# Patient Record
Sex: Female | Born: 1955 | Race: White | Hispanic: No | Marital: Married | Smoking: Former smoker
Health system: Southern US, Community
[De-identification: ages and names within clinical notes are randomized; demographics above are authoritative.]

## PROBLEM LIST (undated history)

## (undated) DIAGNOSIS — E039 Hypothyroidism, unspecified: Secondary | ICD-10-CM

## (undated) DIAGNOSIS — K219 Gastro-esophageal reflux disease without esophagitis: Secondary | ICD-10-CM

## (undated) DIAGNOSIS — E559 Vitamin D deficiency, unspecified: Secondary | ICD-10-CM

## (undated) DIAGNOSIS — I10 Essential (primary) hypertension: Secondary | ICD-10-CM

## (undated) HISTORY — DX: Essential (primary) hypertension: I10

## (undated) HISTORY — DX: Hypothyroidism, unspecified: E03.9

## (undated) HISTORY — DX: Gastro-esophageal reflux disease without esophagitis: K21.9

## (undated) HISTORY — PX: NO PAST SURGERIES: SHX2092

## (undated) HISTORY — DX: Vitamin D deficiency, unspecified: E55.9

---

## 1998-05-02 ENCOUNTER — Ambulatory Visit (HOSPITAL_COMMUNITY): Admission: RE | Admit: 1998-05-02 | Discharge: 1998-05-02 | Payer: Self-pay | Admitting: Otolaryngology

## 2005-05-14 ENCOUNTER — Ambulatory Visit: Payer: Self-pay | Admitting: Family Medicine

## 2005-06-23 ENCOUNTER — Other Ambulatory Visit: Admission: RE | Admit: 2005-06-23 | Discharge: 2005-06-23 | Payer: Self-pay | Admitting: Family Medicine

## 2005-06-23 ENCOUNTER — Ambulatory Visit: Payer: Self-pay | Admitting: Family Medicine

## 2006-07-21 ENCOUNTER — Ambulatory Visit: Payer: Self-pay | Admitting: Family Medicine

## 2006-07-21 ENCOUNTER — Encounter: Payer: Self-pay | Admitting: Family Medicine

## 2006-07-21 ENCOUNTER — Other Ambulatory Visit: Admission: RE | Admit: 2006-07-21 | Discharge: 2006-07-21 | Payer: Self-pay | Admitting: Family Medicine

## 2007-07-04 ENCOUNTER — Encounter: Payer: Self-pay | Admitting: Family Medicine

## 2007-07-04 DIAGNOSIS — J301 Allergic rhinitis due to pollen: Secondary | ICD-10-CM | POA: Insufficient documentation

## 2007-07-04 DIAGNOSIS — M797 Fibromyalgia: Secondary | ICD-10-CM

## 2007-07-04 DIAGNOSIS — M5137 Other intervertebral disc degeneration, lumbosacral region: Secondary | ICD-10-CM | POA: Insufficient documentation

## 2007-07-04 DIAGNOSIS — E039 Hypothyroidism, unspecified: Secondary | ICD-10-CM

## 2007-08-01 ENCOUNTER — Ambulatory Visit: Payer: Self-pay | Admitting: Family Medicine

## 2007-08-01 ENCOUNTER — Encounter: Payer: Self-pay | Admitting: Family Medicine

## 2007-08-01 ENCOUNTER — Other Ambulatory Visit: Admission: RE | Admit: 2007-08-01 | Discharge: 2007-08-01 | Payer: Self-pay | Admitting: Family Medicine

## 2007-08-02 LAB — CONVERTED CEMR LAB
ALT: 20 units/L (ref 0–35)
AST: 23 units/L (ref 0–37)
Albumin: 4.1 g/dL (ref 3.5–5.2)
Alkaline Phosphatase: 77 units/L (ref 39–117)
Basophils Relative: 0.6 % (ref 0.0–1.0)
Creatinine, Ser: 0.7 mg/dL (ref 0.4–1.2)
GFR calc non Af Amer: 93 mL/min
Glucose, Bld: 92 mg/dL (ref 70–99)
Hemoglobin: 14.4 g/dL (ref 12.0–15.0)
Lymphocytes Relative: 26.6 % (ref 12.0–46.0)
MCV: 90.6 fL (ref 78.0–100.0)
Monocytes Relative: 6.9 % (ref 3.0–11.0)
Potassium: 3.7 meq/L (ref 3.5–5.1)
RBC: 4.49 M/uL (ref 3.87–5.11)
RDW: 12.3 % (ref 11.5–14.6)
Sodium: 140 meq/L (ref 135–145)
Total Bilirubin: 0.8 mg/dL (ref 0.3–1.2)
Total CHOL/HDL Ratio: 2.7
VLDL: 23 mg/dL (ref 0–40)

## 2007-08-03 ENCOUNTER — Encounter (INDEPENDENT_AMBULATORY_CARE_PROVIDER_SITE_OTHER): Payer: Self-pay | Admitting: *Deleted

## 2007-08-08 ENCOUNTER — Telehealth: Payer: Self-pay | Admitting: Family Medicine

## 2007-08-17 ENCOUNTER — Telehealth: Payer: Self-pay | Admitting: Family Medicine

## 2008-02-16 ENCOUNTER — Telehealth: Payer: Self-pay | Admitting: Family Medicine

## 2008-07-25 ENCOUNTER — Ambulatory Visit: Payer: Self-pay | Admitting: Family Medicine

## 2008-07-30 LAB — CONVERTED CEMR LAB: T3 Uptake Ratio: 40.4 % — ABNORMAL HIGH (ref 22.5–37.0)

## 2009-07-24 ENCOUNTER — Ambulatory Visit: Payer: Self-pay | Admitting: Family Medicine

## 2009-07-26 ENCOUNTER — Encounter (INDEPENDENT_AMBULATORY_CARE_PROVIDER_SITE_OTHER): Payer: Self-pay | Admitting: *Deleted

## 2009-07-26 LAB — CONVERTED CEMR LAB
Direct LDL: 90.9 mg/dL
Free T4: 0.9 ng/dL (ref 0.6–1.6)
HDL: 57.3 mg/dL (ref >39.00)
TSH: 3.49 microintl units/mL (ref 0.35–5.50)

## 2009-10-30 ENCOUNTER — Ambulatory Visit: Payer: Self-pay | Admitting: Family Medicine

## 2009-10-30 LAB — CONVERTED CEMR LAB: Yeast, UA: 0

## 2009-11-22 ENCOUNTER — Ambulatory Visit: Payer: Self-pay | Admitting: Family Medicine

## 2010-07-23 ENCOUNTER — Ambulatory Visit: Payer: Self-pay | Admitting: Family Medicine

## 2010-07-23 LAB — CONVERTED CEMR LAB
ALT: 19 units/L (ref 0–35)
AST: 24 units/L (ref 0–37)
Basophils Absolute: 0.1 10*3/uL (ref 0.0–0.1)
Basophils Relative: 0.7 % (ref 0.0–3.0)
CO2: 31 meq/L (ref 19–32)
Calcium: 9.8 mg/dL (ref 8.4–10.5)
Creatinine, Ser: 0.9 mg/dL (ref 0.4–1.2)
Free T4: 0.75 ng/dL (ref 0.60–1.60)
GFR calc non Af Amer: 70.76 mL/min (ref >60)
Hemoglobin: 13.5 g/dL (ref 12.0–15.0)
MCHC: 34.3 g/dL (ref 30.0–36.0)
MCV: 91.3 fL (ref 78.0–100.0)
Monocytes Relative: 6.3 % (ref 3.0–12.0)
RBC: 4.33 M/uL (ref 3.87–5.11)

## 2010-09-11 ENCOUNTER — Ambulatory Visit: Payer: Self-pay | Admitting: Family Medicine

## 2010-09-11 ENCOUNTER — Encounter: Payer: Self-pay | Admitting: Family Medicine

## 2010-09-11 DIAGNOSIS — R002 Palpitations: Secondary | ICD-10-CM | POA: Insufficient documentation

## 2010-09-16 LAB — CONVERTED CEMR LAB
CO2: 29 meq/L (ref 19–32)
Cholesterol: 220 mg/dL — ABNORMAL HIGH (ref 0–200)
Creatinine, Ser: 0.8 mg/dL (ref 0.4–1.2)
Free T4: 0.98 ng/dL (ref 0.60–1.60)
Glucose, Bld: 90 mg/dL (ref 70–99)
HDL: 57.2 mg/dL (ref >39.00)
Lymphocytes Relative: 30.3 % (ref 12.0–46.0)
Lymphs Abs: 1.8 10*3/uL (ref 0.7–4.0)
MCHC: 34.8 g/dL (ref 30.0–36.0)
MCV: 91.4 fL (ref 78.0–100.0)
Monocytes Relative: 7.8 % (ref 3.0–12.0)
Platelets: 268 10*3/uL (ref 150.0–400.0)
RBC: 4.42 M/uL (ref 3.87–5.11)
RDW: 14.1 % (ref 11.5–14.6)
TSH: 4.7 microintl units/mL (ref 0.35–5.50)
Total CHOL/HDL Ratio: 4
Triglycerides: 218 mg/dL — ABNORMAL HIGH (ref 0.0–149.0)

## 2010-09-25 ENCOUNTER — Telehealth: Payer: Self-pay | Admitting: Family Medicine

## 2010-09-25 ENCOUNTER — Ambulatory Visit: Payer: Self-pay | Admitting: Family Medicine

## 2010-09-26 ENCOUNTER — Ambulatory Visit: Payer: Self-pay | Admitting: Family Medicine

## 2010-11-04 NOTE — Assessment & Plan Note (Signed)
Summary: ?UTI,SINUS INFECTION/CLE   Vital Signs:  Patient profile:   55 year old female Weight:      185 pounds Temp:     98.9 degrees F oral Pulse rate:   84 / minute Pulse rhythm:   regular BP sitting:   132 / 100  Vitals Entered By: Lowella Petties CMA (October 30, 2009 12:35 PM) CC: Burning with urination, lower abd pain.  Also has sinus, ear and chest pain.  Cough.   History of Present Illness: is having urinary and sinus symptoms   uti started sat am  burns at the end of urination- pelvic heaviness and pain  some frequency  used cystex -- did not help much  is drinking cranberry juice tried azo last night   uri symptoms  did tx sinusitis in oct -- and never got fully better  now soreness R side neck glands and R ear pain - on and off  then some "noise " in ear- and tinnitus got much worse  this am - some post nasal drip and painful cough  no fever- no chills or aches  is really tired  green and yellow and bloody nasal d/c -- ever since early dec -- got a little better and then worse cannot breathe out of her nose cough is dry/ no wheeze   walking 2 mi every day   Allergies: 1)  Codeine 2)  * Nasonex  Past History:  Past Medical History: Last updated: 07/25/2008 Hypothyroidism fibromyalgia  all rhinitis deg disk dz   Family History: Last updated: 18-Aug-2009 Father: deceased age 12- MI, ETOH Mother: HTN, RA B- thalessemia, glaucoma, depression, rare kidney disease sister - same rare kidney disease Siblings: sister with HTN, depression uncle RA aunts B ca aunt cerebral hemm sister breast cancer   Social History: Last updated: 08/01/2007 Marital Status: Married Children: 1 daughter Occupation:  Former Smoker  Risk Factors: Smoking Status: quit (08/01/2007)  Review of Systems General:  Complains of fatigue and malaise; denies chills and fever. Eyes:  Denies blurring and discharge. ENT:  Complains of earache, nasal congestion, postnasal  drainage, sinus pressure, and sore throat. CV:  Denies chest pain or discomfort, lightheadness, and palpitations. Resp:  Complains of cough and shortness of breath; denies wheezing. GI:  Denies abdominal pain, change in bowel habits, and nausea. GU:  Complains of dysuria and urinary frequency; denies hematuria. Derm:  Denies itching and rash.  Physical Exam  General:  overweight but generally well appearing  Head:  normocephalic, atraumatic, and no abnormalities observed.  bilat maxillary and ethmoid sinus tenderness worse on R Eyes:  vision grossly intact, pupils equal, pupils round, and pupils reactive to light.  mild conj injection Ears:  R ear normal and L ear normal.   Nose:  nares are congested and injected bilat  Mouth:  pharynx pink and moist, no erythema, and no exudates.   Neck:  No deformities, masses, or tenderness noted. Lungs:  Normal respiratory effort, chest expands symmetrically. Lungs are clear to auscultation, no crackles or wheezes. Heart:  Normal rate and regular rhythm. S1 and S2 normal without gallop, murmur, click, rub or other extra sounds. Abdomen:  mild suprapubic tenderness without rebound or gaurding Msk:  no CVA tenderness  Skin:  Intact without suspicious lesions or rashes Cervical Nodes:  No lymphadenopathy noted Inguinal Nodes:  No significant adenopathy Psych:  normal affect, talkative and pleasant    Impression & Recommendations:  Problem # 1:  CYSTITIS (ICD-595.9) Assessment New with voiding  symptoms  tx with levaquin and update  pt advised to update me if symptoms worsen or do not improve - esp back pain or fever  The following medications were removed from the medication list:    Augmentin 875-125 Mg Tabs (Amoxicillin-pot clavulanate) .Marland Kitchen... 1 by mouth two times a day for 10 days Her updated medication list for this problem includes:    Levaquin 500 Mg Tabs (Levofloxacin) .Marland Kitchen... 1 by mouth once daily for 10 days for uti and sinus  infection  Orders: Prescription Created Electronically [phone]) UA Dipstick W/ Micro (manual) (60454)  Problem # 2:  SINUSITIS - ACUTE-NOS (ICD-461.9) Assessment: New ? if ever resolved from fall with chronic congestion  will tx with levaquin  recommend sympt care- see pt instructions   if not imp in 10 d will plan on ent consult The following medications were removed from the medication list:    Augmentin 875-125 Mg Tabs (Amoxicillin-pot clavulanate) .Marland Kitchen... 1 by mouth two times a day for 10 days Her updated medication list for this problem includes:    Nasonex 50 Mcg/act Susp (Mometasone furoate) .Marland Kitchen... 2 sprays in each nostril once daily    Levaquin 500 Mg Tabs (Levofloxacin) .Marland Kitchen... 1 by mouth once daily for 10 days for uti and sinus infection  Complete Medication List: 1)  Synthroid 100 Mcg Tabs (Levothyroxine sodium) .... One by mouth once daily 2)  Multivitamins Tabs (Multiple vitamin) .... Daily 3)  Vitamin D 500mg   .... Two daily 4)  Nasonex 50 Mcg/act Susp (Mometasone furoate) .... 2 sprays in each nostril once daily 5)  Levaquin 500 Mg Tabs (Levofloxacin) .Marland Kitchen.. 1 by mouth once daily for 10 days for uti and sinus infection  Patient Instructions: 1)  continue drinking lots of water 2)  call or seek care is symptoms don't improve in 2-3 days or if you develop back pain, nausea, or vomiting  3)  take the levaquin as directed  4)  use nasal saline spray  5)  mucinex can help with congestion 6)  if sinus symptoms are not better in 10 days- call and I will refer you to ENT doctor  Prescriptions: LEVAQUIN 500 MG TABS (LEVOFLOXACIN) 1 by mouth once daily for 10 days for uti and sinus infection  #10 x 0   Entered and Authorized by:   Judith Part MD   Signed by:   Judith Part MD on 10/30/2009   Method used:   Electronically to        [facility]* (retail)       [address] [place], [place]  [address]        Ph: [phone] or [phone]       Fax: [phone]   RxID:   [id number]   Prior Medications (reviewed today): SYNTHROID 100 MCG  TABS (LEVOTHYROXINE SODIUM) one by mouth once daily MULTIVITAMINS  TABS (MULTIPLE VITAMIN) daily VITAMIN D 500MG  () Two daily NASONEX 50 MCG/ACT SUSP (MOMETASONE FUROATE) 2 sprays in each nostril once daily Current Allergies: CODEINE * NASONEX Laboratory Results   Urine Tests   Date/Time Reported: October 30, 2009 12:39 PM   Urine Microscopic WBC/HPF: many RBC/HPF: 1-2 Bacteria/HPF: many Mucous/HPF: few Epithelial/HPF: 1-3 Crystals/HPF: 0 Casts/LPF: 0 Yeast/HPF: 0 Other: 0    Comments: Pt is taking AZO, strip is too discolored to read.

## 2010-11-04 NOTE — Assessment & Plan Note (Signed)
Summary: CHECK THYROID/CLE   Vital Signs:  Patient profile:   55 year old female Height:      68 inches Weight:      189.75 pounds BMI:     28.96 Temp:     98.2 degrees F oral Pulse rate:   72 / minute Pulse rhythm:   regular BP sitting:   110 / 74  (left arm) Cuff size:   regular  Vitals Entered By: Lewanda Rife LPN (July 23, 2010 10:13 AM) CC: [facility] thyroid   History of Present Illness: here for visit f/u of hypothyroidism   wt is up 3 lb  bp good at 110/74  is working really hard at trying to walk and stay active   thinks her thyroid is lower  is loosing more hair  is extremely tired - for 2 months - wants to stay in bed (despite good motivation and mood) not depressed   fibro is a bit worse also   r elbow is a problem -- lateral elbow   r hand shakes over the past year  worse when she is anxious or tired  R arm has always been "weak"  is R handed   menopause in 2006 -- last menses then  takes estroven- that helps with the hot flashes      Allergies: 1)  Codeine 2)  * Nasonex  Past History:  Past Medical History: Last updated: 07/25/2008 Hypothyroidism fibromyalgia  all rhinitis deg disk dz   Family History: Last updated: 05-Aug-2009 Father: deceased age 21- MI, ETOH Mother: HTN, RA B- thalessemia, glaucoma, depression, rare kidney disease sister - same rare kidney disease Siblings: sister with HTN, depression uncle RA aunts B ca aunt cerebral hemm sister breast cancer   Social History: Last updated: 08/01/2007 Marital Status: Married Children: 1 daughter Occupation:  Former Smoker  Risk Factors: Smoking Status: quit (08/01/2007)  Review of Systems General:  Complains of fatigue; denies loss of appetite and malaise. Eyes:  Denies blurring and eye pain. CV:  Denies chest pain or discomfort, lightheadness, palpitations, shortness of breath with exertion, and swelling of feet. Resp:  Denies cough, shortness of breath, and  wheezing. GI:  Denies abdominal pain, change in bowel habits, indigestion, and nausea. GU:  Denies dysuria and urinary frequency. MS:  Complains of joint pain and joint swelling; denies joint redness, cramps, muscle weakness, and stiffness. Derm:  Denies itching, lesion(s), poor wound healing, and rash. Neuro:  Complains of tremors; denies numbness and tingling. Psych:  Denies anxiety and depression. Endo:  Denies cold intolerance, excessive thirst, excessive urination, and heat intolerance. Heme:  Denies abnormal bruising and bleeding.  Physical Exam  General:  overweight but generally well appearing  Head:  normocephalic, atraumatic, and no abnormalities observed.   Eyes:  vision grossly intact, pupils equal, pupils round, and pupils reactive to light.  no conjunctival pallor, injection or icterus  Ears:  R ear normal and L ear normal.   Nose:  no nasal discharge.   Mouth:  pharynx pink and moist.   Neck:  supple with full rom and no masses or thyromegally, no JVD or carotid bruit  Chest Wall:  No deformities, masses, or tenderness noted. Lungs:  Normal respiratory effort, chest expands symmetrically. Lungs are clear to auscultation, no crackles or wheezes. Heart:  Normal rate and regular rhythm. S1 and S2 normal without gallop, murmur, click, rub or other extra sounds. Abdomen:  Bowel sounds positive,abdomen soft and non-tender without masses, organomegaly or hernias noted. no  renal bruits  Msk:  R elbow- tender over lat epicondyle nl rom  no mass or swelling no crepitice or skin change   Pulses:  R and L carotid,radial,femoral,dorsalis pedis and posterior tibial pulses are full and equal bilaterally Extremities:  No clubbing, cyanosis, edema, or deformity noted with normal full range of motion of all joints.   Neurologic:  tremor in R hand when arm is extended grossly nl grip and strength in the arm Skin:  Intact without suspicious lesions or rashes no apparent patterned hair  loss/ allopecia seen Cervical Nodes:  No lymphadenopathy noted Inguinal Nodes:  No significant adenopathy Psych:  normal affect, talkative and pleasant    Impression & Recommendations:  Problem # 1:  FATIGUE (ICD-780.79) Assessment New with menopause playing role and poss hypothyroidism lab today enc to stay active and work on wt loss  Orders: Venipuncture (16109) TLB-TSH (Thyroid Stimulating Hormone) (84443-TSH) TLB-T4 (Thyrox), Free (60454-UJ8J) TLB-BMP (Basic Metabolic Panel-BMET) (80048-METABOL) TLB-CBC Platelet - w/Differential (85025-CBCD) TLB-Hepatic/Liver Function Pnl (80076-HEPATIC)  Problem # 2:  HYPOTHYROIDISM (ICD-244.9) Assessment: Deteriorated  withmore hair loss and fatigue lately check tsh and free t4 and update   Her updated medication list for this problem includes:    Synthroid 100 Mcg Tabs (Levothyroxine sodium) ..... One by mouth once daily  Orders: Venipuncture (19147) TLB-TSH (Thyroid Stimulating Hormone) (84443-TSH) TLB-T4 (Thyrox), Free [phone]) TLB-BMP (Basic Metabolic Panel-BMET) (80048-METABOL) TLB-CBC Platelet - w/Differential (85025-CBCD) TLB-Hepatic/Liver Function Pnl (80076-HEPATIC)  Labs Reviewed: TSH: 3.49 (07/24/2009)    Chol: 233 (07/24/2009)   HDL: 57.30 (07/24/2009)   LDL: 89 (08/01/2007)   TG: 197.0 (07/24/2009)  Problem # 3:  ELBOW PAIN, RIGHT (QMV-784.69) Assessment: New with subjective swelling and tenderness over lat epicondyle ? tendonitis/ epicondylitis also tremor in R hand - pt states is due to pain ref to sport med  Complete Medication List: 1)  Synthroid 100 Mcg Tabs (Levothyroxine sodium) .... One by mouth once daily 2)  Multivitamins Tabs (Multiple vitamin) .... Daily 3)  Vitamin D 500mg   .... Two daily 4)  Nasonex 50 Mcg/act Susp (Mometasone furoate) .... 2 sprays in each nostril once daily as needed 5)  Proair Hfa 108 (90 Base) Mcg/act Aers (Albuterol sulfate) .... 2 puffs up to every 4 hours as needed  wheezing or tight chest 6)  Estroven Tabs (Nutritional supplements) .... Two tablets by mouth daily  Patient Instructions: 1)  labs today for fatigue (including thyroid)  2)  keep working on exercise  3)  we will make appt with Dr Patsy Lager for elbow (at check out )  4)  will update you with results    Orders Added: 1)  Venipuncture [36415] 2)  TLB-TSH (Thyroid Stimulating Hormone) [84443-TSH] 3)  TLB-T4 (Thyrox), Free [62952-WU1L] 4)  TLB-BMP (Basic Metabolic Panel-BMET) [80048-METABOL] 5)  TLB-CBC Platelet - w/Differential [85025-CBCD] 6)  TLB-Hepatic/Liver Function Pnl [80076-HEPATIC] 7)  Est. Patient Level IV [24401]    Current Allergies (reviewed today): CODEINE * NASONEX

## 2010-11-04 NOTE — Assessment & Plan Note (Signed)
Summary: FOLLOW UP TO CK THYROID/RI   Vital Signs:  Patient profile:   55 year old female Height:      68 inches Weight:      188.75 pounds BMI:     28.80 Temp:     98 degrees F oral Pulse rate:   72 / minute Pulse rhythm:   regular BP sitting:   130 / 90  (left arm) Cuff size:   regular  Vitals Entered By: Lewanda Rife LPN (September 11, 2010 8:11 AM) CC: follow-up visit for thyroid   History of Present Illness: here for re check of thyroid   wt is down 1 lb  bp 130/90 on first check   tsh was elevated over 9 in oct dose was increased   thinks she feels a little better  not as tired as she did  hair is not coming out  skin cannot tell much diff -- some dryness in the winter   is having sinus problems  cough with clear nasal discharge -- coughed so bad that she is sore in her chest  also post nasal drip  it almost chokes her -- is very thick got over that -- and then notices at night her heart skips beats and runs fast  sometimes feels a little sob because she was scared  not stressed  during the day heart races some  no meds otc  does drink 1 cup of coffee every am    Allergies: 1)  Codeine 2)  * Nasonex  Past History:  Past Medical History: Last updated: 07/25/2008 Hypothyroidism fibromyalgia  all rhinitis deg disk dz   Family History: Last updated: 13-Aug-2009 Father: deceased age 34- MI, ETOH Mother: HTN, RA B- thalessemia, glaucoma, depression, rare kidney disease sister - same rare kidney disease Siblings: sister with HTN, depression uncle RA aunts B ca aunt cerebral hemm sister breast cancer   Social History: Last updated: 08/01/2007 Marital Status: Married Children: 1 daughter Occupation:  Former Smoker  Risk Factors: Smoking Status: quit (08/01/2007)  Review of Systems General:  Denies chills, fatigue, fever, loss of appetite, malaise, and weight loss. Eyes:  Denies blurring. ENT:  Complains of postnasal drainage; that is  improving. CV:  Complains of chest pain or discomfort and palpitations; denies shortness of breath with exertion, swelling of feet, and swelling of hands. Resp:  Denies cough, shortness of breath, and wheezing; cough is finally better. GI:  Denies abdominal pain, change in bowel habits, indigestion, nausea, and vomiting. GU:  Denies urinary frequency. MS:  Denies cramps and thoracic pain. Derm:  Complains of dryness; denies lesion(s), poor wound healing, and rash. Neuro:  Complains of tremors; denies headaches, numbness, and tingling; R hand has chronic unchanged tremor. Psych:  Denies anxiety and depression. Endo:  Denies cold intolerance, excessive thirst, excessive urination, and heat intolerance. Heme:  Denies abnormal bruising and bleeding.  Physical Exam  General:  overweight but generally well appearing  Head:  normocephalic, atraumatic, and no abnormalities observed.   Eyes:  vision grossly intact, pupils equal, pupils round, and pupils reactive to light.  no conjunctival pallor, injection or icterus  Mouth:  pharynx pink and moist.   Neck:  supple with full rom and no masses or thyromegally, no JVD or carotid bruit  Chest Wall:  tender to palp over entire ant cw no skin change or crepitice Lungs:  Normal respiratory effort, chest expands symmetrically. Lungs are clear to auscultation, no crackles or wheezes. Heart:  Normal rate and  regular rhythm. S1 and S2 normal without gallop, murmur, click, rub or other extra sounds. Abdomen:  Bowel sounds positive,abdomen soft and non-tender without masses, organomegaly or hernias noted.  no renal bruits  Msk:  No deformity or scoliosis noted of thoracic or lumbar spine.   Pulses:  R and L carotid,radial,femoral,dorsalis pedis and posterior tibial pulses are full and equal bilaterally Extremities:  no cce Neurologic:  tremor in R hand when arm is extended- this is baseline grossly nl grip and strength in the arm Skin:  Intact without  suspicious lesions or rashes Cervical Nodes:  No lymphadenopathy noted Psych:  pt seems mildly nervous today   Impression & Recommendations:  Problem # 1:  PALPITATIONS (ICD-785.1)  intermittent and worse at night in pt with 1 caff bev per day  also had thyroid dose inc recently lab today  ekg- no acute changes  Orders: Venipuncture (16109) TLB-Lipid Panel (80061-LIPID) TLB-BMP (Basic Metabolic Panel-BMET) (80048-METABOL) TLB-CBC Platelet - w/Differential (85025-CBCD) TLB-TSH (Thyroid Stimulating Hormone) (84443-TSH) TLB-T4 (Thyrox), Free (60454-UJ8J) EKG w/ Interpretation (93000)  Problem # 2:  HYPOTHYROIDISM (ICD-244.9) Assessment: Improved  overall clinical imp on new dose check tsh and free T4 today Her updated medication list for this problem includes:    Synthroid 125 Mcg Tabs (Levothyroxine sodium) .Marland Kitchen... 1 by mouth once daily  Labs Reviewed: TSH: 9.80 (07/23/2010)    Chol: 233 (07/24/2009)   HDL: 57.30 (07/24/2009)   LDL: 89 (08/01/2007)   TG: 197.0 (07/24/2009)  Orders: Venipuncture (19147) TLB-Lipid Panel (80061-LIPID) TLB-BMP (Basic Metabolic Panel-BMET) (80048-METABOL) TLB-CBC Platelet - w/Differential (85025-CBCD) TLB-TSH (Thyroid Stimulating Hormone) (84443-TSH) TLB-T4 (Thyrox), Free [phone])  Problem # 3:  CHEST WALL PAIN, ACUTE (QMV-784.69) Assessment: New following uri with severe cough that has since improved  some tenderness on exam rec warm compresses and watchful waiting/ analgesics prn - exp slow imp adv to update if worse or not imp in 1-2 wk  Problem # 4:  ELEVATED BLOOD PRESSURE WITHOUT DIAGNOSIS OF HYPERTENSION (ICD-796.2) Assessment: New  this is unusual for her and we need to watch it  pt does seem mildly nervous today will re check with nurse visit 2-4 wk disc diet and exercise   Orders: Venipuncture (62952) TLB-Lipid Panel (80061-LIPID) TLB-BMP (Basic Metabolic Panel-BMET) (80048-METABOL) TLB-CBC Platelet - w/Differential  (85025-CBCD) TLB-TSH (Thyroid Stimulating Hormone) (84443-TSH) TLB-T4 (Thyrox), Free [phone])  Complete Medication List: 1)  Synthroid 125 Mcg Tabs (Levothyroxine sodium) .Marland Kitchen.. 1 by mouth once daily 2)  Multivitamins Tabs (Multiple vitamin) .... Daily 3)  Vitamin D 500mg   .... Two daily 4)  Nasonex 50 Mcg/act Susp (Mometasone furoate) .... 2 sprays in each nostril once daily as needed 5)  Proair Hfa 108 (90 Base) Mcg/act Aers (Albuterol sulfate) .... 2 puffs up to every 4 hours as needed wheezing or tight chest 6)  Estroven Tabs (Nutritional supplements) .... Two tablets by mouth daily  Patient Instructions: 1)  thyroid labs today  2)  will advise when that returns  3)  no caffiene- change to decaf in ams  4)  if all nl and your symptoms persist -- we will refer to cardiology  5)  for chest soreness- warm compresses and tylenol as needed  6)  follow up for nurse visit in 2-4 weeks for a blood pressure check please   Orders Added: 1)  Venipuncture [36415] 2)  TLB-Lipid Panel [80061-LIPID] 3)  TLB-BMP (Basic Metabolic Panel-BMET) [80048-METABOL] 4)  TLB-CBC Platelet - w/Differential [85025-CBCD] 5)  TLB-TSH (Thyroid Stimulating Hormone) [84443-TSH] 6)  TLB-T4 (  Thyrox), Free [63016-WF0X] 7)  Est. Patient Level IV [32355] 8)  EKG w/ Interpretation [93000]    Current Allergies (reviewed today): CODEINE * NASONEX   EKG  Procedure date:  09/11/2010  Findings:      NSR with rate of 71 computer reads poor R wave progression -- is very mild from my obs

## 2010-11-04 NOTE — Assessment & Plan Note (Signed)
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 - stayed in bed 2 d with severe cough  productive cough - junky and hard to breathe  was on abx for sinusitis -- and this did really help quite a bit  now still having trouble getting breath  R ear is popping and cracking  had a mild sinus headache chest is generally sore   cough is overal much better   no hx of asthma or inhaler use   no fever   Allergies: 1)  Codeine 2)  * Nasonex  Past History:  Past Medical History: Last updated: 07/25/2008 Hypothyroidism fibromyalgia  all rhinitis deg disk dz   Family History: Last updated: 05-Aug-2009 Father: deceased age 76- MI, ETOH Mother: HTN, RA B- thalessemia, glaucoma, depression, rare kidney disease sister - same rare kidney disease Siblings: sister with HTN, depression uncle RA aunts B ca aunt cerebral hemm sister breast cancer   Social History: Last updated: 08/01/2007 Marital Status: Married Children: 1 daughter Occupation:  Former Smoker  Risk Factors: Smoking Status: quit (08/01/2007)  Review of Systems General:  Complains of fatigue; denies chills and fever. Eyes:  Denies blurring and discharge. ENT:  Complains of nasal congestion and postnasal drainage; denies sore throat. CV:  Denies chest pain or discomfort and palpitations. Resp:  Complains of cough, pleuritic, and sputum productive; denies wheezing. GI:  Denies abdominal pain and bloody stools. Derm:  Denies rash.  Physical Exam  General:  Well-developed,well-nourished,in no acute distress; alert,appropriate and cooperative  throughout examination Head:  normocephalic, atraumatic, and no abnormalities observed.  no sinus tenderness Eyes:  vision grossly intact, pupils equal, pupils round, pupils reactive to light, and no injection.   Ears:  R ear normal and L ear normal.   Nose:  nares are injected and congested bilat  Mouth:  pharynx pink and moist, no erythema, and no exudates.   Neck:  supple with full rom and no masses or thyromegally, no JVD or carotid bruit  Chest Wall:  No deformities, masses, or tenderness noted. Lungs:  CTA with harsh bs at bases - no rales or rhonchi scant wheeze on forced exp only  Heart:  Normal rate and regular rhythm. S1 and S2 normal without gallop, murmur, click, rub or other extra sounds. Skin:  Intact without suspicious lesions or rashes Cervical Nodes:  No lymphadenopathy noted Psych:  normal affect, talkative and pleasant    Impression & Recommendations:  Problem # 1:  BRONCHITIS- ACUTE (ICD-466.0) Assessment New s/p uri and sinusitis with residual mild reactive airways  will tx with proair and update pt declined prednisone - but warned if wheezing inc she may need it (voiced understanding) recommend sympt care- see pt instructions   pt advised to update me if symptoms worsen or do not improve - esp if prod cough or fever  The following medications were removed from the medication list:    Levaquin 500 Mg Tabs (Levofloxacin) .Marland Kitchen... 1 by mouth once daily for  10 days for uti and sinus infection Her updated medication list for this problem includes:    Proair Hfa 108 (90 Base) Mcg/act Aers (Albuterol sulfate) .Marland Kitchen... 2 puffs up to every 4 hours as needed wheezing or tight chest  Complete Medication List: 1)  Synthroid 100 Mcg Tabs (Levothyroxine sodium) .... One by mouth once daily 2)  Multivitamins Tabs (Multiple vitamin) .... Daily 3)  Vitamin D 500mg   .... Two daily 4)  Nasonex 50 Mcg/act Susp (Mometasone furoate) .... 2 sprays in each nostril once daily as needed 5)   Proair Hfa 108 (90 Base) Mcg/act Aers (Albuterol sulfate) .... 2 puffs up to every 4 hours as needed wheezing or tight chest  Patient Instructions: 1)  continue nasonex and nasal saline spray  2)  drink lots of water and get rest  3)  use proair inhaler up to every 4 hours as needed 4)  if not improved in 1-2 weeks let me know  5)  if worse or fever let me know  Prescriptions: PROAIR HFA 108 (90 BASE) MCG/ACT AERS (ALBUTEROL SULFATE) 2 puffs up to every 4 hours as needed wheezing or tight chest  #1 mdi x 1   Entered and Authorized by:   Judith Part MD   Signed by:   Judith Part MD on 11/22/2009   Method used:   Electronically to        [facility]* (retail)       [address] [place], [place]  [address]       Ph: [phone] or [phone]       Fax: [phone]   RxID:   [id number]   Current Allergies (reviewed today): CODEINE * NASONEX

## 2010-11-06 NOTE — Progress Notes (Signed)
Summary: want chest x-ray  Phone Note Call from Patient Call back at Home Phone [phone]   Caller: Patient Call For: Judith Part MD Summary of Call: pt came in today for BP check and was also complaining of chest pain and pressure on chest, pt would like to have a chest x-ray to ease her mind, pt states she thinks she coughed and maybe cracked a rib or pulled a muscle, pt has EKG which was normal, pain is worse on the right side , please advise if pt can have chest x-ray, pt would like this tomorrow if possible. Initial call taken by: Mervin Hack CMA Duncan Dull),  September 25, 2010 11:15 AM  Follow-up for Phone Call        I need to order both cxr and rib films for this -- will specify rib films on the R since that is where her pain is  schedule her for tomorrow for x ray please -- and when she gets here I will do the orders if worse cough/ fever or other symptoms please alert me  Follow-up by: Judith Part MD,  September 25, 2010 12:26 PM  Additional Follow-up for Phone Call Additional follow up Details #1::        Left message for patient to call back. Lewanda Rife LPN  September 25, 2010 12:54 PM  Scheduled pt for x-rays tomorrow at 10:00.         Lowella Petties CMA, AAMA  September 25, 2010 2:25 PM  Additional Follow-up by: Lowella Petties CMA, AAMA,  September 25, 2010 2:25 PM

## 2010-11-06 NOTE — Assessment & Plan Note (Signed)
Summary: TOWER 2-4 WEEK BLOOD PRESSURE CHECK/[facility]   Nurse Visit   Vital Signs:  Patient profile:   55 year old female O2 Sat:      98 % on Room air Pulse rate:   79 / minute Pulse rhythm:   regular BP sitting:   127 / 85  (left arm) Cuff size:   regular  Vitals Entered By: Mervin Hack CMA Duncan Dull) (September 25, 2010 11:08 AM)  O2 Flow:  Room air CC: patient present today for blood pressure check at the request of the physican   Allergies: 1)  Codeine 2)  * Nasonex  Orders Added: 1)  Est. Patient Level I [01027]  Appended Document: TOWER 2-4 WEEK BLOOD PRESSURE CHECK/[facility] blood pressure is better  x rays ordered as requested   Appended Document: TOWER 2-4 WEEK BLOOD PRESSURE CHECK/[facility] Patient notified.

## 2012-03-31 ENCOUNTER — Ambulatory Visit (HOSPITAL_COMMUNITY)
Admission: RE | Admit: 2012-03-31 | Discharge: 2012-03-31 | Disposition: A | Discharge status: Home / Self Care | Payer: BC Managed Care – PPO | Source: Ambulatory Visit | Attending: Internal Medicine | Admitting: Internal Medicine

## 2012-03-31 ENCOUNTER — Other Ambulatory Visit (HOSPITAL_COMMUNITY): Payer: Self-pay | Admitting: Internal Medicine

## 2012-03-31 DIAGNOSIS — M25579 Pain in unspecified ankle and joints of unspecified foot: Secondary | ICD-10-CM

## 2013-08-21 ENCOUNTER — Encounter: Payer: Self-pay | Admitting: Internal Medicine

## 2013-08-21 DIAGNOSIS — E559 Vitamin D deficiency, unspecified: Secondary | ICD-10-CM

## 2013-08-22 ENCOUNTER — Encounter: Payer: Self-pay | Admitting: Emergency Medicine

## 2013-08-22 ENCOUNTER — Ambulatory Visit: Payer: BC Managed Care – PPO | Admitting: Emergency Medicine

## 2013-08-22 VITALS — BP 134/82 | HR 86 | Temp 98.6°F | Resp 18 | Ht 68.75 in | Wt 190.0 lb

## 2013-08-22 DIAGNOSIS — E039 Hypothyroidism, unspecified: Secondary | ICD-10-CM

## 2013-08-22 DIAGNOSIS — E782 Mixed hyperlipidemia: Secondary | ICD-10-CM

## 2013-08-22 DIAGNOSIS — R030 Elevated blood-pressure reading, without diagnosis of hypertension: Secondary | ICD-10-CM

## 2013-08-22 DIAGNOSIS — K219 Gastro-esophageal reflux disease without esophagitis: Secondary | ICD-10-CM

## 2013-08-22 DIAGNOSIS — J329 Chronic sinusitis, unspecified: Secondary | ICD-10-CM

## 2013-08-22 DIAGNOSIS — E559 Vitamin D deficiency, unspecified: Secondary | ICD-10-CM

## 2013-08-22 LAB — CBC WITH DIFFERENTIAL/PLATELET
Eosinophils Absolute: 0.1 10*3/uL (ref 0.0–0.7)
Eosinophils Relative: 1 % (ref 0–5)
HCT: 39.9 % (ref 36.0–46.0)
Lymphocytes Relative: 30 % (ref 12–46)
Lymphs Abs: 2.3 10*3/uL (ref 0.7–4.0)
MCH: 31 pg (ref 26.0–34.0)
MCV: 88.5 fL (ref 78.0–100.0)
Monocytes Absolute: 0.6 10*3/uL (ref 0.1–1.0)
Monocytes Relative: 7 % (ref 3–12)
Platelets: 286 10*3/uL (ref 150–400)
RBC: 4.51 MIL/uL (ref 3.87–5.11)
WBC: 7.8 10*3/uL (ref 4.0–10.5)

## 2013-08-22 MED ORDER — OMEPRAZOLE 40 MG PO CPDR: 40.0000 mg | DELAYED_RELEASE_CAPSULE | Freq: Every day | ORAL | Status: DC

## 2013-08-22 MED ORDER — PREDNISONE 10 MG PO TABS: ORAL_TABLET | ORAL | Status: DC

## 2013-08-22 MED ORDER — AZITHROMYCIN 250 MG PO TABS: ORAL_TABLET | ORAL | Status: AC

## 2013-08-22 NOTE — Patient Instructions (Signed)
Cholesterol Cholesterol is a type of fat. Your body needs a small amount of cholesterol, but too much can cause health problems. Certain problems include heart attacks, strokes, and not enough blood flow to your heart, brain, kidneys, or feet. You get cholesterol in 2 ways:  Naturally.  By eating certain foods. HOME CARE  Eat a low-fat diet:  Eat less eggs, whole dairy products (whole milk, cheese, and butter), fatty meats, and fried foods.  Eat more fruits, vegetables, whole-wheat breads, lean chicken, and fish.  Follow your exercise program as told by your doctor.  Keep your weight at a healthy level. Talk to your doctor about what is right for you.  Only take medicine as told by your doctor.  Get your cholesterol checked once a year or as told by your doctor. MAKE SURE YOU:  Understand these instructions.  Will watch your condition.  Will get help right away if you are not doing well or get worse. Document Released: 12/18/2008 Document Revised: 12/14/2011 Document Reviewed: 12/18/2008 [facility] Patient Information 2014 [place], [place]. Allergic Rhinitis Allergic rhinitis is when the mucous membranes in the nose respond to allergens. Allergens are particles in the air that cause your body to have an allergic reaction. This causes you to release allergic antibodies. Through a chain of events, these eventually cause you to release histamine into the blood stream (hence the use of antihistamines). Although meant to be protective to the body, it is this release that causes your discomfort, such as frequent sneezing, congestion and an itchy runny nose.  CAUSES  The pollen allergens may come from grasses, trees, and weeds. This is seasonal allergic rhinitis, or "hay fever." Other allergens cause year-round allergic rhinitis (perennial allergic rhinitis) such as house dust mite allergen, pet dander and mold spores.  SYMPTOMS   Nasal stuffiness (congestion).  Runny, itchy nose with  sneezing and tearing of the eyes.  There is often an itching of the mouth, eyes and ears. It cannot be cured, but it can be controlled with medications. DIAGNOSIS  If you are unable to determine the offending allergen, skin or blood testing may find it. TREATMENT   Avoid the allergen.  Medications and allergy shots (immunotherapy) can help.  Hay fever may often be treated with antihistamines in pill or nasal spray forms. Antihistamines block the effects of histamine. There are over-the-counter medicines that may help with nasal congestion and swelling around the eyes. Check with your caregiver before taking or giving this medicine. If the treatment above does not work, there are many new medications your caregiver can prescribe. Stronger medications may be used if initial measures are ineffective. Desensitizing injections can be used if medications and avoidance fails. Desensitization is when a patient is given ongoing shots until the body becomes less sensitive to the allergen. Make sure you follow up with your caregiver if problems continue. SEEK MEDICAL CARE IF:   You develop fever (more than 100.5 F (38.1 C).  You develop a cough that does not stop easily (persistent).  You have shortness of breath.  You start wheezing.  Symptoms interfere with normal daily activities. Document Released: 06/16/2001 Document Revised: 12/14/2011 Document Reviewed: 12/26/2008 [facility] Patient Information 2014 [place], [place]. Sinusitis Sinusitis is redness, soreness, and puffiness (inflammation) of the air pockets in the bones of your face (sinuses). The redness, soreness, and puffiness can cause air and mucus to get trapped in your sinuses. This can allow germs to grow and cause an infection.  HOME CARE   Drink  enough fluids to keep your pee (urine) clear or pale yellow.  Use a humidifier in your home.  Run a hot shower to create steam in the bathroom. Sit in the bathroom with the door closed.  Breathe in the steam 3 4 times a day.  Put a warm, moist washcloth on your face 3 4 times a day, or as told by your doctor.  Use salt water sprays (saline sprays) to wet the thick fluid in your nose. This can help the sinuses drain.  Only take medicine as told by your doctor. GET HELP RIGHT AWAY IF:   Your pain gets worse.  You have very bad headaches.  You are sick to your stomach (nauseous).  You throw up (vomit).  You are very sleepy (drowsy) all the time.  Your face is puffy (swollen).  Your vision changes.  You have a stiff neck.  You have trouble breathing. MAKE SURE YOU:   Understand these instructions.  Will watch your condition.  Will get help right away if you are not doing well or get worse. Document Released: 03/09/2008 Document Revised: 06/15/2012 Document Reviewed: 04/26/2012 [facility] Patient Information 2014 [place], [place].

## 2013-08-22 NOTE — Progress Notes (Signed)
  Subjective:    Patient ID: Denise Jensen, female    DOB: 1956-10-03, 57 y.o.   MRN: [id number]  HPI Comments: 57 yo female noncompliant with cholesterol, Vit D f/u presents with increased sinus pressure over last several weeks on right side. She was treated for severe vertigo 08/03/13 which has improved. She has chronic ringing in ears but has increased over last several days. She has not been eating healthy or exercising due to multiple family stressors. She did not add any of the vitamins AD at last labs.   Hyperlipidemia  Headache  Associated symptoms include sinus pressure and tinnitus.  Sinusitis Associated symptoms include headaches and sinus pressure.  Sore Throat  Associated symptoms include headaches.    Current Outpatient Prescriptions on File Prior to Visit  Medication Sig Dispense Refill  . fish oil-omega-3 fatty acids 1000 MG capsule Take 2 g by mouth daily.      Marland Kitchen levothyroxine (SYNTHROID, LEVOTHROID) 75 MCG tablet Take 75 mcg by mouth daily before breakfast. 1 and 1/2 qd       No current facility-administered medications on file prior to visit.   ALLERGIES Codeine and Mometasone furoate   Past Medical History  Diagnosis Date  . Unspecified vitamin D deficiency   . GERD (gastroesophageal reflux disease)   . Unspecified hypothyroidism     Review of Systems  HENT: Positive for sinus pressure and tinnitus.   Neurological: Positive for headaches.  All other systems reviewed and are negative.    BP 134/82  Pulse 86  Temp(Src) 98.6 F (37 C) (Temporal)  Resp 18  Ht 5' 8.75" (1.746 m)  Wt 190 lb (86.183 kg)  BMI 28.27 kg/m2     Objective:   Physical Exam  Nursing note and vitals reviewed. Constitutional: She is oriented to person, place, and time. She appears well-developed and well-nourished.  HENT:  Head: Normocephalic and atraumatic.  Right Ear: External ear normal.  Left Ear: External ear normal.  Nose: Nose normal.  Mouth/Throat: Oropharynx  is clear and moist.  Right maxillary tenderness Bilateral ear canals dry cerumen obstructing TMs  Eyes: Pupils are equal, round, and reactive to light.  Neck: Normal range of motion. Neck supple. No thyromegaly present.  Cardiovascular: Normal rate, regular rhythm, normal heart sounds and intact distal pulses.   Pulmonary/Chest: Effort normal and breath sounds normal.  Abdominal: Soft. Bowel sounds are normal.  Musculoskeletal: Normal range of motion.  Lymphadenopathy:    She has no cervical adenopathy.  Neurological: She is alert and oriented to person, place, and time.  Skin: Skin is warm and dry.  Psychiatric: She has a normal mood and affect. Judgment normal.          Assessment & Plan:  1. Noncompliant cholesterol, TSH check labs 2. Elevated BP- check if >130/80 w/c, try to decrease stress with increased exercise and better diet 3. Sinusitis Zpak, Pred 10 mg DP, Allegra, Nasacort, Mucinex AD, increase H2o

## 2013-08-23 LAB — BASIC METABOLIC PANEL WITH GFR
BUN: 13 mg/dL (ref 6–23)
CO2: 28 mEq/L (ref 19–32)
Calcium: 9.2 mg/dL (ref 8.4–10.5)
Chloride: 102 mEq/L (ref 96–112)
Creat: 0.8 mg/dL (ref 0.50–1.10)
GFR, Est African American: >89 mL/min
GFR, Est Non African American: 82 mL/min
Glucose, Bld: 98 mg/dL (ref 70–99)

## 2013-08-23 LAB — LIPID PANEL
Cholesterol: 228 mg/dL — ABNORMAL HIGH (ref 0–200)
HDL: 49 mg/dL (ref >39)
Total CHOL/HDL Ratio: 4.7 Ratio
Triglycerides: 366 mg/dL — ABNORMAL HIGH (ref <150)
VLDL: 73 mg/dL — ABNORMAL HIGH (ref 0–40)

## 2013-08-23 LAB — HEPATIC FUNCTION PANEL
AST: 16 U/L (ref 0–37)
Albumin: 4.2 g/dL (ref 3.5–5.2)
Total Bilirubin: 0.3 mg/dL (ref 0.3–1.2)
Total Protein: 7.2 g/dL (ref 6.0–8.3)

## 2013-08-23 LAB — VITAMIN D 25 HYDROXY (VIT D DEFICIENCY, FRACTURES): Vit D, 25-Hydroxy: 37 ng/mL (ref 30–89)

## 2013-08-23 LAB — MAGNESIUM: Magnesium: 1.9 mg/dL (ref 1.5–2.5)

## 2013-10-31 ENCOUNTER — Other Ambulatory Visit: Payer: Self-pay | Admitting: Internal Medicine

## 2013-11-28 ENCOUNTER — Ambulatory Visit: Payer: Self-pay | Admitting: Emergency Medicine

## 2013-12-11 ENCOUNTER — Encounter: Payer: Self-pay | Admitting: Emergency Medicine

## 2013-12-11 ENCOUNTER — Ambulatory Visit (INDEPENDENT_AMBULATORY_CARE_PROVIDER_SITE_OTHER): Payer: BC Managed Care – PPO | Admitting: Emergency Medicine

## 2013-12-11 VITALS — BP 124/86 | HR 74 | Temp 97.8°F | Resp 18 | Ht 68.75 in | Wt 193.0 lb

## 2013-12-11 DIAGNOSIS — H6120 Impacted cerumen, unspecified ear: Secondary | ICD-10-CM

## 2013-12-11 DIAGNOSIS — E039 Hypothyroidism, unspecified: Secondary | ICD-10-CM

## 2013-12-11 DIAGNOSIS — R5383 Other fatigue: Secondary | ICD-10-CM

## 2013-12-11 DIAGNOSIS — J309 Allergic rhinitis, unspecified: Secondary | ICD-10-CM

## 2013-12-11 DIAGNOSIS — I10 Essential (primary) hypertension: Secondary | ICD-10-CM

## 2013-12-11 DIAGNOSIS — R5381 Other malaise: Secondary | ICD-10-CM

## 2013-12-11 DIAGNOSIS — E782 Mixed hyperlipidemia: Secondary | ICD-10-CM

## 2013-12-11 DIAGNOSIS — Z79899 Other long term (current) drug therapy: Secondary | ICD-10-CM

## 2013-12-11 DIAGNOSIS — E559 Vitamin D deficiency, unspecified: Secondary | ICD-10-CM

## 2013-12-11 LAB — CBC WITH DIFFERENTIAL/PLATELET
BASOS ABS: 0.1 10*3/uL (ref 0.0–0.1)
BASOS PCT: 1 % (ref 0–1)
EOS PCT: 1 % (ref 0–5)
Eosinophils Absolute: 0.1 10*3/uL (ref 0.0–0.7)
HCT: 40.2 % (ref 36.0–46.0)
Hemoglobin: 13.7 g/dL (ref 12.0–15.0)
LYMPHS PCT: 30 % (ref 12–46)
Lymphs Abs: 2.3 10*3/uL (ref 0.7–4.0)
MCH: 30.2 pg (ref 26.0–34.0)
MCHC: 34.1 g/dL (ref 30.0–36.0)
MCV: 88.5 fL (ref 78.0–100.0)
Monocytes Absolute: 0.4 10*3/uL (ref 0.1–1.0)
Monocytes Relative: 5 % (ref 3–12)
Neutro Abs: 4.9 10*3/uL (ref 1.7–7.7)
Neutrophils Relative %: 63 % (ref 43–77)
Platelets: 280 10*3/uL (ref 150–400)
RBC: 4.54 MIL/uL (ref 3.87–5.11)
RDW: 14.6 % (ref 11.5–15.5)
WBC: 7.8 10*3/uL (ref 4.0–10.5)

## 2013-12-11 LAB — IRON AND TIBC
%SAT: 24 % (ref 20–55)
Iron: 80 ug/dL (ref 42–145)
TIBC: 337 ug/dL (ref 250–470)
UIBC: 257 ug/dL (ref 125–400)

## 2013-12-11 LAB — BASIC METABOLIC PANEL WITH GFR
BUN: 12 mg/dL (ref 6–23)
CO2: 29 mEq/L (ref 19–32)
Calcium: 9.4 mg/dL (ref 8.4–10.5)
Chloride: 100 mEq/L (ref 96–112)
Creat: 0.93 mg/dL (ref 0.50–1.10)
GFR, Est African American: 78 mL/min
GFR, Est Non African American: 68 mL/min
GLUCOSE: 100 mg/dL — AB (ref 70–99)
Potassium: 4.1 mEq/L (ref 3.5–5.3)
Sodium: 136 mEq/L (ref 135–145)

## 2013-12-11 LAB — LIPID PANEL
CHOL/HDL RATIO: 4.4 ratio
Cholesterol: 250 mg/dL — ABNORMAL HIGH (ref 0–200)
HDL: 57 mg/dL (ref >39)
LDL Cholesterol: 145 mg/dL — ABNORMAL HIGH (ref 0–99)
Triglycerides: 242 mg/dL — ABNORMAL HIGH (ref <150)
VLDL: 48 mg/dL — ABNORMAL HIGH (ref 0–40)

## 2013-12-11 LAB — HEPATIC FUNCTION PANEL
ALT: 16 U/L (ref 0–35)
AST: 21 U/L (ref 0–37)
Albumin: 4.3 g/dL (ref 3.5–5.2)
Alkaline Phosphatase: 95 U/L (ref 39–117)
BILIRUBIN DIRECT: 0.1 mg/dL (ref 0.0–0.3)
BILIRUBIN INDIRECT: 0.3 mg/dL (ref 0.2–1.2)
BILIRUBIN TOTAL: 0.4 mg/dL (ref 0.2–1.2)
Total Protein: 7.4 g/dL (ref 6.0–8.3)

## 2013-12-11 LAB — MAGNESIUM: MAGNESIUM: 2 mg/dL (ref 1.5–2.5)

## 2013-12-11 NOTE — Progress Notes (Signed)
Subjective:    Patient ID: Denise Jensen, female    DOB: [birth year], 58 y.o.   MRN: [id number]  HPI Comments: 58 yo female for f/u cholesterol. She has increased fatigued with stress. She has not been exercising. She has not been eating healthy. She notes + snoring. She is trying to lose weight.  CHOL           228   08/22/2013 HDL             49   08/22/2013 LDLCALC        106   08/22/2013 LDLDIRECT     75.3   09/11/2010 TRIG           366   08/22/2013 CHOLHDL        4.7   08/22/2013 ALT           14   08/22/2013 AST           16   08/22/2013 ALKPHOS       99   08/22/2013 BILITOT      0.3   08/22/2013 CREATININE     0.80   08/22/2013 BUN              13   08/22/2013 NA              139   08/22/2013 K               4.0   08/22/2013 CL              102   08/22/2013 CO2              28   08/22/2013 TSH    0.865   08/22/2013 B12 315, VIT D 37  She notes congestion worse in left ear. She tried nose spray in past but doesn't like to use them. She has been using Allegra AD. She notes no relief with OTC debrox at home.   Current Outpatient Prescriptions on File Prior to Visit  Medication Sig Dispense Refill  . omeprazole (PRILOSEC) 40 MG capsule Take 1 capsule (40 mg total) by mouth daily.  90 capsule  1   No current facility-administered medications on file prior to visit.   Allergies  Allergen Reactions  . Codeine     REACTION: hallucinations  . Mometasone Furoate     REACTION: headache   Past Medical History  Diagnosis Date  . Unspecified vitamin D deficiency   . GERD (gastroesophageal reflux disease)   . Unspecified hypothyroidism       Review of Systems  Constitutional: Positive for fatigue.  HENT: Positive for congestion, ear pain and postnasal drip.   All other systems reviewed and are negative.   BP 124/86  Pulse 74  Temp(Src) 97.8 F (36.6 C) (Temporal)  Resp 18  Ht 5' 8.75" (1.746 m)  Wt 193 lb (87.544 kg)  BMI 28.72 kg/m2     Objective:   Physical Exam  Nursing note and vitals reviewed. Constitutional: She is oriented to person, place, and time. She appears well-developed and well-nourished. No distress.  HENT:  Head: Normocephalic and atraumatic.  Right Ear: External ear normal.  Left Ear: External ear normal.  Nose: Nose normal.  Mouth/Throat: Oropharynx is clear and moist. No oropharyngeal exudate.  Left ear obstructed soft wax, cleared with lavage. Cloudy TM's bilaterally Large tongue mildly obstructs airway  Eyes: Conjunctivae and EOM are normal.  Neck: Normal range of motion. Neck supple. No JVD  present. No thyromegaly present.  Cardiovascular: Normal rate, regular rhythm, normal heart sounds and intact distal pulses.   Pulmonary/Chest: Effort normal and breath sounds normal.  Abdominal: Soft. Bowel sounds are normal. She exhibits no distension and no mass. There is no tenderness. There is no rebound and no guarding.  Musculoskeletal: Normal range of motion. She exhibits no edema and no tenderness.  Lymphadenopathy:    She has no cervical adenopathy.  Neurological: She is alert and oriented to person, place, and time. No cranial nerve deficit.  Skin: Skin is warm and dry. No rash noted. No erythema. No pallor.  Psychiatric: She has a normal mood and affect. Her behavior is normal. Judgment and thought content normal.          Assessment & Plan:  1. Cholesterol- recheck labs, Need to eat healthier and exercise AD.  2. Hypothyroid vs Fatigue- check labs, increase activity and H2O vs sleep disturbance. SHe declines sleep study. Sleep hygiene explained. VS STRESS, advised counseling. w/c if SX increase or ER.  3. Allergic rhinitis- Allegra OTC, increase H2o, allergy hygiene explained. Restart nose spray AD if no relief refer ENT  4. Cerumen impaction- Ear lavage performed with good results.

## 2013-12-11 NOTE — Patient Instructions (Signed)
Cerumen Impaction A cerumen impaction is when the wax in your ear forms a plug. This plug usually causes reduced hearing. Sometimes it also causes an earache or dizziness. Removing a cerumen impaction can be difficult and painful. The wax sticks to the ear canal. The canal is sensitive and bleeds easily. If you try to remove a heavy wax buildup with a cotton tipped swab, you may push it in further. Irrigation with water, suction, and small ear curettes may be used to clear out the wax. If the impaction is fixed to the skin in the ear canal, ear drops may be needed for a few days to loosen the wax. People who build up a lot of wax frequently can use ear wax removal products available in your local drugstore. SEEK MEDICAL CARE IF:  You develop an earache, increased hearing loss, or marked dizziness. Document Released: 10/29/2004 Document Revised: 12/14/2011 Document Reviewed: 12/19/2009 [facility] Patient Information 2014 [place], [place]. Fat and Cholesterol Control Diet Fat and cholesterol levels in your blood and organs are influenced by your diet. High levels of fat and cholesterol may lead to diseases of the heart, small and large blood vessels, gallbladder, liver, and pancreas. CONTROLLING FAT AND CHOLESTEROL WITH DIET Although exercise and lifestyle factors are important, your diet is key. That is because certain foods are known to raise cholesterol and others to lower it. The goal is to balance foods for their effect on cholesterol and more importantly, to replace saturated and trans fat with other types of fat, such as monounsaturated fat, polyunsaturated fat, and omega-3 fatty acids. On average, a person should consume no more than 15 to 17 g of saturated fat daily. Saturated and trans fats are considered "bad" fats, and they will raise LDL cholesterol. Saturated fats are primarily found in animal products such as meats, butter, and cream. However, that does not mean you need to give up all your  favorite foods. Today, there are good tasting, low-fat, low-cholesterol substitutes for most of the things you like to eat. Choose low-fat or nonfat alternatives. Choose round or loin cuts of red meat. These types of cuts are lowest in fat and cholesterol. Chicken (without the skin), fish, veal, and ground [place] breast are great choices. Eliminate fatty meats, such as hot dogs and salami. Even shellfish have little or no saturated fat. Have a 3 oz (85 g) portion when you eat lean meat, poultry, or fish. Trans fats are also called "partially hydrogenated oils." They are oils that have been scientifically manipulated so that they are solid at room temperature resulting in a longer shelf life and improved taste and texture of foods in which they are added. Trans fats are found in stick margarine, some tub margarines, cookies, crackers, and baked goods.  When baking and cooking, oils are a great substitute for butter. The monounsaturated oils are especially beneficial since it is believed they lower LDL and raise HDL. The oils you should avoid entirely are saturated tropical oils, such as coconut and palm.  Remember to eat a lot from food groups that are naturally free of saturated and trans fat, including fish, fruit, vegetables, beans, grains (barley, rice, couscous, bulgur wheat), and pasta (without cream sauces).  IDENTIFYING FOODS THAT LOWER FAT AND CHOLESTEROL  Soluble fiber may lower your cholesterol. This type of fiber is found in fruits such as apples, vegetables such as broccoli, potatoes, and carrots, legumes such as beans, peas, and lentils, and grains such as barley. Foods fortified with plant sterols (  phytosterol) may also lower cholesterol. You should eat at least 2 g per day of these foods for a cholesterol lowering effect.  Read package labels to identify low-saturated fats, trans fat free, and low-fat foods at the supermarket. Select cheeses that have only 2 to 3 g saturated fat per ounce. Use a  heart-healthy tub margarine that is free of trans fats or partially hydrogenated oil. When buying baked goods (cookies, crackers), avoid partially hydrogenated oils. Breads and muffins should be made from whole grains (whole-wheat or whole oat flour, instead of "flour" or "enriched flour"). Buy non-creamy canned soups with reduced salt and no added fats.  FOOD PREPARATION TECHNIQUES  Never deep-fry. If you must fry, either stir-fry, which uses very little fat, or use non-stick cooking sprays. When possible, broil, bake, or roast meats, and steam vegetables. Instead of putting butter or margarine on vegetables, use lemon and herbs, applesauce, and cinnamon (for squash and sweet potatoes). Use nonfat yogurt, salsa, and low-fat dressings for salads.  LOW-SATURATED FAT / LOW-FAT FOOD SUBSTITUTES Meats / Saturated Fat (g)  Avoid: Steak, marbled (3 oz/85 g) / 11 g  Choose: Steak, lean (3 oz/85 g) / 4 g  Avoid: Hamburger (3 oz/85 g) / 7 g  Choose: Hamburger, lean (3 oz/85 g) / 5 g  Avoid: Ham (3 oz/85 g) / 6 g  Choose: Ham, lean cut (3 oz/85 g) / 2.4 g  Avoid: Chicken, with skin, dark meat (3 oz/85 g) / 4 g  Choose: Chicken, skin removed, dark meat (3 oz/85 g) / 2 g  Avoid: Chicken, with skin, light meat (3 oz/85 g) / 2.5 g  Choose: Chicken, skin removed, light meat (3 oz/85 g) / 1 g Dairy / Saturated Fat (g)  Avoid: Whole milk (1 cup) / 5 g  Choose: Low-fat milk, 2% (1 cup) / 3 g  Choose: Low-fat milk, 1% (1 cup) / 1.5 g  Choose: Skim milk (1 cup) / 0.3 g  Avoid: Hard cheese (1 oz/28 g) / 6 g  Choose: Skim milk cheese (1 oz/28 g) / 2 to 3 g  Avoid: Cottage cheese, 4% fat (1 cup) / 6.5 g  Choose: Low-fat cottage cheese, 1% fat (1 cup) / 1.5 g  Avoid: Ice cream (1 cup) / 9 g  Choose: Sherbet (1 cup) / 2.5 g  Choose: Nonfat frozen yogurt (1 cup) / 0.3 g  Choose: Frozen fruit bar / trace  Avoid: Whipped cream (1 tbs) / 3.5 g  Choose: Nondairy whipped topping (1 tbs) / 1  g Condiments / Saturated Fat (g)  Avoid: Mayonnaise (1 tbs) / 2 g  Choose: Low-fat mayonnaise (1 tbs) / 1 g  Avoid: Butter (1 tbs) / 7 g  Choose: Extra light margarine (1 tbs) / 1 g  Avoid: Coconut oil (1 tbs) / 11.8 g  Choose: Olive oil (1 tbs) / 1.8 g  Choose: Corn oil (1 tbs) / 1.7 g  Choose: Safflower oil (1 tbs) / 1.2 g  Choose: Sunflower oil (1 tbs) / 1.4 g  Choose: Soybean oil (1 tbs) / 2.4 g  Choose: Canola oil (1 tbs) / 1 g Document Released: 09/21/2005 Document Revised: 01/16/2013 Document Reviewed: 03/12/2011 [employer] Patient Information 2014 [place], [place].

## 2013-12-12 ENCOUNTER — Other Ambulatory Visit: Payer: Self-pay | Admitting: *Deleted

## 2013-12-12 DIAGNOSIS — R7989 Other specified abnormal findings of blood chemistry: Secondary | ICD-10-CM

## 2013-12-12 LAB — VITAMIN B12: Vitamin B-12: 394 pg/mL (ref 211–911)

## 2013-12-12 LAB — TSH: TSH: 49.716 u[IU]/mL — ABNORMAL HIGH (ref 0.350–4.500)

## 2013-12-12 LAB — VITAMIN D 25 HYDROXY (VIT D DEFICIENCY, FRACTURES): Vit D, 25-Hydroxy: 43 ng/mL (ref 30–89)

## 2013-12-12 MED ORDER — PRAVASTATIN SODIUM 40 MG PO TABS
40.0000 mg | ORAL_TABLET | Freq: Every evening | ORAL | Status: DC
Start: 2013-12-12 — End: 2014-12-25

## 2013-12-18 ENCOUNTER — Ambulatory Visit
Admission: RE | Admit: 2013-12-18 | Discharge: 2013-12-18 | Disposition: A | Discharge status: Home / Self Care | Payer: BC Managed Care – PPO | Source: Ambulatory Visit | Attending: Emergency Medicine | Admitting: Emergency Medicine

## 2013-12-18 DIAGNOSIS — R7989 Other specified abnormal findings of blood chemistry: Secondary | ICD-10-CM

## 2014-01-03 ENCOUNTER — Other Ambulatory Visit: Payer: Self-pay

## 2014-01-04 ENCOUNTER — Other Ambulatory Visit: Payer: Self-pay | Admitting: Emergency Medicine

## 2014-01-04 DIAGNOSIS — E782 Mixed hyperlipidemia: Secondary | ICD-10-CM

## 2014-01-04 DIAGNOSIS — E039 Hypothyroidism, unspecified: Secondary | ICD-10-CM

## 2014-01-05 ENCOUNTER — Other Ambulatory Visit: Payer: BC Managed Care – PPO

## 2014-01-05 DIAGNOSIS — E039 Hypothyroidism, unspecified: Secondary | ICD-10-CM

## 2014-01-05 DIAGNOSIS — E782 Mixed hyperlipidemia: Secondary | ICD-10-CM

## 2014-01-05 DIAGNOSIS — I10 Essential (primary) hypertension: Secondary | ICD-10-CM

## 2014-01-05 LAB — HEPATIC FUNCTION PANEL
ALT: 11 U/L (ref 0–35)
AST: 18 U/L (ref 0–37)
Albumin: 4.4 g/dL (ref 3.5–5.2)
Alkaline Phosphatase: 91 U/L (ref 39–117)
Bilirubin, Direct: 0.1 mg/dL (ref 0.0–0.3)
Indirect Bilirubin: 0.4 mg/dL (ref 0.2–1.2)
TOTAL PROTEIN: 7.6 g/dL (ref 6.0–8.3)
Total Bilirubin: 0.5 mg/dL (ref 0.2–1.2)

## 2014-01-05 LAB — LIPID PANEL
CHOLESTEROL: 242 mg/dL — AB (ref 0–200)
HDL: 55 mg/dL (ref >39)
LDL Cholesterol: 129 mg/dL — ABNORMAL HIGH (ref 0–99)
TRIGLYCERIDES: 289 mg/dL — AB (ref <150)
Total CHOL/HDL Ratio: 4.4 Ratio
VLDL: 58 mg/dL — ABNORMAL HIGH (ref 0–40)

## 2014-01-05 LAB — TSH: TSH: 44.959 u[IU]/mL — ABNORMAL HIGH (ref 0.350–4.500)

## 2014-06-12 ENCOUNTER — Other Ambulatory Visit: Payer: Self-pay | Admitting: Emergency Medicine

## 2014-06-12 ENCOUNTER — Other Ambulatory Visit: Payer: Self-pay | Admitting: *Deleted

## 2014-06-12 ENCOUNTER — Other Ambulatory Visit: Payer: Self-pay | Admitting: Internal Medicine

## 2014-06-12 ENCOUNTER — Telehealth: Payer: Self-pay | Admitting: Internal Medicine

## 2014-06-12 MED ORDER — OMEPRAZOLE 40 MG PO CPDR: DELAYED_RELEASE_CAPSULE | ORAL | Status: DC

## 2014-06-12 NOTE — Telephone Encounter (Signed)
OV REQUIRED FOR RX REFILL  Thank you, Katrina Webb [facility] [phone] Fax [phone]

## 2014-07-10 ENCOUNTER — Ambulatory Visit (INDEPENDENT_AMBULATORY_CARE_PROVIDER_SITE_OTHER): Payer: BC Managed Care – PPO | Admitting: Physician Assistant

## 2014-07-10 VITALS — BP 128/72 | HR 84 | Temp 98.1°F | Resp 16 | Wt 194.0 lb

## 2014-07-10 DIAGNOSIS — E559 Vitamin D deficiency, unspecified: Secondary | ICD-10-CM

## 2014-07-10 DIAGNOSIS — E039 Hypothyroidism, unspecified: Secondary | ICD-10-CM

## 2014-07-10 DIAGNOSIS — E785 Hyperlipidemia, unspecified: Secondary | ICD-10-CM

## 2014-07-10 DIAGNOSIS — Z5181 Encounter for therapeutic drug level monitoring: Secondary | ICD-10-CM

## 2014-07-10 DIAGNOSIS — Z23 Encounter for immunization: Secondary | ICD-10-CM

## 2014-07-10 DIAGNOSIS — R030 Elevated blood-pressure reading, without diagnosis of hypertension: Secondary | ICD-10-CM

## 2014-07-10 LAB — CBC WITH DIFFERENTIAL/PLATELET
BASOS ABS: 0.1 10*3/uL (ref 0.0–0.1)
Basophils Relative: 1 % (ref 0–1)
EOS ABS: 0.1 10*3/uL (ref 0.0–0.7)
Eosinophils Relative: 1 % (ref 0–5)
HEMATOCRIT: 38.4 % (ref 36.0–46.0)
Hemoglobin: 13.3 g/dL (ref 12.0–15.0)
Lymphocytes Relative: 27 % (ref 12–46)
Lymphs Abs: 2.1 10*3/uL (ref 0.7–4.0)
MCH: 29.5 pg (ref 26.0–34.0)
MCHC: 34.6 g/dL (ref 30.0–36.0)
MCV: 85.1 fL (ref 78.0–100.0)
Monocytes Absolute: 0.5 10*3/uL (ref 0.1–1.0)
Monocytes Relative: 7 % (ref 3–12)
Neutro Abs: 4.9 10*3/uL (ref 1.7–7.7)
Neutrophils Relative %: 64 % (ref 43–77)
PLATELETS: 307 10*3/uL (ref 150–400)
RBC: 4.51 MIL/uL (ref 3.87–5.11)
RDW: 14.6 % (ref 11.5–15.5)
WBC: 7.6 10*3/uL (ref 4.0–10.5)

## 2014-07-10 LAB — BASIC METABOLIC PANEL WITH GFR
BUN: 16 mg/dL (ref 6–23)
CALCIUM: 9.6 mg/dL (ref 8.4–10.5)
CO2: 29 mEq/L (ref 19–32)
CREATININE: 0.9 mg/dL (ref 0.50–1.10)
Chloride: 103 mEq/L (ref 96–112)
GFR, EST NON AFRICAN AMERICAN: 70 mL/min
GFR, Est African American: 81 mL/min
Glucose, Bld: 87 mg/dL (ref 70–99)
Potassium: 4 mEq/L (ref 3.5–5.3)
Sodium: 140 mEq/L (ref 135–145)

## 2014-07-10 LAB — HEPATIC FUNCTION PANEL
ALT: 15 U/L (ref 0–35)
AST: 20 U/L (ref 0–37)
Albumin: 4.5 g/dL (ref 3.5–5.2)
Alkaline Phosphatase: 106 U/L (ref 39–117)
BILIRUBIN TOTAL: 0.4 mg/dL (ref 0.2–1.2)
Bilirubin, Direct: 0.1 mg/dL (ref 0.0–0.3)
Indirect Bilirubin: 0.3 mg/dL (ref 0.2–1.2)
Total Protein: 7.8 g/dL (ref 6.0–8.3)

## 2014-07-10 LAB — LIPID PANEL
Cholesterol: 237 mg/dL — ABNORMAL HIGH (ref 0–200)
HDL: 53 mg/dL (ref >39)
LDL Cholesterol: 127 mg/dL — ABNORMAL HIGH (ref 0–99)
TRIGLYCERIDES: 284 mg/dL — AB (ref <150)
Total CHOL/HDL Ratio: 4.5 Ratio
VLDL: 57 mg/dL — ABNORMAL HIGH (ref 0–40)

## 2014-07-10 LAB — MAGNESIUM: MAGNESIUM: 2.1 mg/dL (ref 1.5–2.5)

## 2014-07-10 MED ORDER — BACLOFEN 10 MG PO TABS: ORAL_TABLET | ORAL | Status: AC

## 2014-07-10 NOTE — Patient Instructions (Signed)
Benefiber is good for constipation/diarrhea/irritable bowel syndrome, it helps with weight loss and can help lower your bad cholesterol. Please do 1-2 TBSP in the morning in water, coffee, or tea. It can take up to a month before you can see a difference with your bowel movements. It is cheapest from [employer], [employer], [employer].   What is the TMJ? The temporomandibular (tem-PUH-ro-man-DIB-yoo-ler) joint, or the TMJ, connects the upper and lower jawbones. This joint allows the jaw to open wide and move back and forth when you chew, talk, or yawn.There are also several muscles that help this joint move. There can be muscle tightness and pain in the muscle that can cause several symptoms.  What causes TMJ pain? There are many causes of TMJ pain. Repeated chewing (for example, chewing gum) and clenching your teeth can cause pain in the joint. Some TMJ pain has no obvious cause. What can I do to ease the pain? There are many things you can do to help your pain get better. When you have pain:  Eat soft foods and stay away from chewy foods (for example, taffy) Try to use both sides of your mouth to chew Don't chew gum Don't open your mouth wide (for example, during yawning or singing) Don't bite your cheeks or fingernails Lower your amount of stress and worry Applying a warm, damp washcloth to the joint may help. Over-the-counter pain medicines such as ibuprofen (one brand: Advil) or acetaminophen (one brand: Tylenol) might also help. Do not use these medicines if you are allergic to them or if your doctor told you not to use them. How can I stop the pain from coming back? When your pain is better, you can do these exercises to make your muscles stronger and to keep the pain from coming back:  Resisted mouth opening: Place your thumb or two fingers under your chin and open your mouth slowly, pushing up lightly on your chin with your thumb. Hold for three to six seconds. Close your mouth slowly. Resisted  mouth closing: Place your thumbs under your chin and your two index fingers on the ridge between your mouth and the bottom of your chin. Push down lightly on your chin as you close your mouth. Tongue up: Slowly open and close your mouth while keeping the tongue touching the roof of the mouth. Side-to-side jaw movement: Place an object about one fourth of an inch thick (for example, two tongue depressors) between your front teeth. Slowly move your jaw from side to side. Increase the thickness of the object as the exercise becomes easier Forward jaw movement: Place an object about one fourth of an inch thick between your front teeth and move the bottom jaw forward so that the bottom teeth are in front of the top teeth. Increase the thickness of the object as the exercise becomes easier. These exercises should not be painful. If it hurts to do these exercises, stop doing them and talk to your family doctor.   Sleep Apnea  Sleep apnea is a sleep disorder characterized by abnormal pauses in breathing while you sleep. When your breathing pauses, the level of oxygen in your blood decreases. This causes you to move out of deep sleep and into light sleep. As a result, your quality of sleep is poor, and the system that carries your blood throughout your body (cardiovascular system) experiences stress. If sleep apnea remains untreated, the following conditions can develop:  High blood pressure (hypertension).  Coronary artery disease.  Inability to achieve  or maintain an erection (impotence).  Impairment of your thought process (cognitive dysfunction). There are three types of sleep apnea: 1. Obstructive sleep apnea--Pauses in breathing during sleep because of a blocked airway. 2. Central sleep apnea--Pauses in breathing during sleep because the area of the brain that controls your breathing does not send the correct signals to the muscles that control breathing. 3. Mixed sleep apnea--A combination of both  obstructive and central sleep apnea. RISK FACTORS The following risk factors can increase your risk of developing sleep apnea:  Being overweight.  Smoking.  Having narrow passages in your nose and throat.  Being of older age.  Being female.  Alcohol use.  Sedative and tranquilizer use.  Ethnicity. Among individuals younger than 35 years, African Americans are at increased risk of sleep apnea. SYMPTOMS   Difficulty staying asleep.  Daytime sleepiness and fatigue.  Loss of energy.  Irritability.  Loud, heavy snoring.  Morning headaches.  Trouble concentrating.  Forgetfulness.  Decreased interest in sex. DIAGNOSIS  In order to diagnose sleep apnea, your caregiver will perform a physical examination. Your caregiver may suggest that you take a home sleep test. Your caregiver may also recommend that you spend the night in a sleep lab. In the sleep lab, several monitors record information about your heart, lungs, and brain while you sleep. Your leg and arm movements and blood oxygen level are also recorded. TREATMENT The following actions may help to resolve mild sleep apnea:  Sleeping on your side.   Using a decongestant if you have nasal congestion.   Avoiding the use of depressants, including alcohol, sedatives, and narcotics.   Losing weight and modifying your diet if you are overweight. There also are devices and treatments to help open your airway:  Oral appliances. These are custom-made mouthpieces that shift your lower jaw forward and slightly open your bite. This opens your airway.  Devices that create positive airway pressure. This positive pressure "splints" your airway open to help you breathe better during sleep. The following devices create positive airway pressure:  Continuous positive airway pressure (CPAP) device. The CPAP device creates a continuous level of air pressure with an air pump. The air is delivered to your airway through a mask while you  sleep. This continuous pressure keeps your airway open.  Nasal expiratory positive airway pressure (EPAP) device. The EPAP device creates positive air pressure as you exhale. The device consists of single-use valves, which are inserted into each nostril and held in place by adhesive. The valves create very little resistance when you inhale but create much more resistance when you exhale. That increased resistance creates the positive airway pressure. This positive pressure while you exhale keeps your airway open, making it easier to breath when you inhale again.  Bilevel positive airway pressure (BPAP) device. The BPAP device is used mainly in patients with central sleep apnea. This device is similar to the CPAP device because it also uses an air pump to deliver continuous air pressure through a mask. However, with the BPAP machine, the pressure is set at two different levels. The pressure when you exhale is lower than the pressure when you inhale.  Surgery. Typically, surgery is only done if you cannot comply with less invasive treatments or if the less invasive treatments do not improve your condition. Surgery involves removing excess tissue in your airway to create a wider passage way. Document Released: 09/11/2002 Document Revised: 01/16/2013 Document Reviewed: 01/28/2012 [facility] Patient Information 2015 [place], [place]. This information is not  intended to replace advice given to you by your health care provider. Make sure you discuss any questions you have with your health care provider.
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
## 2014-07-11 LAB — VITAMIN D 25 HYDROXY (VIT D DEFICIENCY, FRACTURES): Vit D, 25-Hydroxy: 32 ng/mL (ref 30–89)

## 2014-07-11 LAB — TSH: TSH: 16.437 u[IU]/mL — ABNORMAL HIGH (ref 0.350–4.500)

## 2014-08-14 ENCOUNTER — Other Ambulatory Visit: Payer: Self-pay

## 2014-08-14 ENCOUNTER — Other Ambulatory Visit: Payer: Self-pay | Admitting: Internal Medicine

## 2014-08-15 ENCOUNTER — Other Ambulatory Visit: Payer: BC Managed Care – PPO

## 2014-08-15 DIAGNOSIS — E069 Thyroiditis, unspecified: Secondary | ICD-10-CM

## 2014-08-15 DIAGNOSIS — E039 Hypothyroidism, unspecified: Secondary | ICD-10-CM

## 2014-08-15 LAB — TSH: TSH: 3.877 u[IU]/mL (ref 0.350–4.500)

## 2014-08-16 MED ORDER — LEVOTHYROXINE SODIUM 112 MCG PO TABS: ORAL_TABLET | ORAL | Status: DC

## 2014-10-18 ENCOUNTER — Ambulatory Visit: Payer: Self-pay | Admitting: Physician Assistant

## 2014-10-23 ENCOUNTER — Encounter: Payer: 59 | Admitting: Physician Assistant

## 2014-10-24 NOTE — Progress Notes (Signed)
This encounter was created in error - please disregard.

## 2014-12-17 ENCOUNTER — Other Ambulatory Visit: Payer: Self-pay | Admitting: Physician Assistant

## 2014-12-17 ENCOUNTER — Other Ambulatory Visit: Payer: Self-pay | Admitting: Internal Medicine

## 2014-12-18 ENCOUNTER — Other Ambulatory Visit: Payer: Self-pay

## 2014-12-18 MED ORDER — LEVOTHYROXINE SODIUM 112 MCG PO TABS: ORAL_TABLET | ORAL | Status: DC

## 2014-12-18 MED ORDER — OMEPRAZOLE 40 MG PO CPDR: 40.0000 mg | DELAYED_RELEASE_CAPSULE | Freq: Every day | ORAL | Status: DC

## 2014-12-19 ENCOUNTER — Other Ambulatory Visit: Payer: Self-pay | Admitting: *Deleted

## 2014-12-19 MED ORDER — OMEPRAZOLE 40 MG PO CPDR: 40.0000 mg | DELAYED_RELEASE_CAPSULE | Freq: Every day | ORAL | Status: DC

## 2014-12-19 MED ORDER — LEVOTHYROXINE SODIUM 112 MCG PO TABS: ORAL_TABLET | ORAL | Status: DC

## 2014-12-24 ENCOUNTER — Ambulatory Visit (INDEPENDENT_AMBULATORY_CARE_PROVIDER_SITE_OTHER): Payer: Managed Care, Other (non HMO) | Admitting: Internal Medicine

## 2014-12-24 ENCOUNTER — Encounter: Payer: Self-pay | Admitting: Internal Medicine

## 2014-12-24 VITALS — BP 144/92 | HR 76 | Temp 97.3°F | Resp 16 | Ht 68.75 in | Wt 194.0 lb

## 2014-12-24 DIAGNOSIS — E559 Vitamin D deficiency, unspecified: Secondary | ICD-10-CM

## 2014-12-24 DIAGNOSIS — E782 Mixed hyperlipidemia: Secondary | ICD-10-CM | POA: Insufficient documentation

## 2014-12-24 DIAGNOSIS — E039 Hypothyroidism, unspecified: Secondary | ICD-10-CM

## 2014-12-24 DIAGNOSIS — R030 Elevated blood-pressure reading, without diagnosis of hypertension: Secondary | ICD-10-CM

## 2014-12-24 DIAGNOSIS — F4321 Adjustment disorder with depressed mood: Secondary | ICD-10-CM

## 2014-12-24 DIAGNOSIS — Z79899 Other long term (current) drug therapy: Secondary | ICD-10-CM | POA: Insufficient documentation

## 2014-12-24 DIAGNOSIS — R7309 Other abnormal glucose: Secondary | ICD-10-CM | POA: Insufficient documentation

## 2014-12-24 DIAGNOSIS — F329 Major depressive disorder, single episode, unspecified: Secondary | ICD-10-CM

## 2014-12-24 MED ORDER — SERTRALINE HCL 50 MG PO TABS
ORAL_TABLET | ORAL | Status: DC
Start: 2014-12-24 — End: 2015-02-04

## 2014-12-24 NOTE — Progress Notes (Signed)
Patient ID: Denise Jensen, female   DOB: 10-Apr-1956, 59 y.o.   MRN: [id number]   This very nice 59 y.o. MWF presents for 3 month follow up with Hypertension, Hyperlipidemia, Pre-Diabetes and Vitamin D Deficiency.    Patient is treated for HTN & BP has been controlled at home. Today's BP is elevated at 144/92. Patient has had no complaints of any cardiac type chest pain, palpitations, dyspnea/orthopnea/PND, dizziness, claudication, or dependent edema. Patient is upset today relating the recent loss of her mother and having several run-ins with a bipolar sister who has hx/o psyche hospitalizations over the settlement of her mother's estate.   Hyperlipidemia is  not controlled with diet & meds. Patient denies myalgias or other med SE's. Last Lipids were not at goal - Total  Chol 237; HDL 53; LDL  127; and with elevated Trig 284 on 07/10/2014:   Also, the patient is overweight & has history of elevated glucose and is screened for glucose intolerance & prediabetes and has had no symptoms of reactive hypoglycemia, diabetic polys, paresthesias or visual blurring.  Last A1c was 5.5%.   Patient has been on thyroid replacement since 2000. Patient had normal thyroid U/S last year. Further, the patient also has history of Vitamin D Deficiency of 35 & 37 in 2013 and supplements vitamin D sporadically without any suspected side-effects. Last vitamin D was 32 on 07/10/2014.  Medication Sig  . VITAMIN D 5000 UNITS TABS Take 5,000 Units  daily.  . fexofenadine 180 MG tablet Take  daily.  Marland Kitchen levothyroxine  112 MCG tablet Once daily   . omeprazole  40 MG capsule Take 1 cap daily.  . pravastatin  40 MG tablet Take 1 tab every evening.   Allergies  Allergen Reactions  . Codeine     REACTION: hallucinations  . Mometasone Furoate     REACTION: headache   PMHx:   Past Medical History  Diagnosis Date  . Unspecified vitamin D deficiency   . GERD (gastroesophageal reflux disease)   . Unspecified hypothyroidism     Immunization History  Administered Date(s) Administered  . Influenza Split 07/10/2014  . Influenza Whole 07/06/2007  . Td 10/05/2001   FHx:    Reviewed / unchanged  SHx:    Reviewed / unchanged  Systems Review:  Constitutional: Denies fever, chills, wt changes, headaches, insomnia, fatigue, night sweats, change in appetite. Eyes: Denies redness, blurred vision, diplopia, discharge, itchy, watery eyes.  ENT: Denies discharge, congestion, post nasal drip, epistaxis, sore throat, earache, hearing loss, dental pain, tinnitus, vertigo, sinus pain, snoring.  CV: Denies chest pain, palpitations, irregular heartbeat, syncope, dyspnea, diaphoresis, orthopnea, PND, claudication or edema. Respiratory: denies cough, dyspnea, DOE, pleurisy, hoarseness, laryngitis, wheezing.  Gastrointestinal: Denies dysphagia, odynophagia, heartburn, reflux, water brash, abdominal pain or cramps, nausea, vomiting, bloating, diarrhea, constipation, hematemesis, melena, hematochezia  or hemorrhoids. Genitourinary: Denies dysuria, frequency, urgency, nocturia, hesitancy, discharge, hematuria or flank pain. Musculoskeletal: Denies arthralgias, myalgias, stiffness, jt. swelling, pain, limping or strain/sprain.  Skin: Denies pruritus, rash, hives, warts, acne, eczema or change in skin lesion(s). Neuro: No weakness, tremor, incoordination, spasms, paresthesia or pain. Psychiatric: Denies confusion, memory loss or sensory loss. Endo: Denies change in weight, skin or hair change.  Heme/Lymph: No excessive bleeding, bruising or enlarged lymph nodes.  Physical Exam  BP 144/92   Pulse 76  Temp97.3 F   Resp 16  Ht 5' 8.75"   Wt 194 lb   BMI 28.87   Appears well nourished and in no distress.  Eyes: PERRLA, EOMs, conjunctiva no swelling or erythema. Sinuses: No frontal/maxillary tenderness ENT/Mouth: EAC's clear, TM's nl w/o erythema, bulging. Nares clear w/o erythema, swelling, exudates. Oropharynx clear without  erythema or exudates. Oral hygiene is good. Tongue normal, non obstructing. Hearing intact.  Neck: Supple. Thyroid nl. Car 2+/2+ without bruits, nodes or JVD. Chest: Respirations nl with BS clear & equal w/o rales, rhonchi, wheezing or stridor.  Cor: Heart sounds normal w/ regular rate and rhythm without sig. murmurs, gallops, clicks, or rubs. Peripheral pulses normal and equal  without edema.  Abdomen: Soft & bowel sounds normal. Non-tender w/o guarding, rebound, hernias, masses, or organomegaly.  Lymphatics: Unremarkable.  Musculoskeletal: Full ROM all peripheral extremities, joint stability, 5/5 strength, and normal gait.  Skin: Warm, dry without exposed rashes, lesions or ecchymosis apparent.  Neuro: Cranial nerves intact, reflexes equal bilaterally. Sensory-motor testing grossly intact. Tendon reflexes grossly intact.  Pysch: Alert & oriented x 3.  Insight and judgement nl & appropriate. No ideations.  Assessment and Plan:  1. ELEVATED BP w/o Dx/o HTN   2. Mixed hyperlipidemia  - Lipid panel  3. Abnormal glucose  - Hemoglobin A1c - Insulin, random  4. Vitamin D deficiency  - Vit D  25 hydroxy (rtn osteoporosis monitoring)  5. Hypothyroidism, unspecified hypothyroidism type  - TSH  6. Reactive depression (situational)  - sertraline (ZOLOFT) 50 MG tablet; Take 1 tablet daily on a full stomach  Dispense: 30 tablet; Refill: 99  7. Medication management - CBC with Differential/Platelet - BASIC METABOLIC PANEL WITH GFR - Hepatic function panel - Magnesium   Recommended regular exercise, BP monitoring, weight control, and discussed med and SE's. Recommended labs to assess and monitor clinical status. Further disposition pending results of labs.

## 2014-12-24 NOTE — Patient Instructions (Signed)

## 2014-12-25 ENCOUNTER — Other Ambulatory Visit: Payer: Self-pay | Admitting: Internal Medicine

## 2014-12-25 DIAGNOSIS — E782 Mixed hyperlipidemia: Secondary | ICD-10-CM

## 2014-12-25 LAB — BASIC METABOLIC PANEL WITH GFR
BUN: 9 mg/dL (ref 6–23)
CALCIUM: 10.2 mg/dL (ref 8.4–10.5)
CO2: 28 mEq/L (ref 19–32)
Chloride: 102 mEq/L (ref 96–112)
Creat: 0.85 mg/dL (ref 0.50–1.10)
GFR, Est African American: 87 mL/min
GFR, Est Non African American: 75 mL/min
GLUCOSE: 83 mg/dL (ref 70–99)
Potassium: 4.3 mEq/L (ref 3.5–5.3)
SODIUM: 142 meq/L (ref 135–145)

## 2014-12-25 LAB — CBC WITH DIFFERENTIAL/PLATELET
Basophils Absolute: 0.1 10*3/uL (ref 0.0–0.1)
Basophils Relative: 1 % (ref 0–1)
EOS PCT: 1 % (ref 0–5)
Eosinophils Absolute: 0.1 10*3/uL (ref 0.0–0.7)
HCT: 44 % (ref 36.0–46.0)
HEMOGLOBIN: 14.8 g/dL (ref 12.0–15.0)
LYMPHS PCT: 25 % (ref 12–46)
Lymphs Abs: 2.1 10*3/uL (ref 0.7–4.0)
MCH: 29.6 pg (ref 26.0–34.0)
MCHC: 33.6 g/dL (ref 30.0–36.0)
MCV: 88 fL (ref 78.0–100.0)
MPV: 8.8 fL (ref 8.6–12.4)
Monocytes Absolute: 0.5 10*3/uL (ref 0.1–1.0)
Monocytes Relative: 6 % (ref 3–12)
NEUTROS PCT: 67 % (ref 43–77)
Neutro Abs: 5.6 10*3/uL (ref 1.7–7.7)
Platelets: 338 10*3/uL (ref 150–400)
RBC: 5 MIL/uL (ref 3.87–5.11)
RDW: 14.5 % (ref 11.5–15.5)
WBC: 8.3 10*3/uL (ref 4.0–10.5)

## 2014-12-25 LAB — LIPID PANEL
CHOL/HDL RATIO: 5 ratio
Cholesterol: 265 mg/dL — ABNORMAL HIGH (ref 0–200)
HDL: 53 mg/dL (ref >=46)
LDL Cholesterol: 146 mg/dL — ABNORMAL HIGH (ref 0–99)
Triglycerides: 328 mg/dL — ABNORMAL HIGH (ref <150)
VLDL: 66 mg/dL — AB (ref 0–40)

## 2014-12-25 LAB — HEPATIC FUNCTION PANEL
ALT: 18 U/L (ref 0–35)
AST: 20 U/L (ref 0–37)
Albumin: 4.5 g/dL (ref 3.5–5.2)
Alkaline Phosphatase: 115 U/L (ref 39–117)
BILIRUBIN DIRECT: 0.1 mg/dL (ref 0.0–0.3)
BILIRUBIN INDIRECT: 0.4 mg/dL (ref 0.2–1.2)
BILIRUBIN TOTAL: 0.5 mg/dL (ref 0.2–1.2)
Total Protein: 7.9 g/dL (ref 6.0–8.3)

## 2014-12-25 LAB — VITAMIN D 25 HYDROXY (VIT D DEFICIENCY, FRACTURES): VIT D 25 HYDROXY: 26 ng/mL — AB (ref 30–100)

## 2014-12-25 LAB — INSULIN, RANDOM: INSULIN: 8 u[IU]/mL (ref 2.0–19.6)

## 2014-12-25 LAB — MAGNESIUM: MAGNESIUM: 2.2 mg/dL (ref 1.5–2.5)

## 2014-12-25 LAB — TSH: TSH: 1.55 u[IU]/mL (ref 0.350–4.500)

## 2014-12-25 LAB — HEMOGLOBIN A1C
Hgb A1c MFr Bld: 5.7 % — ABNORMAL HIGH (ref <5.7)
MEAN PLASMA GLUCOSE: 117 mg/dL — AB (ref <117)

## 2014-12-25 MED ORDER — ATORVASTATIN CALCIUM 80 MG PO TABS: ORAL_TABLET | ORAL | Status: DC

## 2014-12-26 ENCOUNTER — Telehealth: Payer: Self-pay | Admitting: *Deleted

## 2014-12-26 NOTE — Telephone Encounter (Signed)
Pt aware of lab results 

## 2015-02-04 ENCOUNTER — Encounter: Payer: Self-pay | Admitting: Internal Medicine

## 2015-02-04 ENCOUNTER — Ambulatory Visit (INDEPENDENT_AMBULATORY_CARE_PROVIDER_SITE_OTHER): Payer: Managed Care, Other (non HMO) | Admitting: Internal Medicine

## 2015-02-04 VITALS — BP 136/98 | HR 72 | Temp 98.2°F | Resp 18 | Ht 68.75 in | Wt 193.0 lb

## 2015-02-04 DIAGNOSIS — E782 Mixed hyperlipidemia: Secondary | ICD-10-CM

## 2015-02-04 DIAGNOSIS — F32A Depression, unspecified: Secondary | ICD-10-CM

## 2015-02-04 DIAGNOSIS — F329 Major depressive disorder, single episode, unspecified: Secondary | ICD-10-CM

## 2015-02-04 MED ORDER — ATORVASTATIN CALCIUM 80 MG PO TABS: ORAL_TABLET | ORAL | Status: DC

## 2015-02-04 NOTE — Patient Instructions (Signed)

## 2015-02-04 NOTE — Progress Notes (Signed)
Patient ID: Denise Jensen, female   DOB: 06-11-1956, 59 y.o.   MRN: [id number]  Assessment and Plan:   1. Mixed hyperlipidemia -restart lipitor -side effects discussed - atorvastatin (LIPITOR) 80 MG tablet; Take 1/2 tablet T, Th, and Saturday. May take up to 1 tablet if necessary.  Dispense: 30 tablet; Refill: 99  2. Depression -stop zoloft -depression improved     HPI 59 y.o.female presents for 6 week follow up of cholesterol and also depression.  Patient was supposed to be started on lipitor and also zoloft.  Patient did not take either of these medications. She reports that she started to feel better so she did not start taking the zoloft.  She reports that she never started taking the lipitor because the pravastatin made her depressed and she was so fatigued she could not do anything.  Patient reports that they have been doing well.  female is not taking their medication.  They are having difficulty with their medications. She reports that she has had some improvement in her fatigue.  She denies fevers, chills, nausea, vomiting, depression, insomnia, and anxiety.      Past Medical History  Diagnosis Date  . Unspecified vitamin D deficiency   . GERD (gastroesophageal reflux disease)   . Unspecified hypothyroidism      Allergies  Allergen Reactions  . Codeine     REACTION: hallucinations  . Mometasone Furoate     REACTION: headache      Current Outpatient Prescriptions on File Prior to Visit  Medication Sig Dispense Refill  . Cholecalciferol (VITAMIN D-3) 5000 UNITS TABS Take 5,000 Units by mouth daily.    . fexofenadine (ALLEGRA) 180 MG tablet Take 180 mg by mouth daily.    Marland Kitchen. levothyroxine (SYNTHROID) 112 MCG tablet Once daily on an empty stomach 30-60 mins before food. 90 tablet 0  . omeprazole (PRILOSEC) 40 MG capsule Take 1 capsule (40 mg total) by mouth daily. 90 capsule 0   No current facility-administered medications on file prior to visit.    ROS: all negative  except above.   Physical Exam: Filed Weights   02/04/15 0843  Weight: 193 lb (87.544 kg)   BP 136/98 mmHg  Pulse 72  Temp(Src) 98.2 F (36.8 C) (Temporal)  Resp 18  Ht 5' 8.75" (1.746 m)  Wt 193 lb (87.544 kg)  BMI 28.72 kg/m2  LMP  (LMP Unknown) General Appearance: Well developed well nourished, non-toxic appearing in no apparent distress. Eyes: PERRLA, EOMs, conjunctiva w/ no swelling or erythema or discharge Sinuses: No Frontal/maxillary tenderness ENT/Mouth: Ear canals clear without swelling or erythema.  TM's normal bilaterally with no retractions, bulging, or loss of landmarks.   Neck: Supple, thyroid normal, no notable JVD  Respiratory: Respiratory effort normal, Clear breath sounds anteriorly and posteriorly bilaterally without rales, rhonchi, wheezing or stridor. No retractions or accessory muscle usage. Cardio: RRR with no MRGs.   Abdomen: Soft, + BS.  Non tender, no guarding, rebound, hernias, masses.  Musculoskeletal: Full ROM, 5/5 strength, normal gait.  Skin: Warm, dry without rashes  Neuro: Awake and oriented X 3, Cranial nerves intact. Normal muscle tone, no cerebellar symptoms. Sensation intact.  Psych: normal affect, Insight and Judgment appropriate.     FORCUCCI, Mikaya Bunner, PA-C 8:57 AM [facility]

## 2015-03-18 ENCOUNTER — Other Ambulatory Visit: Payer: Self-pay | Admitting: *Deleted

## 2015-03-18 ENCOUNTER — Other Ambulatory Visit: Payer: Self-pay | Admitting: Physician Assistant

## 2015-03-18 MED ORDER — LEVOTHYROXINE SODIUM 112 MCG PO TABS: ORAL_TABLET | ORAL | Status: DC

## 2015-03-22 ENCOUNTER — Other Ambulatory Visit: Payer: Self-pay | Admitting: Physician Assistant

## 2015-04-10 ENCOUNTER — Ambulatory Visit (INDEPENDENT_AMBULATORY_CARE_PROVIDER_SITE_OTHER): Payer: Managed Care, Other (non HMO) | Admitting: Internal Medicine

## 2015-04-10 ENCOUNTER — Encounter: Payer: Self-pay | Admitting: Internal Medicine

## 2015-04-10 VITALS — BP 124/82 | HR 72 | Temp 97.3°F | Resp 16 | Ht 68.75 in | Wt 189.6 lb

## 2015-04-10 DIAGNOSIS — Z6828 Body mass index (BMI) 28.0-28.9, adult: Secondary | ICD-10-CM

## 2015-04-10 DIAGNOSIS — R112 Nausea with vomiting, unspecified: Secondary | ICD-10-CM

## 2015-04-10 DIAGNOSIS — H8309 Labyrinthitis, unspecified ear: Secondary | ICD-10-CM

## 2015-04-10 MED ORDER — ONDANSETRON 8 MG PO TBDP: ORAL_TABLET | ORAL | Status: DC

## 2015-04-10 MED ORDER — PROCHLORPERAZINE MALEATE 5 MG PO TABS: ORAL_TABLET | ORAL | Status: DC

## 2015-04-10 MED ORDER — PREDNISONE 20 MG PO TABS: ORAL_TABLET | ORAL | Status: DC

## 2015-04-10 NOTE — Progress Notes (Signed)
   Subjective:    Patient ID: Denise Jensen, female    DOB: 1956-05-27, 59 y.o.   MRN: [id number]  HPI This very nice 59 yo MWF presents with c/o intermittent vertigo with episodes lasting several hours and assoc severe N/V & spinning -type vertigo lasting several hours and occuring every 3-4 days . No fevers, chills or rash. No HA, head congestion,neuro sx's or  tinnitus. No sx's neurologic or otherwise betw the episodes. Last episode was 2-3 days ago. Did have one similar episode in Oct 2014 and was treated with a Decadron inj followed by a prednisone taper and w/o recurrence.   Medication Sig  . Cholecalciferol (VITAMIN D-3) 5000 UNITS TABS Take 5,000 Units by mouth daily.  . fexofenadine (ALLEGRA) 180 MG tablet Take 180 mg by mouth daily.  Marland Kitchen. levothyroxine (SYNTHROID, LEVOTHROID) 112 MCG tablet TAKE 1 TABLET BY MOUTH ONCE DAILY ON AN EMPTY STOMACH 30-60 MINUTES BEFORE A MEAL.  Marland Kitchen. omeprazole (PRILOSEC) 40 MG capsule TAKE 1 CAPSULE BY MOUTH DAILY.  Marland Kitchen. OVER THE COUNTER MEDICATION 500 mg daily. Tumeric  . atorvastatin (LIPITOR) 80 MG tablet Take 1/2 tablet T, Th, and Saturday. May take up to 1 tablet if necessary. (Patient not taking: Reported on 04/10/2015)  . levothyroxine (SYNTHROID) 112 MCG tablet Once daily on an empty stomach 30-60 mins before food.   Allergies  Allergen Reactions  . Codeine     REACTION: hallucinations  . Mometasone Furoate     REACTION: headache   Past Medical History  Diagnosis Date  . Unspecified vitamin D deficiency   . GERD (gastroesophageal reflux disease)   . Unspecified hypothyroidism    Review of Systems 10 point systems review negative except as above.    Objective:   Physical Exam  BP 124/82 mmHg  Pulse 72  Temp(Src) 97.3 F (36.3 C)  Resp 16  Ht 5' 8.75" (1.746 m)  Wt 189 lb 9.6 oz (86.002 kg)  BMI 28.21 kg/m2  LMP  (LMP Unknown)  HEENT - Eac's patent. TM's Nl. EOM's full / No nystagmus. PERRLA. NasoOroPharynx clear. Neck - supple. Nl  Thyroid. Carotids 2+ & No bruits, nodes, JVD Chest - Clear equal BS w/o Rales, rhonchi, wheezes. Cor - Nl HS. RRR w/o sig MGR. PP 1(+). No edema. Abd - No palpable organomegaly, masses or tenderness. BS nl. MS- FROM w/o deformities. Muscle power, tone and bulk Nl. Gait Nl. Neuro - No obvious Cr N abnormalities. Sensory, motor and Cerebellar functions appear Nl w/o focal abnormalities. Psyche - Mental status normal & appropriate.  No delusions, ideations or obvious mood abnormalities. Skin - clear.     Assessment & Plan:   1. Labyrinthitis  - predniSONE (DELTASONE) 20 MG tablet; 1 tab 3 x day for 3 days, then 1 tab 2 x day for 3 days, then 1 tab 1 x day for 5 days  Dispense: 20 tablet; Refill: 0 - prochlorperazine (COMPAZINE) 5 MG tablet; Take 1 to 2 tablets 3 x daily for vertigo  Dispense: 50 tablet; Refill: 1  2. Non-intractable vomiting with nausea  - prochlorperazine (COMPAZINE) 5 MG tablet; Take 1 to 2 tablets 3 x daily for vertigo  Dispense: 50 tablet; Refill: 1 - ondansetron (ZOFRAN ODT) 8 MG disintegrating tablet; Dissolve 1 tablet under tongue as needed for severe nausea / vomitting  Dispense: 30 tablet; Refill: 1  Has f/u appt on July 22 w/ Courtney.

## 2015-04-10 NOTE — Patient Instructions (Addendum)
Benign Positional Vertigo Vertigo means you feel like you or your surroundings are moving when they are not. Benign positional vertigo is the most common form of vertigo. Benign means that the cause of your condition is not serious. Benign positional vertigo is more common in older adults. CAUSES  Benign positional vertigo is the result of an upset in the labyrinth system. This is an area in the middle ear that helps control your balance. This may be caused by a viral infection, head injury, or repetitive motion. However, often no specific cause is found. SYMPTOMS  Symptoms of benign positional vertigo occur when you move your head or eyes in different directions. Some of the symptoms may include:  Loss of balance and falls.  Vomiting.  Blurred vision.  Dizziness.  Nausea.  Involuntary eye movements (nystagmus). DIAGNOSIS  Benign positional vertigo is usually diagnosed by physical exam. If the specific cause of your benign positional vertigo is unknown, your caregiver may perform imaging tests, such as magnetic resonance imaging (MRI) or computed tomography (CT). TREATMENT  Your caregiver may recommend movements or procedures to correct the benign positional vertigo. Medicines such as meclizine, benzodiazepines, and medicines for nausea may be used to treat your symptoms. In rare cases, if your symptoms are caused by certain conditions that affect the inner ear, you may need surgery. HOME CARE INSTRUCTIONS   Follow your caregiver's instructions.  Move slowly. Do not make sudden body or head movements.  Avoid driving.  Avoid operating heavy machinery.  Avoid performing any tasks that would be dangerous to you or others during a vertigo episode.  Drink enough fluids to keep your urine clear or pale yellow. SEEK IMMEDIATE MEDICAL CARE IF:   You develop problems with walking, weakness, numbness, or using your arms, hands, or legs.  You have difficulty speaking.  You develop  severe headaches.  Your nausea or vomiting continues or gets worse.  You develop visual changes.  Your family or friends notice any behavioral changes.  Your condition gets worse.  You have a fever.  You develop a stiff neck or sensitivity to light. MAKE SURE YOU:   Understand these instructions.  Will watch your condition.  Will get help right away if you are not doing well or get worse. Document Released: 06/29/2006 Document Revised: 12/14/2011 Document Reviewed: 06/11/2011 Exit[employer] Patient Information 2015 [employer]. This information is not intended to replace advice given to you by your health care provider. Make sure you discuss any questions you have with your health care provider.    

## 2015-04-12 ENCOUNTER — Ambulatory Visit: Payer: Self-pay | Admitting: Internal Medicine

## 2015-04-26 ENCOUNTER — Ambulatory Visit (INDEPENDENT_AMBULATORY_CARE_PROVIDER_SITE_OTHER): Payer: Managed Care, Other (non HMO) | Admitting: Internal Medicine

## 2015-04-26 ENCOUNTER — Encounter: Payer: Self-pay | Admitting: Internal Medicine

## 2015-04-26 VITALS — BP 144/88 | HR 102 | Temp 98.2°F | Resp 18 | Ht 68.75 in | Wt 188.0 lb

## 2015-04-26 DIAGNOSIS — E782 Mixed hyperlipidemia: Secondary | ICD-10-CM

## 2015-04-26 DIAGNOSIS — E039 Hypothyroidism, unspecified: Secondary | ICD-10-CM

## 2015-04-26 DIAGNOSIS — R7309 Other abnormal glucose: Secondary | ICD-10-CM

## 2015-04-26 DIAGNOSIS — R030 Elevated blood-pressure reading, without diagnosis of hypertension: Secondary | ICD-10-CM

## 2015-04-26 DIAGNOSIS — E663 Overweight: Secondary | ICD-10-CM

## 2015-04-26 DIAGNOSIS — Z79899 Other long term (current) drug therapy: Secondary | ICD-10-CM

## 2015-04-26 DIAGNOSIS — E559 Vitamin D deficiency, unspecified: Secondary | ICD-10-CM

## 2015-04-26 LAB — CBC WITH DIFFERENTIAL/PLATELET
Basophils Absolute: 0.1 10*3/uL (ref 0.0–0.1)
Basophils Relative: 1 % (ref 0–1)
Eosinophils Absolute: 0.1 10*3/uL (ref 0.0–0.7)
Eosinophils Relative: 1 % (ref 0–5)
HCT: 43.1 % (ref 36.0–46.0)
Hemoglobin: 14.4 g/dL (ref 12.0–15.0)
Lymphocytes Relative: 22 % (ref 12–46)
Lymphs Abs: 1.9 10*3/uL (ref 0.7–4.0)
MCH: 29.3 pg (ref 26.0–34.0)
MCHC: 33.4 g/dL (ref 30.0–36.0)
MCV: 87.8 fL (ref 78.0–100.0)
MONO ABS: 0.6 10*3/uL (ref 0.1–1.0)
MPV: 8.2 fL — ABNORMAL LOW (ref 8.6–12.4)
Monocytes Relative: 7 % (ref 3–12)
Neutro Abs: 5.9 10*3/uL (ref 1.7–7.7)
Neutrophils Relative %: 69 % (ref 43–77)
PLATELETS: 286 10*3/uL (ref 150–400)
RBC: 4.91 MIL/uL (ref 3.87–5.11)
RDW: 14.7 % (ref 11.5–15.5)
WBC: 8.6 10*3/uL (ref 4.0–10.5)

## 2015-04-26 LAB — HEMOGLOBIN A1C
Hgb A1c MFr Bld: 5.8 % — ABNORMAL HIGH (ref <5.7)
MEAN PLASMA GLUCOSE: 120 mg/dL — AB (ref <117)

## 2015-04-26 LAB — BASIC METABOLIC PANEL WITH GFR
BUN: 10 mg/dL (ref 6–23)
CO2: 26 meq/L (ref 19–32)
CREATININE: 0.78 mg/dL (ref 0.50–1.10)
Calcium: 9.6 mg/dL (ref 8.4–10.5)
Chloride: 102 mEq/L (ref 96–112)
GFR, EST NON AFRICAN AMERICAN: 83 mL/min
Glucose, Bld: 94 mg/dL (ref 70–99)
POTASSIUM: 4.4 meq/L (ref 3.5–5.3)
Sodium: 141 mEq/L (ref 135–145)

## 2015-04-26 LAB — HEPATIC FUNCTION PANEL
ALK PHOS: 100 U/L (ref 39–117)
ALT: 12 U/L (ref 0–35)
AST: 14 U/L (ref 0–37)
Albumin: 4.1 g/dL (ref 3.5–5.2)
BILIRUBIN INDIRECT: 0.3 mg/dL (ref 0.2–1.2)
BILIRUBIN TOTAL: 0.4 mg/dL (ref 0.2–1.2)
Bilirubin, Direct: 0.1 mg/dL (ref 0.0–0.3)
TOTAL PROTEIN: 7.3 g/dL (ref 6.0–8.3)

## 2015-04-26 LAB — LIPID PANEL
Cholesterol: 259 mg/dL — ABNORMAL HIGH (ref 0–200)
HDL: 53 mg/dL (ref >=46)
LDL CALC: 127 mg/dL — AB (ref 0–99)
TRIGLYCERIDES: 396 mg/dL — AB (ref <150)
Total CHOL/HDL Ratio: 4.9 Ratio
VLDL: 79 mg/dL — ABNORMAL HIGH (ref 0–40)

## 2015-04-26 LAB — MAGNESIUM: MAGNESIUM: 2 mg/dL (ref 1.5–2.5)

## 2015-04-26 NOTE — Progress Notes (Signed)
Patient ID: Denise Jensen, female   DOB: 03-Apr-1956, 59 y.o.   MRN: [id number]  Assessment and Plan:  Hypertension:  -Continue medication,  -monitor blood pressure at home.  -Continue DASH diet.   -Reminder to go to the ER if any CP, SOB, nausea, dizziness, severe HA, changes vision/speech, left arm numbness and tingling, and jaw pain.  Cholesterol: -Continue diet and exercise.  -Check cholesterol.   Pre-diabetes: -Continue diet and exercise.  -Check A1C  Vitamin D Def: -check level -continue medications.   Continue diet and meds as discussed. Further disposition pending results of labs.  HPI 59 y.o. female  presents for 3 month follow up with hypertension, hyperlipidemia, prediabetes and vitamin D.   Her blood pressure has been controlled at home, today their BP is BP: (!) 144/88 mmHg.   She does workout. She denies chest pain, shortness of breath, dizziness.   She is not on cholesterol medication and denies myalgias. Her cholesterol is not at goal. The cholesterol last visit was:   Lab Results  Component Value Date   CHOL 265* 12/24/2014   HDL 53 12/24/2014   LDLCALC 146* 12/24/2014   LDLDIRECT 75.3 09/11/2010   TRIG 328* 12/24/2014   CHOLHDL 5.0 12/24/2014     She has been working on diet and exercise for prediabetes, and denies foot ulcerations, hyperglycemia, hypoglycemia , increased appetite, nausea, paresthesia of the feet, polydipsia, polyuria, visual disturbances, vomiting and weight loss. Last A1C in the office was:  Lab Results  Component Value Date   HGBA1C 5.7* 12/24/2014    Patient is on Vitamin D supplement.  Lab Results  Component Value Date   VD25OH 26* 12/24/2014     She reports that she has been having some issues with her back.  She is doing therapy which is helping.    She does report that she still has some sinus pressure and also some congestion. She would like to know if there is anything else she can take.  She has had a complete relief  of her vertigo for the time being.     Current Medications:  Current Outpatient Prescriptions on File Prior to Visit  Medication Sig Dispense Refill  . Cholecalciferol (VITAMIN D-3) 5000 UNITS TABS Take 5,000 Units by mouth daily.    . fexofenadine (ALLEGRA) 180 MG tablet Take 180 mg by mouth daily.    Marland Kitchen levothyroxine (SYNTHROID, LEVOTHROID) 112 MCG tablet TAKE 1 TABLET BY MOUTH ONCE DAILY ON AN EMPTY STOMACH 30-60 MINUTES BEFORE A MEAL. 90 tablet 3  . omeprazole (PRILOSEC) 40 MG capsule TAKE 1 CAPSULE BY MOUTH DAILY. 90 capsule 1  . OVER THE COUNTER MEDICATION 500 mg daily. Tumeric     No current facility-administered medications on file prior to visit.    Medical History:  Past Medical History  Diagnosis Date  . Unspecified vitamin D deficiency   . GERD (gastroesophageal reflux disease)   . Unspecified hypothyroidism     Allergies:  Allergies  Allergen Reactions  . Codeine     REACTION: hallucinations  . Mometasone Furoate     REACTION: headache     Review of Systems:  Review of Systems  Constitutional: Negative for fever, chills and malaise/fatigue.  HENT: Positive for congestion. Negative for ear discharge, ear pain and sore throat.   Eyes: Negative.   Respiratory: Negative for cough, shortness of breath and wheezing.   Cardiovascular: Negative for chest pain, palpitations and leg swelling.  Gastrointestinal: Negative for heartburn, diarrhea, constipation, blood in  stool and melena.  Genitourinary: Negative.   Skin: Negative.   Neurological: Negative for dizziness, sensory change, loss of consciousness and headaches.  Psychiatric/Behavioral: Negative for depression. The patient is not nervous/anxious and does not have insomnia.     Family history- Review and unchanged  Social history- Review and unchanged  Physical Exam: BP 144/88 mmHg  Pulse 102  Temp(Src) 98.2 F (36.8 C) (Temporal)  Resp 18  Ht 5' 8.75" (1.746 m)  Wt 188 lb (85.276 kg)  BMI 27.97 kg/m2   LMP  (LMP Unknown) Wt Readings from Last 3 Encounters:  04/26/15 188 lb (85.276 kg)  04/10/15 189 lb 9.6 oz (86.002 kg)  02/04/15 193 lb (87.544 kg)    General Appearance: Well nourished well developed, in no apparent distress. Eyes: PERRLA, EOMs, conjunctiva no swelling or erythema ENT/Mouth: Ear canals normal without obstruction, swelling, erythma, discharge.  TMs normal bilaterally.  Oropharynx moist, clear, without exudate, or postoropharyngeal swelling. Neck: Supple, thyroid normal,no cervical adenopathy  Respiratory: Respiratory effort normal, Breath sounds clear A&P without rhonchi, wheeze, or rale.  No retractions, no accessory usage. Cardio: RRR with no MRGs. Brisk peripheral pulses without edema.  Abdomen: Soft, + BS,  Non tender, no guarding, rebound, hernias, masses. Musculoskeletal: Full ROM, 5/5 strength, Normal gait Skin: Warm, dry without rashes, lesions, ecchymosis.  Neuro: Awake and oriented X 3, Cranial nerves intact. Normal muscle tone, no cerebellar symptoms. Psych: Normal affect, Insight and Judgment appropriate.    Terri Piedra, PA-C 9:51 AM [facility]

## 2015-04-27 LAB — VITAMIN D 25 HYDROXY (VIT D DEFICIENCY, FRACTURES): Vit D, 25-Hydroxy: 20 ng/mL — ABNORMAL LOW (ref 30–100)

## 2015-04-27 LAB — TSH: TSH: 2.579 u[IU]/mL (ref 0.350–4.500)

## 2015-04-29 LAB — INSULIN, RANDOM: Insulin: 34.8 u[IU]/mL — ABNORMAL HIGH (ref 2.0–19.6)

## 2015-05-06 ENCOUNTER — Other Ambulatory Visit (HOSPITAL_COMMUNITY): Payer: Self-pay | Admitting: Chiropractic Medicine

## 2015-05-06 ENCOUNTER — Ambulatory Visit (HOSPITAL_COMMUNITY)
Admission: RE | Admit: 2015-05-06 | Discharge: 2015-05-06 | Disposition: A | Discharge status: Home / Self Care | Payer: Managed Care, Other (non HMO) | Source: Ambulatory Visit | Attending: Chiropractic Medicine | Admitting: Chiropractic Medicine

## 2015-05-06 DIAGNOSIS — M62830 Muscle spasm of back: Secondary | ICD-10-CM | POA: Diagnosis present

## 2015-05-06 DIAGNOSIS — M47814 Spondylosis without myelopathy or radiculopathy, thoracic region: Secondary | ICD-10-CM | POA: Diagnosis not present

## 2015-05-06 DIAGNOSIS — M4184 Other forms of scoliosis, thoracic region: Secondary | ICD-10-CM | POA: Diagnosis not present

## 2015-05-15 ENCOUNTER — Encounter: Payer: Self-pay | Admitting: Internal Medicine

## 2015-05-15 ENCOUNTER — Ambulatory Visit (INDEPENDENT_AMBULATORY_CARE_PROVIDER_SITE_OTHER): Payer: Managed Care, Other (non HMO) | Admitting: Internal Medicine

## 2015-05-15 VITALS — BP 136/98 | HR 96 | Temp 98.7°F | Resp 16 | Ht 68.75 in | Wt 188.0 lb

## 2015-05-15 DIAGNOSIS — H8111 Benign paroxysmal vertigo, right ear: Secondary | ICD-10-CM | POA: Diagnosis not present

## 2015-05-15 MED ORDER — MONTELUKAST SODIUM 10 MG PO TABS: 10.0000 mg | ORAL_TABLET | Freq: Every day | ORAL | Status: DC

## 2015-05-15 MED ORDER — PREDNISONE 20 MG PO TABS: ORAL_TABLET | ORAL | Status: DC

## 2015-05-15 MED ORDER — MECLIZINE HCL 25 MG PO TABS: 25.0000 mg | ORAL_TABLET | Freq: Two times a day (BID) | ORAL | Status: DC

## 2015-05-15 NOTE — Progress Notes (Signed)
   Subjective:    Patient ID: Denise Jensen, female    DOB: 13-May-1956, 59 y.o.   MRN: [id number]  Dizziness Associated symptoms include congestion, nausea and vomiting. Pertinent negatives include no abdominal pain, chest pain, chills, fatigue, fever or sore throat.  Emesis  Associated symptoms include dizziness. Pertinent negatives include no abdominal pain, chest pain, chills, diarrhea or fever.   Patient reports that she has been having some increased sinus pain and also notes that she has been having some issues with some dizziness and vertigo with associated nausea and vomiting.  She reports that this is debilitating and has been really been an issue.  She would like to go to see a vestibular rehab person.  She has seen and ENT in the past  Who has been helpful but just told her she was prone to sinus infections.  She reports that the nasacort didn't seem to make a difference.  She has never been on singulair.  She reports that she has never had allergy shots in the past either.    Review of Systems  Constitutional: Negative for fever, chills and fatigue.  HENT: Positive for congestion, ear pain, postnasal drip and rhinorrhea. Negative for nosebleeds and sore throat.   Respiratory: Negative for chest tightness and shortness of breath.   Cardiovascular: Negative for chest pain and palpitations.  Gastrointestinal: Positive for nausea and vomiting. Negative for abdominal pain, diarrhea and constipation.  Neurological: Positive for dizziness.       Objective:   Physical Exam  Constitutional: She is oriented to person, place, and time. She appears well-developed and well-nourished. No distress.  HENT:  Head: Normocephalic.  Nose: Mucosal edema present.  Mouth/Throat: Uvula is midline and oropharynx is clear and moist. No trismus in the jaw. No oropharyngeal exudate, posterior oropharyngeal edema or posterior oropharyngeal erythema.  Halitosis, visible post nasal drip, bilateral middle  ear effusions  Eyes: Conjunctivae are normal. No scleral icterus.  Neck: Normal range of motion. Neck supple. No JVD present. No thyromegaly present.  Cardiovascular: Normal rate, regular rhythm, normal heart sounds and intact distal pulses.  Exam reveals no gallop and no friction rub.   No murmur heard. Pulmonary/Chest: Effort normal and breath sounds normal. No respiratory distress. She has no wheezes. She has no rales. She exhibits no tenderness.  Musculoskeletal: Normal range of motion.  Lymphadenopathy:    She has no cervical adenopathy.  Neurological: She is alert and oriented to person, place, and time.  Skin: Skin is warm and dry. She is not diaphoretic.  Psychiatric: She has a normal mood and affect. Her behavior is normal. Judgment and thought content normal.  Nursing note and vitals reviewed.   Filed Vitals:   05/15/15 1534  BP: 136/98  Pulse: 96  Temp: 98.7 F (37.1 C)  Resp: 16         Assessment & Plan:    1. Benign paroxysmal positional vertigo, right -meclizine -prednisone -flonase -singulair -if no relief than consider sending to allergist and ENT - PT vestibular rehab; Future

## 2015-07-31 ENCOUNTER — Encounter: Payer: Self-pay | Admitting: Internal Medicine

## 2015-08-06 ENCOUNTER — Ambulatory Visit (INDEPENDENT_AMBULATORY_CARE_PROVIDER_SITE_OTHER): Payer: Managed Care, Other (non HMO) | Admitting: Internal Medicine

## 2015-08-06 VITALS — BP 158/98 | HR 74 | Temp 97.8°F | Resp 16 | Ht 68.75 in | Wt 189.0 lb

## 2015-08-06 DIAGNOSIS — N6019 Diffuse cystic mastopathy of unspecified breast: Secondary | ICD-10-CM

## 2015-08-06 DIAGNOSIS — Z23 Encounter for immunization: Secondary | ICD-10-CM

## 2015-08-06 DIAGNOSIS — R7309 Other abnormal glucose: Secondary | ICD-10-CM

## 2015-08-06 DIAGNOSIS — E559 Vitamin D deficiency, unspecified: Secondary | ICD-10-CM | POA: Diagnosis not present

## 2015-08-06 DIAGNOSIS — Z79899 Other long term (current) drug therapy: Secondary | ICD-10-CM | POA: Diagnosis not present

## 2015-08-06 DIAGNOSIS — Z0000 Encounter for general adult medical examination without abnormal findings: Secondary | ICD-10-CM | POA: Diagnosis not present

## 2015-08-06 DIAGNOSIS — R030 Elevated blood-pressure reading, without diagnosis of hypertension: Secondary | ICD-10-CM

## 2015-08-06 DIAGNOSIS — E782 Mixed hyperlipidemia: Secondary | ICD-10-CM

## 2015-08-06 DIAGNOSIS — Z130 Encounter for screening for diseases of the blood and blood-forming organs and certain disorders involving the immune mechanism: Secondary | ICD-10-CM

## 2015-08-06 DIAGNOSIS — Z0001 Encounter for general adult medical examination with abnormal findings: Secondary | ICD-10-CM

## 2015-08-06 DIAGNOSIS — Z1212 Encounter for screening for malignant neoplasm of rectum: Secondary | ICD-10-CM

## 2015-08-06 DIAGNOSIS — E039 Hypothyroidism, unspecified: Secondary | ICD-10-CM

## 2015-08-06 NOTE — Progress Notes (Signed)
Complete Physical  Assessment and Plan:   1. Encounter for general adult medical examination with abnormal findings    2. Abnormal glucose  - Hemoglobin A1c  3. Mixed hyperlipidemia  - Lipid panel  4. Hypothyroidism, unspecified hypothyroidism type -TSH -cont levothyroxine  5. ELEVATED BLOOD PRESSURE WITHOUT DIAGNOSIS OF HYPERTENSION -very elevated today -she would like to watch for 2 weeks and will call if consistently elevated above 150/90 - EKG 12-Lead - Microalbumin / creatinine urine ratio - Urinalysis, Routine w reflex microscopic (not at [facility])  6. Vitamin D deficiency  - Vit D  25 hydroxy (rtn osteoporosis monitoring)  7. Medication management  - CBC with Differential/Platelet - BASIC METABOLIC PANEL WITH GFR - Hepatic function panel - Magnesium  8. Screening for rectal cancer  - POC Hemoccult Bld/Stl (3-Cd Home Screen); Future  9. Screening for deficiency anemia  - Vitamin B12 - Iron and TIBC  10. Fibrocystic breast, unspecified laterality  - MM DIGITAL SCREENING BILATERAL; Future    Discussed med's effects and SE's. Screening labs and tests as requested with regular follow-up as recommended.  HPI  59 y.o. female  presents for a complete physical.  Her blood pressure has not been controlled at home, today their BP is BP: (!) 158/98 mmHg.  She does not workout. She denies chest pain, shortness of breath, dizziness.   She is not on cholesterol medication and denies myalgias. Her cholesterol is not at goal. The cholesterol last visit was:  Lab Results  Component Value Date   CHOL 259* 04/26/2015   HDL 53 04/26/2015   LDLCALC 127* 04/26/2015   LDLDIRECT 75.3 09/11/2010   TRIG 396* 04/26/2015   CHOLHDL 4.9 04/26/2015  .  She has not been working on diet and exercise for prediabetes, she is not on bASA, she is not on ACE/ARB and denies foot ulcerations, hyperglycemia, hypoglycemia , increased appetite, nausea, paresthesia of the feet,  polydipsia, polyuria, visual disturbances, vomiting and weight loss. Last A1C in the office was:  Lab Results  Component Value Date   HGBA1C 5.8* 04/26/2015    Patient is on Vitamin D supplement.   Lab Results  Component Value Date   VD25OH 20* 04/26/2015     Patient reports that she has been going to physical therapy for her back.  She reports that it is helping her back tremendously.  She is very happy with her progress.      Current Medications:  Current Outpatient Prescriptions on File Prior to Visit  Medication Sig Dispense Refill  . Cholecalciferol (VITAMIN D-3) 5000 UNITS TABS Take 5,000 Units by mouth daily.    . fexofenadine (ALLEGRA) 180 MG tablet Take 180 mg by mouth daily.    Marland Kitchen levothyroxine (SYNTHROID, LEVOTHROID) 112 MCG tablet TAKE 1 TABLET BY MOUTH ONCE DAILY ON AN EMPTY STOMACH 30-60 MINUTES BEFORE A MEAL. 90 tablet 3  . montelukast (SINGULAIR) 10 MG tablet Take 1 tablet (10 mg total) by mouth daily. 30 tablet 2  . omeprazole (PRILOSEC) 40 MG capsule TAKE 1 CAPSULE BY MOUTH DAILY. 90 capsule 1  . OVER THE COUNTER MEDICATION 500 mg daily. Tumeric     No current facility-administered medications on file prior to visit.    Health Maintenance:   Immunization History  Administered Date(s) Administered  . Influenza Split 07/10/2014  . Influenza Whole 07/06/2007  . Td 10/05/2001    Tetanus: Due today Pneumovax: Not indicated yet Flu vaccine: Due today Zostavax: Postponed given flu vaccine and tdap Pap:  Will  do at next visit MGM:   Ordered Colonoscopy:  Declined Last Dental Exam:  Dr. Orson Apeoe [facility]  Patient Care Team: Lucky CowboyWilliam McKeown, MD as PCP - General (Internal Medicine)  Allergies:  Allergies  Allergen Reactions  . Codeine     REACTION: hallucinations  . Mometasone Furoate     REACTION: headache    Medical History:  Past Medical History  Diagnosis Date  . Unspecified vitamin D deficiency   . GERD  (gastroesophageal reflux disease)   . Unspecified hypothyroidism     Surgical History: No past surgical history on file.  Family History:  Family History  Problem Relation Age of Onset  . Depression Mother   . Arthritis Mother   . Hypertension Mother   . COPD Mother   . Kidney disease Mother   . Heart disease Father   . Heart disease Sister   . Depression Sister   . Cancer Sister     breast    Social History:  Social History  Substance Use Topics  . Smoking status: Former Smoker    Quit date: 10/05/1981  . Smokeless tobacco: Not on file  . Alcohol Use: No    Review of Systems: Review of Systems  Constitutional: Positive for malaise/fatigue and diaphoresis (Hot flashes). Negative for fever and chills.  HENT: Negative for congestion, ear pain and sore throat.   Respiratory: Negative for cough, shortness of breath and wheezing.   Cardiovascular: Negative for chest pain, palpitations and leg swelling.  Gastrointestinal: Positive for heartburn. Negative for diarrhea, constipation, blood in stool and melena.  Genitourinary: Negative.   Musculoskeletal: Positive for back pain.  Skin: Negative.   Neurological: Negative for dizziness, sensory change, loss of consciousness and headaches.  Psychiatric/Behavioral: Negative for depression. The patient is not nervous/anxious and does not have insomnia.     Physical Exam: Estimated body mass index is 28.12 kg/(m^2) as calculated from the following:   Height as of this encounter: 5' 8.75" (1.746 m).   Weight as of this encounter: 189 lb (85.73 kg). BP 158/98 mmHg  Pulse 74  Temp(Src) 97.8 F (36.6 C) (Temporal)  Resp 16  Ht 5' 8.75" (1.746 m)  Wt 189 lb (85.73 kg)  BMI 28.12 kg/m2  LMP  (LMP Unknown)  General Appearance: Well nourished well developed, in no apparent distress.  Eyes: PERRLA, EOMs, conjunctiva no swelling or erythema ENT/Mouth: Ear canals normal without obstruction, swelling, erythema, or discharge.  TMs  normal bilaterally with no erythema, bulging, retraction, or loss of landmark.  Oropharynx moist and clear with no exudate, erythema, or swelling.   Neck: Supple, thyroid normal. No bruits.  No cervical adenopathy Respiratory: Respiratory effort normal, Breath sounds clear A&P without wheeze, rhonchi, rales.   Cardio: RRR without murmurs, rubs or gallops. Brisk peripheral pulses without edema.  Chest: symmetric, with normal excursions Breasts: Symmetric, without lumps, nipple discharge, retractions.  Abdomen: Soft, nontender, no guarding, rebound, hernias, masses, or organomegaly.  Lymphatics: Non tender without lymphadenopathy.  Genitourinary:  Musculoskeletal: Full ROM all peripheral extremities,5/5 strength, and normal gait.  Skin: Warm, dry without rashes, lesions, ecchymosis. Neuro: Awake and oriented X 3, Cranial nerves intact, reflexes equal bilaterally. Normal muscle tone, no cerebellar symptoms. Sensation intact.  Psych:  normal affect, Insight and Judgment appropriate.   EKG: WNL no changes.   Over 40 minutes of exam, counseling, chart review and critical decision making was performed  Toni Amendourtney Forcucci 3:22 PM [facility]

## 2015-08-06 NOTE — Patient Instructions (Signed)

## 2015-08-07 ENCOUNTER — Telehealth: Payer: Self-pay

## 2015-08-07 ENCOUNTER — Other Ambulatory Visit: Payer: Self-pay | Admitting: Internal Medicine

## 2015-08-07 LAB — CBC WITH DIFFERENTIAL/PLATELET
Basophils Absolute: 0.1 10*3/uL (ref 0.0–0.1)
Basophils Relative: 1 % (ref 0–1)
EOS ABS: 0.1 10*3/uL (ref 0.0–0.7)
EOS PCT: 1 % (ref 0–5)
HCT: 42.3 % (ref 36.0–46.0)
Hemoglobin: 14.1 g/dL (ref 12.0–15.0)
Lymphocytes Relative: 31 % (ref 12–46)
Lymphs Abs: 2.4 10*3/uL (ref 0.7–4.0)
MCH: 29.5 pg (ref 26.0–34.0)
MCHC: 33.3 g/dL (ref 30.0–36.0)
MCV: 88.5 fL (ref 78.0–100.0)
MPV: 8.6 fL (ref 8.6–12.4)
Monocytes Absolute: 0.6 10*3/uL (ref 0.1–1.0)
Monocytes Relative: 8 % (ref 3–12)
Neutro Abs: 4.5 10*3/uL (ref 1.7–7.7)
Neutrophils Relative %: 59 % (ref 43–77)
Platelets: 294 10*3/uL (ref 150–400)
RBC: 4.78 MIL/uL (ref 3.87–5.11)
RDW: 14.5 % (ref 11.5–15.5)
WBC: 7.6 10*3/uL (ref 4.0–10.5)

## 2015-08-07 LAB — HEPATIC FUNCTION PANEL
ALBUMIN: 4.4 g/dL (ref 3.6–5.1)
ALK PHOS: 105 U/L (ref 33–130)
ALT: 17 U/L (ref 6–29)
AST: 20 U/L (ref 10–35)
BILIRUBIN DIRECT: 0.1 mg/dL (ref <=0.2)
BILIRUBIN TOTAL: 0.4 mg/dL (ref 0.2–1.2)
Indirect Bilirubin: 0.3 mg/dL (ref 0.2–1.2)
Total Protein: 7.5 g/dL (ref 6.1–8.1)

## 2015-08-07 LAB — BASIC METABOLIC PANEL WITH GFR
BUN: 12 mg/dL (ref 7–25)
CHLORIDE: 104 mmol/L (ref 98–110)
CO2: 25 mmol/L (ref 20–31)
Calcium: 9.4 mg/dL (ref 8.6–10.4)
Creat: 0.79 mg/dL (ref 0.50–0.99)
GFR, Est Non African American: 82 mL/min (ref >=60)
Glucose, Bld: 97 mg/dL (ref 65–99)
Potassium: 3.9 mmol/L (ref 3.5–5.3)
SODIUM: 141 mmol/L (ref 135–146)

## 2015-08-07 LAB — LIPID PANEL
CHOL/HDL RATIO: 4.3 ratio (ref <=5.0)
CHOLESTEROL: 260 mg/dL — AB (ref 125–200)
HDL: 60 mg/dL (ref >=46)
LDL Cholesterol: 136 mg/dL — ABNORMAL HIGH (ref <130)
TRIGLYCERIDES: 319 mg/dL — AB (ref <150)
VLDL: 64 mg/dL — ABNORMAL HIGH (ref <30)

## 2015-08-07 LAB — MICROALBUMIN / CREATININE URINE RATIO
Creatinine, Urine: 238 mg/dL (ref 20–320)
MICROALB UR: 1.6 mg/dL
MICROALB/CREAT RATIO: 7 ug/mg{creat} (ref <30)

## 2015-08-07 LAB — MAGNESIUM: Magnesium: 2.2 mg/dL (ref 1.5–2.5)

## 2015-08-07 LAB — IRON AND TIBC
%SAT: 18 % (ref 11–50)
IRON: 61 ug/dL (ref 45–160)
TIBC: 335 ug/dL (ref 250–450)
UIBC: 274 ug/dL (ref 125–400)

## 2015-08-07 LAB — URINALYSIS, ROUTINE W REFLEX MICROSCOPIC
Bilirubin Urine: NEGATIVE
Glucose, UA: NEGATIVE
HGB URINE DIPSTICK: NEGATIVE
LEUKOCYTES UA: NEGATIVE
NITRITE: NEGATIVE
PROTEIN: NEGATIVE
Specific Gravity, Urine: 1.02 (ref 1.001–1.035)
pH: 6 (ref 5.0–8.0)

## 2015-08-07 LAB — HEMOGLOBIN A1C
Hgb A1c MFr Bld: 5.4 % (ref <5.7)
Mean Plasma Glucose: 108 mg/dL (ref <117)

## 2015-08-07 LAB — TSH: TSH: 1.892 u[IU]/mL (ref 0.350–4.500)

## 2015-08-07 LAB — VITAMIN B12: Vitamin B-12: 355 pg/mL (ref 211–911)

## 2015-08-07 LAB — VITAMIN D 25 HYDROXY (VIT D DEFICIENCY, FRACTURES): VIT D 25 HYDROXY: 24 ng/mL — AB (ref 30–100)

## 2015-08-07 MED ORDER — DICLOFENAC SODIUM 1 % TD GEL: 2.0000 g | Freq: Four times a day (QID) | TRANSDERMAL | Status: DC

## 2015-08-07 NOTE — Telephone Encounter (Signed)
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 Requesting Voltaren topical gel. Please advise. [facility]

## 2015-08-15 ENCOUNTER — Other Ambulatory Visit: Payer: Self-pay | Admitting: Internal Medicine

## 2015-09-23 ENCOUNTER — Encounter: Payer: Self-pay | Admitting: Internal Medicine

## 2015-09-23 ENCOUNTER — Ambulatory Visit: Payer: Self-pay | Admitting: Physician Assistant

## 2015-09-23 ENCOUNTER — Ambulatory Visit (INDEPENDENT_AMBULATORY_CARE_PROVIDER_SITE_OTHER): Payer: Managed Care, Other (non HMO) | Admitting: Internal Medicine

## 2015-09-23 ENCOUNTER — Other Ambulatory Visit: Payer: Self-pay | Admitting: Internal Medicine

## 2015-09-23 VITALS — BP 150/98 | HR 92 | Temp 97.8°F | Resp 18 | Ht 68.75 in | Wt 193.0 lb

## 2015-09-23 DIAGNOSIS — J320 Chronic maxillary sinusitis: Secondary | ICD-10-CM

## 2015-09-23 MED ORDER — IPRATROPIUM BROMIDE 0.03 % NA SOLN: 2.0000 | Freq: Three times a day (TID) | NASAL | Status: DC

## 2015-09-23 MED ORDER — AMOXICILLIN-POT CLAVULANATE 875-125 MG PO TABS: 1.0000 | ORAL_TABLET | Freq: Two times a day (BID) | ORAL | Status: DC

## 2015-09-23 MED ORDER — PREDNISONE 20 MG PO TABS: ORAL_TABLET | ORAL | Status: DC

## 2015-09-23 NOTE — Patient Instructions (Signed)
Please start taking augmentin once in the morning with breakfast and once in the evening with dinner.  Please make sure you take with food.  If you get diarrhea with this medication call the office and let us know.  Please take prednisone in the morning with breakfast.  You can use the atrovent nasal spray up to 3 times per day to help with breathing.  Keep using saline.  Continue singulair and allegra.

## 2015-09-23 NOTE — Progress Notes (Signed)
Patient ID: Denise Jensen, female   DOB: [birth year], 59 y.o.   MRN: [id number]  HPI  Patient presents to the office for evaluation of nasal congestion and sinus pressure.  It has been going on for 1 months.  Patient reports minimal dry coughing.  They also endorse change in voice, postnasal drip and sinus congestion, sinus pressure, headache, and feeling like head was in a vice grip.  .  They have tried singulair, allegra, nasal saline.  They report that nothing has worked.  They denies other sick contacts.  Review of Systems  Constitutional: Negative for fever, chills and malaise/fatigue.  HENT: Positive for congestion, ear pain and nosebleeds. Negative for sore throat.   Eyes: Negative.   Respiratory: Negative for cough, shortness of breath and wheezing.   Cardiovascular: Negative for chest pain, palpitations and leg swelling.  Skin: Negative.   Neurological: Positive for headaches.    PE: Filed Vitals:   09/23/15 1125  BP: 150/98  Pulse: 92  Temp: 97.8 F (36.6 C)  Resp: 18   General:  Alert and non-toxic, WDWN, NAD HEENT: NCAT, PERLA, EOM normal, no occular discharge or erythema.  Nasal mucosal edema with sinus tenderness to palpation.  Oropharynx clear with minimal oropharyngeal edema and erythema.  Mucous membranes moist and pink. Possible erosion of right 1st molar around the existing filling.   Neck:  Cervical adenopathy Chest:  RRR no MRGs.  Lungs clear to auscultation A&P with no wheezes rhonchi or rales.   Abdomen: +BS x 4 quadrants, soft, non-tender, no guarding, rigidity, or rebound. Skin: warm and dry no rash Neuro: A&Ox4, CN II-XII grossly intact  Assessment and Plan:   1. Chronic maxillary sinusitis -augmentin -prednisone -atrovent -cont singulair -cont allegra -cont nasal saline -schedule visit with dentist Ambulatory referral to ENT

## 2015-10-28 ENCOUNTER — Encounter: Payer: Self-pay | Admitting: Physician Assistant

## 2015-10-28 ENCOUNTER — Ambulatory Visit (INDEPENDENT_AMBULATORY_CARE_PROVIDER_SITE_OTHER): Payer: Managed Care, Other (non HMO) | Admitting: Physician Assistant

## 2015-10-28 VITALS — BP 138/80 | HR 82 | Temp 97.2°F | Resp 16 | Ht 68.75 in | Wt 192.2 lb

## 2015-10-28 DIAGNOSIS — H8110 Benign paroxysmal vertigo, unspecified ear: Secondary | ICD-10-CM | POA: Diagnosis not present

## 2015-10-28 DIAGNOSIS — J320 Chronic maxillary sinusitis: Secondary | ICD-10-CM | POA: Diagnosis not present

## 2015-10-28 DIAGNOSIS — H9319 Tinnitus, unspecified ear: Secondary | ICD-10-CM

## 2015-10-28 DIAGNOSIS — H9193 Unspecified hearing loss, bilateral: Secondary | ICD-10-CM

## 2015-10-28 MED ORDER — LEVOFLOXACIN 500 MG PO TABS: 500.0000 mg | ORAL_TABLET | Freq: Every day | ORAL | Status: DC

## 2015-10-28 MED ORDER — PREDNISONE 20 MG PO TABS: ORAL_TABLET | ORAL | Status: DC

## 2015-10-28 NOTE — Patient Instructions (Signed)

## 2015-10-28 NOTE — Progress Notes (Signed)
Subjective:    Patient ID: Denise Jensen, female    DOB: October 06, 1955, 60 y.o.   MRN: [id number]  HPI 60 y.o. WF with history of chronic sinusitis since 2014 presents with continuing issues. She states she was last seen 12/19, had sinus issues, had vertigo, treated with prednisone, augmentin, singulair, allegra, she was referred to ENT but was unable to go due to back pain.   She continues to have sinus pressure, congestion, right sided pain, right ear pain, with vertigo on the right side, and she states she needs a root canal on the right side but does not want to do a root canal until after she can fix her sinuses. Prednisone does help. She states she does have constant tinnitus, vertigo, and some decreased hearing.   Blood pressure 138/80, pulse 82, temperature 97.2 F (36.2 C), temperature source Temporal, resp. rate 16, height 5' 8.75" (1.746 m), weight 192 lb 3.2 oz (87.181 kg), SpO2 97 %.  Past Medical History  Diagnosis Date  . Unspecified vitamin D deficiency   . GERD (gastroesophageal reflux disease)   . Unspecified hypothyroidism    Current Outpatient Prescriptions on File Prior to Visit  Medication Sig Dispense Refill  . Cholecalciferol (VITAMIN D-3) 5000 UNITS TABS Take 5,000 Units by mouth daily.    . diclofenac sodium (VOLTAREN) 1 % GEL Apply 2 g topically 4 (four) times daily. 100 g 2  . fexofenadine (ALLEGRA) 180 MG tablet Take 180 mg by mouth daily.    Marland Kitchen ipratropium (ATROVENT) 0.03 % nasal spray Place 2 sprays into the nose 3 (three) times daily. 30 mL 2  . levothyroxine (SYNTHROID, LEVOTHROID) 112 MCG tablet TAKE 1 TABLET BY MOUTH ONCE DAILY ON AN EMPTY STOMACH 30-60 MINUTES BEFORE A MEAL. 90 tablet 3  . montelukast (SINGULAIR) 10 MG tablet TAKE 1 TABLET BY MOUTH DAILY. 30 tablet 2  . omeprazole (PRILOSEC) 40 MG capsule TAKE 1 CAPSULE BY MOUTH DAILY. 90 capsule 1  . OVER THE COUNTER MEDICATION 500 mg daily. Tumeric     No current facility-administered medications on  file prior to visit.    Review of Systems  Constitutional: Negative for fever and chills.  HENT: Positive for congestion, ear pain, hearing loss, nosebleeds and tinnitus. Negative for sore throat.   Eyes: Negative.   Respiratory: Negative for cough, shortness of breath and wheezing.   Cardiovascular: Negative for chest pain, palpitations and leg swelling.  Skin: Negative.   Neurological: Positive for dizziness and headaches.       Objective:   Physical Exam  Constitutional: She is oriented to person, place, and time. She appears well-developed and well-nourished. No distress.  HENT:  Head: Normocephalic.  Nose: Mucosal edema present.  Mouth/Throat: Uvula is midline and oropharynx is clear and moist. No trismus in the jaw. No oropharyngeal exudate, posterior oropharyngeal edema or posterior oropharyngeal erythema.  Halitosis, visible post nasal drip, bilateral middle ear effusions  Eyes: Conjunctivae are normal. No scleral icterus.  Neck: Normal range of motion. Neck supple. No JVD present. No thyromegaly present.  Cardiovascular: Normal rate, regular rhythm, normal heart sounds and intact distal pulses.  Exam reveals no gallop and no friction rub.   No murmur heard. Pulmonary/Chest: Effort normal and breath sounds normal. No respiratory distress. She has no wheezes. She has no rales. She exhibits no tenderness.  Musculoskeletal: Normal range of motion.  Lymphadenopathy:    She has no cervical adenopathy.  Neurological: She is alert and oriented to person, place, and  time. She displays normal reflexes. No cranial nerve deficit. Coordination normal.  Skin: Skin is warm and dry. She is not diaphoretic.  Psychiatric: She has a normal mood and affect. Her behavior is normal. Judgment and thought content normal.  Nursing note and vitals reviewed.       Assessment & Plan:  Chronic sinusitis Prednisone, continue meds, levaquin, will refer to ENT  Vertigo Normal neuro, + tinnitus  right worse than left, vertigo, decreased hearing, declines MRI, will refer ENT for hearing eval to rule out acoustic neuroma.

## 2015-11-11 ENCOUNTER — Ambulatory Visit (INDEPENDENT_AMBULATORY_CARE_PROVIDER_SITE_OTHER): Payer: Managed Care, Other (non HMO) | Admitting: Internal Medicine

## 2015-11-11 ENCOUNTER — Encounter: Payer: Self-pay | Admitting: Internal Medicine

## 2015-11-11 VITALS — BP 136/92 | HR 84 | Temp 98.0°F | Resp 18 | Ht 68.75 in | Wt 195.0 lb

## 2015-11-11 DIAGNOSIS — E039 Hypothyroidism, unspecified: Secondary | ICD-10-CM

## 2015-11-11 DIAGNOSIS — R030 Elevated blood-pressure reading, without diagnosis of hypertension: Secondary | ICD-10-CM | POA: Diagnosis not present

## 2015-11-11 DIAGNOSIS — E559 Vitamin D deficiency, unspecified: Secondary | ICD-10-CM | POA: Diagnosis not present

## 2015-11-11 DIAGNOSIS — E782 Mixed hyperlipidemia: Secondary | ICD-10-CM

## 2015-11-11 DIAGNOSIS — Z79899 Other long term (current) drug therapy: Secondary | ICD-10-CM

## 2015-11-11 DIAGNOSIS — A09 Infectious gastroenteritis and colitis, unspecified: Secondary | ICD-10-CM | POA: Diagnosis not present

## 2015-11-11 DIAGNOSIS — R7309 Other abnormal glucose: Secondary | ICD-10-CM | POA: Diagnosis not present

## 2015-11-11 NOTE — Progress Notes (Signed)
Patient ID: Evans Lance, female   DOB: 1956-07-03, 60 y.o.   MRN: [id number]  Assessment and Plan:  Hypertension:  -Continue medication,  -monitor blood pressure at home.  -Continue DASH diet.   -Reminder to go to the ER if any CP, SOB, nausea, dizziness, severe HA, changes vision/speech, left arm numbness and tingling, and jaw pain.  Cholesterol: -Continue diet and exercise.  -Check cholesterol.   Pre-diabetes: -Continue diet and exercise.  -Check A1C  Vitamin D Def: -check level -continue medications.   Diarrhea -test stool for CDiff given recent abx usage.    Labs only visit next week secondary to time constraints today.  Continue diet and meds as discussed. Further disposition pending results of labs.  HPI 60 y.o. female  presents for 3 month follow up with hypertension, hyperlipidemia, prediabetes and vitamin D.   Her blood pressure has been controlled at home, today their BP is BP: (!) 136/92 mmHg.   She does not workout. She denies chest pain, shortness of breath, dizziness.   She is not on cholesterol medication and denies myalgias. Her cholesterol is not at goal. The cholesterol last visit was:   Lab Results  Component Value Date   CHOL 260* 08/06/2015   HDL 60 08/06/2015   LDLCALC 136* 08/06/2015   LDLDIRECT 75.3 09/11/2010   TRIG 319* 08/06/2015   CHOLHDL 4.3 08/06/2015     She has been working on diet and exercise for prediabetes, and denies foot ulcerations, hyperglycemia, hypoglycemia , increased appetite, nausea, paresthesia of the feet, polydipsia, polyuria, visual disturbances, vomiting and weight loss. Last A1C in the office was:  Lab Results  Component Value Date   HGBA1C 5.4 08/06/2015    Patient is on Vitamin D supplement.  Lab Results  Component Value Date   VD25OH 24* 08/06/2015      She reports that since the last time she was here her nasal congestion has improved, but she is having some diarrhea which has been happening since she  took the antibiotic.  It does seem to be improving.  She reports that she is still having 3 stools per day.  She reports that she is not interested to go see ENT.  She reports that she just doesn't think that she can tolerate it.    Current Medications:  Current Outpatient Prescriptions on File Prior to Visit  Medication Sig Dispense Refill  . Cholecalciferol (VITAMIN D-3) 5000 UNITS TABS Take 5,000 Units by mouth daily.    . diclofenac sodium (VOLTAREN) 1 % GEL Apply 2 g topically 4 (four) times daily. 100 g 2  . fexofenadine (ALLEGRA) 180 MG tablet Take 180 mg by mouth daily.    Marland Kitchen levothyroxine (SYNTHROID, LEVOTHROID) 112 MCG tablet TAKE 1 TABLET BY MOUTH ONCE DAILY ON AN EMPTY STOMACH 30-60 MINUTES BEFORE A MEAL. 90 tablet 3  . montelukast (SINGULAIR) 10 MG tablet TAKE 1 TABLET BY MOUTH DAILY. 30 tablet 2  . omeprazole (PRILOSEC) 40 MG capsule TAKE 1 CAPSULE BY MOUTH DAILY. 90 capsule 1  . OVER THE COUNTER MEDICATION 500 mg daily. Tumeric     No current facility-administered medications on file prior to visit.    Medical History:  Past Medical History  Diagnosis Date  . Unspecified vitamin D deficiency   . GERD (gastroesophageal reflux disease)   . Unspecified hypothyroidism     Allergies:  Allergies  Allergen Reactions  . Codeine     REACTION: hallucinations  . Mometasone Furoate     REACTION:  headache     Review of Systems:  Review of Systems  Constitutional: Negative for fever, chills and malaise/fatigue.  HENT: Positive for congestion. Negative for ear pain and sore throat.   Eyes: Negative.   Respiratory: Negative for cough, shortness of breath and wheezing.   Cardiovascular: Negative for chest pain, palpitations and leg swelling.  Gastrointestinal: Positive for diarrhea. Negative for heartburn, abdominal pain, constipation, blood in stool and melena.  Genitourinary: Negative.   Neurological: Positive for dizziness and headaches. Negative for sensory change and loss  of consciousness.  Psychiatric/Behavioral: Negative for depression. The patient is not nervous/anxious and does not have insomnia.     Family history- Review and unchanged  Social history- Review and unchanged  Physical Exam: BP 136/92 mmHg  Pulse 84  Temp(Src) 98 F (36.7 C) (Temporal)  Resp 18  Ht 5' 8.75" (1.746 m)  Wt 195 lb (88.451 kg)  BMI 29.01 kg/m2  LMP  (LMP Unknown) Wt Readings from Last 3 Encounters:  11/11/15 195 lb (88.451 kg)  10/28/15 192 lb 3.2 oz (87.181 kg)  09/23/15 193 lb (87.544 kg)   General Appearance: Well nourished well developed, in no apparent distress. Eyes: PERRLA, EOMs, conjunctiva no swelling or erythema ENT/Mouth: Ear canals normal without obstruction, swelling, erythma, discharge.  TMs normal bilaterally.  Oropharynx moist, clear, without exudate, or postoropharyngeal swelling. Neck: Supple, thyroid normal,no cervical adenopathy  Respiratory: Respiratory effort normal, Breath sounds clear A&P without rhonchi, wheeze, or rale.  No retractions, no accessory usage. Cardio: RRR with no MRGs. Brisk peripheral pulses without edema.  Abdomen: Soft, + BS,  Non tender, no guarding, rebound, hernias, masses. Musculoskeletal: Full ROM, 5/5 strength, Normal gait Skin: Warm, dry without rashes, lesions, ecchymosis.  Neuro: Awake and oriented X 3, Cranial nerves intact. Normal muscle tone, no cerebellar symptoms. Psych: Normal affect, Insight and Judgment appropriate.    Terri Piedra, PA-C 10:44 AM [facility]

## 2015-11-18 ENCOUNTER — Other Ambulatory Visit: Payer: Managed Care, Other (non HMO)

## 2015-11-18 ENCOUNTER — Other Ambulatory Visit: Payer: Self-pay | Admitting: Physician Assistant

## 2015-11-18 DIAGNOSIS — E039 Hypothyroidism, unspecified: Secondary | ICD-10-CM | POA: Diagnosis not present

## 2015-11-18 DIAGNOSIS — R7309 Other abnormal glucose: Secondary | ICD-10-CM | POA: Diagnosis not present

## 2015-11-18 DIAGNOSIS — R030 Elevated blood-pressure reading, without diagnosis of hypertension: Secondary | ICD-10-CM

## 2015-11-18 DIAGNOSIS — E782 Mixed hyperlipidemia: Secondary | ICD-10-CM

## 2015-11-18 DIAGNOSIS — Z79899 Other long term (current) drug therapy: Secondary | ICD-10-CM

## 2015-11-18 LAB — BASIC METABOLIC PANEL WITH GFR
BUN: 9 mg/dL (ref 7–25)
CHLORIDE: 102 mmol/L (ref 98–110)
CO2: 27 mmol/L (ref 20–31)
CREATININE: 0.81 mg/dL (ref 0.50–0.99)
Calcium: 9.6 mg/dL (ref 8.6–10.4)
GFR, Est African American: >89 mL/min (ref >=60)
GFR, Est Non African American: 79 mL/min (ref >=60)
GLUCOSE: 83 mg/dL (ref 65–99)
Potassium: 4 mmol/L (ref 3.5–5.3)
Sodium: 140 mmol/L (ref 135–146)

## 2015-11-18 LAB — HEMOGLOBIN A1C
HEMOGLOBIN A1C: 5.8 % — AB (ref <5.7)
Mean Plasma Glucose: 120 mg/dL — ABNORMAL HIGH (ref <117)

## 2015-11-18 LAB — CBC WITH DIFFERENTIAL/PLATELET
Basophils Absolute: 0 10*3/uL (ref 0.0–0.1)
Basophils Relative: 0 % (ref 0–1)
Eosinophils Absolute: 0.1 10*3/uL (ref 0.0–0.7)
Eosinophils Relative: 2 % (ref 0–5)
HCT: 42.4 % (ref 36.0–46.0)
HEMOGLOBIN: 14.2 g/dL (ref 12.0–15.0)
LYMPHS ABS: 2.2 10*3/uL (ref 0.7–4.0)
Lymphocytes Relative: 30 % (ref 12–46)
MCH: 29.5 pg (ref 26.0–34.0)
MCHC: 33.5 g/dL (ref 30.0–36.0)
MCV: 88.1 fL (ref 78.0–100.0)
MONO ABS: 0.6 10*3/uL (ref 0.1–1.0)
MONOS PCT: 9 % (ref 3–12)
MPV: 8.8 fL (ref 8.6–12.4)
NEUTROS ABS: 4.2 10*3/uL (ref 1.7–7.7)
NEUTROS PCT: 59 % (ref 43–77)
Platelets: 291 10*3/uL (ref 150–400)
RBC: 4.81 MIL/uL (ref 3.87–5.11)
RDW: 14.8 % (ref 11.5–15.5)
WBC: 7.2 10*3/uL (ref 4.0–10.5)

## 2015-11-18 LAB — HEPATIC FUNCTION PANEL
ALBUMIN: 4.1 g/dL (ref 3.6–5.1)
ALT: 17 U/L (ref 6–29)
AST: 19 U/L (ref 10–35)
Alkaline Phosphatase: 92 U/L (ref 33–130)
Bilirubin, Direct: 0.1 mg/dL (ref <=0.2)
Indirect Bilirubin: 0.4 mg/dL (ref 0.2–1.2)
TOTAL PROTEIN: 7.3 g/dL (ref 6.1–8.1)
Total Bilirubin: 0.5 mg/dL (ref 0.2–1.2)

## 2015-11-18 LAB — LIPID PANEL
Cholesterol: 300 mg/dL — ABNORMAL HIGH (ref 125–200)
HDL: 56 mg/dL (ref >=46)
LDL CALC: 187 mg/dL — AB (ref <130)
Total CHOL/HDL Ratio: 5.4 Ratio — ABNORMAL HIGH (ref <=5.0)
Triglycerides: 285 mg/dL — ABNORMAL HIGH (ref <150)
VLDL: 57 mg/dL — ABNORMAL HIGH (ref <30)

## 2015-11-18 LAB — TSH: TSH: 1.65 m[IU]/L

## 2015-12-10 ENCOUNTER — Other Ambulatory Visit: Payer: Self-pay | Admitting: *Deleted

## 2015-12-10 MED ORDER — PRAVASTATIN SODIUM 40 MG PO TABS: 40.0000 mg | ORAL_TABLET | Freq: Every evening | ORAL | Status: DC

## 2016-01-16 ENCOUNTER — Other Ambulatory Visit: Payer: Self-pay

## 2016-01-16 MED ORDER — PRAVASTATIN SODIUM 40 MG PO TABS: 40.0000 mg | ORAL_TABLET | Freq: Every evening | ORAL | Status: DC

## 2016-01-16 MED ORDER — OMEPRAZOLE 40 MG PO CPDR: 40.0000 mg | DELAYED_RELEASE_CAPSULE | Freq: Every day | ORAL | Status: DC

## 2016-01-16 MED ORDER — LEVOTHYROXINE SODIUM 112 MCG PO TABS: ORAL_TABLET | ORAL | Status: DC

## 2016-02-11 ENCOUNTER — Ambulatory Visit (INDEPENDENT_AMBULATORY_CARE_PROVIDER_SITE_OTHER): Payer: Managed Care, Other (non HMO) | Admitting: Internal Medicine

## 2016-02-11 ENCOUNTER — Encounter: Payer: Self-pay | Admitting: Internal Medicine

## 2016-02-11 VITALS — BP 138/90 | HR 88 | Temp 98.2°F | Resp 18 | Ht 68.75 in | Wt 192.0 lb

## 2016-02-11 DIAGNOSIS — E559 Vitamin D deficiency, unspecified: Secondary | ICD-10-CM

## 2016-02-11 DIAGNOSIS — R7309 Other abnormal glucose: Secondary | ICD-10-CM | POA: Diagnosis not present

## 2016-02-11 DIAGNOSIS — Z79899 Other long term (current) drug therapy: Secondary | ICD-10-CM | POA: Diagnosis not present

## 2016-02-11 DIAGNOSIS — E782 Mixed hyperlipidemia: Secondary | ICD-10-CM

## 2016-02-11 DIAGNOSIS — J301 Allergic rhinitis due to pollen: Secondary | ICD-10-CM

## 2016-02-11 DIAGNOSIS — E039 Hypothyroidism, unspecified: Secondary | ICD-10-CM | POA: Diagnosis not present

## 2016-02-11 LAB — CBC WITH DIFFERENTIAL/PLATELET
Basophils Absolute: 76 cells/uL (ref 0–200)
Basophils Relative: 1 %
Eosinophils Absolute: 76 cells/uL (ref 15–500)
Eosinophils Relative: 1 %
HCT: 39.8 % (ref 35.0–45.0)
Hemoglobin: 13.2 g/dL (ref 11.7–15.5)
LYMPHS ABS: 2052 {cells}/uL (ref 850–3900)
Lymphocytes Relative: 27 %
MCH: 29.2 pg (ref 27.0–33.0)
MCHC: 33.2 g/dL (ref 32.0–36.0)
MCV: 88.1 fL (ref 80.0–100.0)
MONO ABS: 456 {cells}/uL (ref 200–950)
MONOS PCT: 6 %
MPV: 8.6 fL (ref 7.5–12.5)
Neutro Abs: 4940 cells/uL (ref 1500–7800)
Neutrophils Relative %: 65 %
PLATELETS: 277 10*3/uL (ref 140–400)
RBC: 4.52 MIL/uL (ref 3.80–5.10)
RDW: 14.2 % (ref 11.0–15.0)
WBC: 7.6 10*3/uL (ref 3.8–10.8)

## 2016-02-11 LAB — HEMOGLOBIN A1C
Hgb A1c MFr Bld: 5.3 % (ref <5.7)
Mean Plasma Glucose: 105 mg/dL

## 2016-02-11 LAB — BASIC METABOLIC PANEL WITH GFR
BUN: 14 mg/dL (ref 7–25)
CHLORIDE: 105 mmol/L (ref 98–110)
CO2: 25 mmol/L (ref 20–31)
CREATININE: 1 mg/dL — AB (ref 0.50–0.99)
Calcium: 9.1 mg/dL (ref 8.6–10.4)
GFR, Est African American: 70 mL/min (ref >=60)
GFR, Est Non African American: 61 mL/min (ref >=60)
GLUCOSE: 89 mg/dL (ref 65–99)
Potassium: 4.2 mmol/L (ref 3.5–5.3)
Sodium: 140 mmol/L (ref 135–146)

## 2016-02-11 LAB — HEPATIC FUNCTION PANEL
ALBUMIN: 4 g/dL (ref 3.6–5.1)
ALT: 15 U/L (ref 6–29)
AST: 18 U/L (ref 10–35)
Alkaline Phosphatase: 94 U/L (ref 33–130)
BILIRUBIN DIRECT: 0.1 mg/dL (ref <=0.2)
BILIRUBIN TOTAL: 0.3 mg/dL (ref 0.2–1.2)
Indirect Bilirubin: 0.2 mg/dL (ref 0.2–1.2)
Total Protein: 7 g/dL (ref 6.1–8.1)

## 2016-02-11 LAB — LIPID PANEL
CHOL/HDL RATIO: 4.8 ratio (ref <=5.0)
CHOLESTEROL: 250 mg/dL — AB (ref 125–200)
HDL: 52 mg/dL (ref >=46)
LDL Cholesterol: 134 mg/dL — ABNORMAL HIGH (ref <130)
TRIGLYCERIDES: 319 mg/dL — AB (ref <150)
VLDL: 64 mg/dL — AB (ref <30)

## 2016-02-11 LAB — TSH: TSH: 1.17 mIU/L

## 2016-02-11 NOTE — Progress Notes (Signed)
Assessment and Plan:  Hypertension:  -Continue medication,  -monitor blood pressure at home.  -Continue DASH diet.   -Reminder to go to the ER if any CP, SOB, nausea, dizziness, severe HA, changes vision/speech, left arm numbness and tingling, and jaw pain.  Cholesterol: -Continue diet and exercise.  -Check cholesterol.   Pre-diabetes: -Continue diet and exercise.  -Check A1C  Vitamin D Def: -check level -continue medications.   Left Foot Mass -questionable ganglion cyst.   -recommended she see her podiatrist  Continue diet and meds as discussed. Further disposition pending results of labs.  HPI 60 y.o. female  presents for 3 month follow up with hypertension, hyperlipidemia, prediabetes and vitamin D.   Her blood pressure has been controlled at home, today their BP is BP: 138/90 mmHg.   She does workout. She denies chest pain, shortness of breath, dizziness.    She is on cholesterol medication and denies myalgias. Her cholesterol is not at goal. The cholesterol last visit was:   Lab Results  Component Value Date   CHOL 300* 11/18/2015   HDL 56 11/18/2015   LDLCALC 187* 11/18/2015   LDLDIRECT 75.3 09/11/2010   TRIG 285* 11/18/2015   CHOLHDL 5.4* 11/18/2015     She has been working on diet and exercise for prediabetes, and denies foot ulcerations, hyperglycemia, hypoglycemia , increased appetite, nausea, paresthesia of the feet, polydipsia, polyuria, visual disturbances, vomiting and weight loss. Last A1C in the office was:  Lab Results  Component Value Date   HGBA1C 5.8* 11/18/2015    Patient is on Vitamin D supplement.  Lab Results  Component Value Date   VD25OH 24* 08/06/2015      She reports that she did go to the eye doctor today and had a dilated eye appointment.    She reports that she is doing some more physical work.  She reports that she is slowly working back up to being physically active.    She notes that she does have a mass on her foot.  It does  change in size.  It is painless but does make her shoes feel like they are a lot tighter.    Current Medications:  Current Outpatient Prescriptions on File Prior to Visit  Medication Sig Dispense Refill  . Cholecalciferol (VITAMIN D-3) 5000 UNITS TABS Take 5,000 Units by mouth daily.    . diclofenac sodium (VOLTAREN) 1 % GEL Apply 2 g topically 4 (four) times daily. 100 g 2  . fexofenadine (ALLEGRA) 180 MG tablet Take 180 mg by mouth daily.    Marland Kitchen levothyroxine (SYNTHROID, LEVOTHROID) 112 MCG tablet TAKE 1 TABLET BY MOUTH ONCE DAILY ON AN EMPTY STOMACH 30-60 MINUTES BEFORE A MEAL. 90 tablet 3  . omeprazole (PRILOSEC) 40 MG capsule Take 1 capsule (40 mg total) by mouth daily. 90 capsule 1  . OVER THE COUNTER MEDICATION 500 mg daily. Tumeric    . pravastatin (PRAVACHOL) 40 MG tablet Take 1 tablet (40 mg total) by mouth every evening. 30 tablet 1   No current facility-administered medications on file prior to visit.    Medical History:  Past Medical History  Diagnosis Date  . Unspecified vitamin D deficiency   . GERD (gastroesophageal reflux disease)   . Unspecified hypothyroidism     Allergies:  Allergies  Allergen Reactions  . Codeine     REACTION: hallucinations  . Mometasone Furoate     REACTION: headache     Review of Systems:  Review of Systems  Constitutional: Negative  for fever, chills and malaise/fatigue.  HENT: Negative for congestion, ear pain and sore throat.   Respiratory: Negative for cough, shortness of breath and wheezing.   Cardiovascular: Negative for chest pain, palpitations and leg swelling.  Gastrointestinal: Negative for heartburn, abdominal pain, diarrhea, constipation, blood in stool and melena.  Genitourinary: Negative.   Skin: Negative.   Neurological: Negative for dizziness, sensory change, loss of consciousness and headaches.  Psychiatric/Behavioral: Negative for depression. The patient is not nervous/anxious and does not have insomnia.     Family  history- Review and unchanged  Social history- Review and unchanged  Physical Exam: BP 138/90 mmHg  Pulse 88  Temp(Src) 98.2 F (36.8 C) (Temporal)  Resp 18  Ht 5' 8.75" (1.746 m)  Wt 192 lb (87.091 kg)  BMI 28.57 kg/m2  LMP  (LMP Unknown) Wt Readings from Last 3 Encounters:  02/11/16 192 lb (87.091 kg)  11/11/15 195 lb (88.451 kg)  10/28/15 192 lb 3.2 oz (87.181 kg)    General Appearance: Well nourished well developed, in no apparent distress. Eyes: PERRLA, EOMs, conjunctiva no swelling or erythema ENT/Mouth: Ear canals normal without obstruction, swelling, erythma, discharge.  TMs normal bilaterally.  Oropharynx moist, clear, without exudate, or postoropharyngeal swelling. Neck: Supple, thyroid normal,no cervical adenopathy  Respiratory: Respiratory effort normal, Breath sounds clear A&P without rhonchi, wheeze, or rale.  No retractions, no accessory usage. Cardio: RRR with no MRGs. Brisk peripheral pulses without edema.  Abdomen: Soft, + BS,  Non tender, no guarding, rebound, hernias, masses. Musculoskeletal: Full ROM, 5/5 strength, Normal gait.  2 cm x 1 cm soft, freely mobile, mass to the left lateral foot.   Skin: Warm, dry without rashes, lesions, ecchymosis.  Neuro: Awake and oriented X 3, Cranial nerves intact. Normal muscle tone, no cerebellar symptoms. Psych: Normal affect, Insight and Judgment appropriate.    Terri Piedraourtney Forcucci, PA-C 2:41 PM [facility]

## 2016-05-13 ENCOUNTER — Encounter: Payer: Self-pay | Admitting: Internal Medicine

## 2016-05-13 ENCOUNTER — Ambulatory Visit (INDEPENDENT_AMBULATORY_CARE_PROVIDER_SITE_OTHER): Payer: Managed Care, Other (non HMO) | Admitting: Internal Medicine

## 2016-05-13 VITALS — BP 140/98 | HR 90 | Temp 98.2°F | Resp 18 | Ht 68.75 in | Wt 190.0 lb

## 2016-05-13 DIAGNOSIS — Z79899 Other long term (current) drug therapy: Secondary | ICD-10-CM | POA: Diagnosis not present

## 2016-05-13 DIAGNOSIS — R002 Palpitations: Secondary | ICD-10-CM

## 2016-05-13 DIAGNOSIS — R7309 Other abnormal glucose: Secondary | ICD-10-CM

## 2016-05-13 DIAGNOSIS — E782 Mixed hyperlipidemia: Secondary | ICD-10-CM | POA: Diagnosis not present

## 2016-05-13 DIAGNOSIS — H8110 Benign paroxysmal vertigo, unspecified ear: Secondary | ICD-10-CM

## 2016-05-13 DIAGNOSIS — E039 Hypothyroidism, unspecified: Secondary | ICD-10-CM | POA: Diagnosis not present

## 2016-05-13 DIAGNOSIS — R030 Elevated blood-pressure reading, without diagnosis of hypertension: Secondary | ICD-10-CM | POA: Diagnosis not present

## 2016-05-13 DIAGNOSIS — E559 Vitamin D deficiency, unspecified: Secondary | ICD-10-CM

## 2016-05-13 LAB — CBC WITH DIFFERENTIAL/PLATELET
Basophils Absolute: 82 cells/uL (ref 0–200)
Basophils Relative: 1 %
EOS ABS: 82 {cells}/uL (ref 15–500)
Eosinophils Relative: 1 %
HCT: 40.5 % (ref 35.0–45.0)
HEMOGLOBIN: 13.6 g/dL (ref 11.7–15.5)
Lymphocytes Relative: 28 %
Lymphs Abs: 2296 cells/uL (ref 850–3900)
MCH: 29.2 pg (ref 27.0–33.0)
MCHC: 33.6 g/dL (ref 32.0–36.0)
MCV: 87.1 fL (ref 80.0–100.0)
MPV: 8.4 fL (ref 7.5–12.5)
Monocytes Absolute: 656 cells/uL (ref 200–950)
Monocytes Relative: 8 %
NEUTROS ABS: 5084 {cells}/uL (ref 1500–7800)
NEUTROS PCT: 62 %
Platelets: 298 10*3/uL (ref 140–400)
RBC: 4.65 MIL/uL (ref 3.80–5.10)
RDW: 14.8 % (ref 11.0–15.0)
WBC: 8.2 10*3/uL (ref 3.8–10.8)

## 2016-05-13 LAB — TSH: TSH: 1.81 m[IU]/L

## 2016-05-13 MED ORDER — FEXOFENADINE HCL 180 MG PO TABS: 180.0000 mg | ORAL_TABLET | Freq: Every day | ORAL | 2 refills | Status: DC

## 2016-05-13 MED ORDER — CHLORPHEN-PE-ACETAMINOPHEN 4-10-325 MG PO TABS: 1.0000 | ORAL_TABLET | Freq: Three times a day (TID) | ORAL | 0 refills | Status: DC

## 2016-05-13 NOTE — Progress Notes (Signed)
Assessment and Plan:  Hypertension:  -Continue medication -monitor blood pressure at home. -Continue DASH diet -Reminder to go to the ER if any CP, SOB, nausea, dizziness, severe HA, changes vision/speech, left arm numbness and tingling and jaw pain.  Cholesterol - Continue diet and exercise -Check cholesterol.   PreDiabetes -Continue diet and exercise.  -Check A1C  Vitamin D Def -check level -continue medications.   Hypothyroidism -cont levothyroxine -tsh  BPPV -norel AD -cont allegra -recommended seeing ENT if still having vertigo  Continue diet and meds as discussed. Further disposition pending results of labs. Discussed med's effects and SE's.    HPI 60 y.o. female  presents for 3 month follow up with hypertension, hyperlipidemia, diabetes and vitamin D deficiency.   Her blood pressure has been controlled at home, today their BP is BP: (!) 140/98.She does workout. She denies chest pain, shortness of breath, dizziness.   She is on cholesterol medication and denies myalgias. Her cholesterol is at goal. The cholesterol was:  02/11/2016: Cholesterol 250; HDL 52; LDL Cholesterol 134; Triglycerides 319   She has been working on diet and exercise for prediabetes without complications, she is on bASA, she is on ACE/ARB, and denies  foot ulcerations, hyperglycemia, hypoglycemia , increased appetite, nausea, paresthesia of the feet, polydipsia, polyuria, visual disturbances and vomiting. Last A1C was: 02/11/2016: Hgb A1c MFr Bld 5.3   Patient is on Vitamin D supplement. 08/06/2015: Vit D, 25-Hydroxy 24  Patient reports that she has not yet gone to the ENT because she has been doing the salt water rinses in her nose.  She is doing Careers adviserallegra.  She is having ear pain.  She reports that she has not had vertigo recently.  She reports that she does do well with the netti pot.    She is taking her thyroid medication on an empty stomach daily and is taking her medication and   Current  Medications:  Current Outpatient Prescriptions on File Prior to Visit  Medication Sig Dispense Refill  . Cholecalciferol (VITAMIN D-3) 5000 UNITS TABS Take 5,000 Units by mouth daily.    . diclofenac sodium (VOLTAREN) 1 % GEL Apply 2 g topically 4 (four) times daily. 100 g 2  . levothyroxine (SYNTHROID, LEVOTHROID) 112 MCG tablet TAKE 1 TABLET BY MOUTH ONCE DAILY ON AN EMPTY STOMACH 30-60 MINUTES BEFORE A MEAL. 90 tablet 3  . omeprazole (PRILOSEC) 40 MG capsule Take 1 capsule (40 mg total) by mouth daily. 90 capsule 1  . OVER THE COUNTER MEDICATION 500 mg daily. Tumeric    . pravastatin (PRAVACHOL) 40 MG tablet Take 1 tablet (40 mg total) by mouth every evening. 30 tablet 1   No current facility-administered medications on file prior to visit.    Medical History:  Past Medical History:  Diagnosis Date  . GERD (gastroesophageal reflux disease)   . Unspecified hypothyroidism   . Unspecified vitamin D deficiency    Allergies:  Allergies  Allergen Reactions  . Codeine     REACTION: hallucinations  . Mometasone Furoate     REACTION: headache     Review of Systems:  Review of Systems  Constitutional: Negative for chills, fever and malaise/fatigue.  HENT: Negative for congestion, ear pain and sore throat.   Eyes: Negative.   Respiratory: Negative for cough, shortness of breath and wheezing.   Cardiovascular: Negative for chest pain, palpitations and leg swelling.  Gastrointestinal: Negative for abdominal pain, blood in stool, constipation, diarrhea, heartburn and melena.  Genitourinary: Negative.   Skin:  Negative.   Neurological: Negative for dizziness, sensory change, loss of consciousness and headaches.  Psychiatric/Behavioral: Negative for depression. The patient is not nervous/anxious and does not have insomnia.     Family history- Review and unchanged  Social history- Review and unchanged  Physical Exam: BP (!) 140/98   Pulse 90   Temp 98.2 F (36.8 C) (Temporal)    Resp 18   Ht 5' 8.75" (1.746 m)   Wt 190 lb (86.2 kg)   LMP  (LMP Unknown)   BMI 28.26 kg/m  Wt Readings from Last 3 Encounters:  05/13/16 190 lb (86.2 kg)  02/11/16 192 lb (87.1 kg)  11/11/15 195 lb (88.5 kg)   General Appearance: Well nourished well developed, non-toxic appearing, in no apparent distress. Eyes: PERRLA, EOMs, conjunctiva no swelling or erythema ENT/Mouth: Ear canals clear with no erythema, swelling, or discharge.  TMs normal bilaterally, oropharynx clear, moist, with no exudate.   Neck: Supple, thyroid normal, no JVD, no cervical adenopathy.  Respiratory: Respiratory effort normal, breath sounds clear A&P, no wheeze, rhonchi or rales noted.  No retractions, no accessory muscle usage Cardio: RRR with no MRGs. No noted edema.  Abdomen: Soft, + BS.  Non tender, no guarding, rebound, hernias, masses. Musculoskeletal: Full ROM, 5/5 strength, Normal gait Skin: Warm, dry without rashes, lesions, ecchymosis.  Neuro: Awake and oriented X 3, Cranial nerves intact. No cerebellar symptoms.  Psych: normal affect, Insight and Judgment appropriate.    Terri Piedra, PA-C 3:57 PM [facility]

## 2016-05-14 LAB — BASIC METABOLIC PANEL WITH GFR
BUN: 17 mg/dL (ref 7–25)
CHLORIDE: 104 mmol/L (ref 98–110)
CO2: 23 mmol/L (ref 20–31)
Calcium: 9.9 mg/dL (ref 8.6–10.4)
Creat: 0.88 mg/dL (ref 0.50–0.99)
GFR, EST NON AFRICAN AMERICAN: 71 mL/min (ref >=60)
GFR, Est African American: 82 mL/min (ref >=60)
Glucose, Bld: 85 mg/dL (ref 65–99)
Potassium: 4.7 mmol/L (ref 3.5–5.3)
SODIUM: 141 mmol/L (ref 135–146)

## 2016-05-14 LAB — LIPID PANEL
CHOL/HDL RATIO: 4.6 ratio (ref <=5.0)
Cholesterol: 262 mg/dL — ABNORMAL HIGH (ref 125–200)
HDL: 57 mg/dL (ref >=46)
LDL CALC: 144 mg/dL — AB (ref <130)
TRIGLYCERIDES: 305 mg/dL — AB (ref <150)
VLDL: 61 mg/dL — AB (ref <30)

## 2016-05-14 LAB — HEPATIC FUNCTION PANEL
ALT: 14 U/L (ref 6–29)
AST: 17 U/L (ref 10–35)
Albumin: 4.3 g/dL (ref 3.6–5.1)
Alkaline Phosphatase: 107 U/L (ref 33–130)
BILIRUBIN DIRECT: 0.1 mg/dL (ref <=0.2)
BILIRUBIN INDIRECT: 0.3 mg/dL (ref 0.2–1.2)
Total Bilirubin: 0.4 mg/dL (ref 0.2–1.2)
Total Protein: 7.3 g/dL (ref 6.1–8.1)

## 2016-05-14 LAB — HEMOGLOBIN A1C
Hgb A1c MFr Bld: 5.4 % (ref <5.7)
Mean Plasma Glucose: 108 mg/dL

## 2016-08-05 ENCOUNTER — Encounter: Payer: Self-pay | Admitting: Internal Medicine

## 2016-09-07 ENCOUNTER — Ambulatory Visit (INDEPENDENT_AMBULATORY_CARE_PROVIDER_SITE_OTHER): Payer: Managed Care, Other (non HMO) | Admitting: Internal Medicine

## 2016-09-07 ENCOUNTER — Encounter: Payer: Self-pay | Admitting: Internal Medicine

## 2016-09-07 VITALS — BP 140/96 | HR 66 | Temp 98.2°F | Resp 16 | Ht 68.0 in | Wt 192.0 lb

## 2016-09-07 DIAGNOSIS — Z1389 Encounter for screening for other disorder: Secondary | ICD-10-CM

## 2016-09-07 DIAGNOSIS — R002 Palpitations: Secondary | ICD-10-CM

## 2016-09-07 DIAGNOSIS — Z136 Encounter for screening for cardiovascular disorders: Secondary | ICD-10-CM | POA: Diagnosis not present

## 2016-09-07 DIAGNOSIS — Z23 Encounter for immunization: Secondary | ICD-10-CM | POA: Diagnosis not present

## 2016-09-07 DIAGNOSIS — M797 Fibromyalgia: Secondary | ICD-10-CM

## 2016-09-07 DIAGNOSIS — E559 Vitamin D deficiency, unspecified: Secondary | ICD-10-CM

## 2016-09-07 DIAGNOSIS — Z0000 Encounter for general adult medical examination without abnormal findings: Secondary | ICD-10-CM | POA: Diagnosis not present

## 2016-09-07 DIAGNOSIS — I10 Essential (primary) hypertension: Secondary | ICD-10-CM

## 2016-09-07 DIAGNOSIS — R7309 Other abnormal glucose: Secondary | ICD-10-CM

## 2016-09-07 DIAGNOSIS — E039 Hypothyroidism, unspecified: Secondary | ICD-10-CM

## 2016-09-07 DIAGNOSIS — Z130 Encounter for screening for diseases of the blood and blood-forming organs and certain disorders involving the immune mechanism: Secondary | ICD-10-CM

## 2016-09-07 DIAGNOSIS — N6320 Unspecified lump in the left breast, unspecified quadrant: Secondary | ICD-10-CM

## 2016-09-07 DIAGNOSIS — J0141 Acute recurrent pansinusitis: Secondary | ICD-10-CM

## 2016-09-07 DIAGNOSIS — E782 Mixed hyperlipidemia: Secondary | ICD-10-CM

## 2016-09-07 DIAGNOSIS — Z79899 Other long term (current) drug therapy: Secondary | ICD-10-CM

## 2016-09-07 DIAGNOSIS — Z0001 Encounter for general adult medical examination with abnormal findings: Secondary | ICD-10-CM

## 2016-09-07 LAB — CBC WITH DIFFERENTIAL/PLATELET
Basophils Absolute: 75 {cells}/uL (ref 0–200)
Basophils Relative: 1 %
Eosinophils Absolute: 150 {cells}/uL (ref 15–500)
Eosinophils Relative: 2 %
HCT: 43.1 % (ref 35.0–45.0)
Hemoglobin: 14.2 g/dL (ref 11.7–15.5)
Lymphocytes Relative: 25 %
Lymphs Abs: 1875 {cells}/uL (ref 850–3900)
MCH: 29.3 pg (ref 27.0–33.0)
MCHC: 32.9 g/dL (ref 32.0–36.0)
MCV: 89 fL (ref 80.0–100.0)
MPV: 8.7 fL (ref 7.5–12.5)
Monocytes Absolute: 450 {cells}/uL (ref 200–950)
Monocytes Relative: 6 %
Neutro Abs: 4950 {cells}/uL (ref 1500–7800)
Neutrophils Relative %: 66 %
Platelets: 302 K/uL (ref 140–400)
RBC: 4.84 MIL/uL (ref 3.80–5.10)
RDW: 14.5 % (ref 11.0–15.0)
WBC: 7.5 K/uL (ref 3.8–10.8)

## 2016-09-07 LAB — HEPATIC FUNCTION PANEL
ALBUMIN: 4.3 g/dL (ref 3.6–5.1)
ALK PHOS: 117 U/L (ref 33–130)
ALT: 15 U/L (ref 6–29)
AST: 18 U/L (ref 10–35)
BILIRUBIN DIRECT: 0.1 mg/dL (ref <=0.2)
BILIRUBIN TOTAL: 0.4 mg/dL (ref 0.2–1.2)
Indirect Bilirubin: 0.3 mg/dL (ref 0.2–1.2)
Total Protein: 7.5 g/dL (ref 6.1–8.1)

## 2016-09-07 LAB — HEMOGLOBIN A1C
HEMOGLOBIN A1C: 5.4 % (ref <5.7)
MEAN PLASMA GLUCOSE: 108 mg/dL

## 2016-09-07 LAB — BASIC METABOLIC PANEL WITH GFR
BUN: 15 mg/dL (ref 7–25)
CHLORIDE: 103 mmol/L (ref 98–110)
CO2: 28 mmol/L (ref 20–31)
CREATININE: 0.8 mg/dL (ref 0.50–0.99)
Calcium: 9.5 mg/dL (ref 8.6–10.4)
GFR, Est African American: >89 mL/min (ref >=60)
GFR, Est Non African American: 80 mL/min (ref >=60)
Glucose, Bld: 83 mg/dL (ref 65–99)
POTASSIUM: 3.9 mmol/L (ref 3.5–5.3)
Sodium: 140 mmol/L (ref 135–146)

## 2016-09-07 LAB — LIPID PANEL
CHOL/HDL RATIO: 4.5 ratio (ref <5.0)
Cholesterol: 254 mg/dL — ABNORMAL HIGH (ref <200)
HDL: 57 mg/dL (ref >50)
LDL CALC: 135 mg/dL — AB (ref <100)
Triglycerides: 308 mg/dL — ABNORMAL HIGH (ref <150)
VLDL: 62 mg/dL — ABNORMAL HIGH (ref <30)

## 2016-09-07 LAB — IRON AND TIBC
%SAT: 19 % (ref 11–50)
Iron: 73 ug/dL (ref 45–160)
TIBC: 376 ug/dL (ref 250–450)
UIBC: 303 ug/dL (ref 125–400)

## 2016-09-07 LAB — MAGNESIUM: MAGNESIUM: 2.2 mg/dL (ref 1.5–2.5)

## 2016-09-07 LAB — TSH: TSH: 3.24 m[IU]/L

## 2016-09-07 LAB — VITAMIN B12: VITAMIN B 12: 298 pg/mL (ref 200–1100)

## 2016-09-07 MED ORDER — NEOMYCIN-POLYMYXIN-HC 3.5-10000-1 OT SUSP: 4.0000 [drp] | Freq: Four times a day (QID) | OTIC | 0 refills | Status: DC

## 2016-09-07 MED ORDER — AMOXICILLIN-POT CLAVULANATE 875-125 MG PO TABS: 1.0000 | ORAL_TABLET | Freq: Two times a day (BID) | ORAL | 0 refills | Status: DC

## 2016-09-07 MED ORDER — AZELASTINE HCL 0.1 % NA SOLN: 2.0000 | Freq: Two times a day (BID) | NASAL | 2 refills | Status: DC

## 2016-09-07 MED ORDER — PREDNISONE 20 MG PO TABS: ORAL_TABLET | ORAL | 0 refills | Status: DC

## 2016-09-07 NOTE — Patient Instructions (Signed)
Please take both the augmentin and the prednisone until they are both gone.  Continue to use the flonase 2 sprays per nostril.  Please take the astelin spray 2 sprays per nostril in the mornings and the evenings.  Please continue to take the allegra.  Take sudafed once every 8 hours.

## 2016-09-07 NOTE — Progress Notes (Signed)
Complete Physical  Assessment and Plan:   1. Encounter for general adult medical examination with abnormal findings -due next year -patient had breast exam, but will need to update mammogram, pap smear, and colonoscopy -patient refused colonoscopy but will consent to cologuard when there is better insurance coverage  2. Fibromyalgia -cont stretching and exercise -is currently walking  3. Hypothyroidism, unspecified type -cont levothyroxine - TSH  4. Abnormal glucose -cont diet and exercise - Hemoglobin A1c - Insulin, random  5. Mixed hyperlipidemia -will need low dose statin -patient has not been taking - Lipid panel  6. Palpitations -not currently symptomatic - EKG 12-Lead  7. Vitamin D deficiency -cont supplement - VITAMIN D 25 Hydroxy (Vit-D Deficiency, Fractures)  8. Medication management  - CBC with Differential/Platelet - BASIC METABOLIC PANEL WITH GFR - Hepatic function panel - Magnesium  9. Screening for deficiency anemia  - Iron and TIBC - Vitamin B12  10. Screening for hematuria or proteinuria  - Urinalysis, Routine w reflex microscopic (not at [facility]) - Microalbumin / creatinine urine ratio  11. Need for prophylactic vaccination and inoculation against influenza  - Flu Vaccine QUAD with presevative  12. Acute recurrent pansinusitis -agumentin -flonase -astelin -ciprodex for right ear -cont allegra -nasal saline  13.  Left breast mass -bilateral tomo mammogram diagnostic  Discussed med's effects and SE's. Screening labs and tests as requested with regular follow-up as recommended.  HPI  60 y.o. female  presents for a complete physical.  Her blood pressure has been controlled at home, today their BP is BP: (!) 140/96.  She does not workout. She denies chest pain, shortness of breath, dizziness.   She is not on cholesterol medication and denies myalgias. Her cholesterol is at goal. The cholesterol last visit was:  Lab Results   Component Value Date   CHOL 262 (H) 05/13/2016   HDL 57 05/13/2016   LDLCALC 144 (H) 05/13/2016   LDLDIRECT 75.3 09/11/2010   TRIG 305 (H) 05/13/2016   CHOLHDL 4.6 05/13/2016  .  She has been working on diet and exercise for prediabetes, she is on bASA, she is not on ACE/ARB and denies foot ulcerations, hyperglycemia, hypoglycemia , increased appetite, nausea, paresthesia of the feet, polydipsia, polyuria, visual disturbances, vomiting and weight loss. Last A1C in the office was:  Lab Results  Component Value Date   HGBA1C 5.4 05/13/2016    Patient is on Vitamin D supplement.   Lab Results  Component Value Date   VD25OH 24 (L) 08/06/2015     Patient reports that she has been having some dental pain and some right ear pain.  She reports that this has been going on for the past 2 weeks.  She reports that she has been having some congestion and some drainage out of her ears.  She reports that her eyes were getting puffy and swelling.  She reports that when she does get some congestion up it is yellow green and thick.  She is doing a netti pot and has also been doing some OTC meds.  She has been taking advil.  She is taking the allegra.  She is also taking flonase.    She has not seen obgyn recently.  Her last pap smear appears to be in 2008.  She has not had one since then.  She reports that   Current Medications:  Current Outpatient Prescriptions on File Prior to Visit  Medication Sig Dispense Refill  . fexofenadine (ALLEGRA) 180 MG tablet Take 1 tablet (180  mg total) by mouth daily. 90 tablet 2  . levothyroxine (SYNTHROID, LEVOTHROID) 112 MCG tablet TAKE 1 TABLET BY MOUTH ONCE DAILY ON AN EMPTY STOMACH 30-60 MINUTES BEFORE A MEAL. 90 tablet 3  . diclofenac sodium (VOLTAREN) 1 % GEL Apply 2 g topically 4 (four) times daily. (Patient not taking: Reported on 09/07/2016) 100 g 2   No current facility-administered medications on file prior to visit.     Health Maintenance:    Immunization History  Administered Date(s) Administered  . Influenza Split 07/10/2014, 08/06/2015  . Influenza Whole 07/06/2007  . Td 10/05/2001  . Tdap 08/06/2015    Tetanus: 2016 Pneumovax: Not indicated Flu vaccine: 2017, Pap: 2008, due at next visit MGM: Overdue: Colonoscopy: refused.  Will want to do cologuard when financially feasible Last Dental Exam:  2017 Last Eye Exam:  Combine Opthalmology  Patient Care Team: Lucky CowboyWilliam McKeown, MD as PCP - General (Internal Medicine)  Allergies:  Allergies  Allergen Reactions  . Codeine     REACTION: hallucinations  . Mometasone Furoate     REACTION: headache    Medical History:  Past Medical History:  Diagnosis Date  . GERD (gastroesophageal reflux disease)   . Unspecified hypothyroidism   . Unspecified vitamin D deficiency     Surgical History: No past surgical history on file.  Family History:  Family History  Problem Relation Age of Onset  . Depression Mother   . Arthritis Mother   . Hypertension Mother   . COPD Mother   . Kidney disease Mother   . Heart disease Father   . Heart disease Sister   . Depression Sister   . Cancer Sister     breast    Social History:  Social History  Substance Use Topics  . Smoking status: Former Smoker    Quit date: 10/05/1981  . Smokeless tobacco: Not on file  . Alcohol use No    Review of Systems: Review of Systems  Constitutional: Negative for chills, fever and malaise/fatigue.  HENT: Positive for congestion, ear pain, hearing loss and sinus pain. Negative for sore throat and tinnitus.   Eyes: Negative.   Respiratory: Negative for cough, shortness of breath and wheezing.   Cardiovascular: Negative for chest pain, palpitations and leg swelling.  Gastrointestinal: Negative for abdominal pain, blood in stool, constipation, diarrhea, heartburn and melena.  Genitourinary: Negative.   Skin: Negative.   Neurological: Negative for dizziness, sensory change, loss of  consciousness and headaches.  Psychiatric/Behavioral: Negative for depression. The patient is not nervous/anxious and does not have insomnia.     Physical Exam: Estimated body mass index is 29.19 kg/m as calculated from the following:   Height as of this encounter: 5\' 8"  (1.727 m).   Weight as of this encounter: 192 lb (87.1 kg). BP (!) 140/96   Pulse 66   Temp 98.2 F (36.8 C) (Temporal)   Resp 16   Ht 5\' 8"  (1.727 m)   Wt 192 lb (87.1 kg)   LMP  (LMP Unknown)   BMI 29.19 kg/m   General Appearance: Well nourished well developed, in no apparent distress.  Eyes: PERRLA, EOMs, conjunctiva no swelling or erythema ENT/Mouth: Left Ear canals normal without obstruction,Right ear with some canal redness and swelling.  No left ear swelling, erythema, or discharge.  TMs normal bilaterally with no erythema, bulging, retraction, or loss of landmark.  Oropharynx moist and clear with no exudate, erythema, or swelling.   Neck: Supple, thyroid normal. No bruits.  No cervical  adenopathy Respiratory: Respiratory effort normal, Breath sounds clear A&P without wheeze, rhonchi, rales.   Cardio: RRR without murmurs, rubs or gallops. Brisk peripheral pulses without edema.  Chest: symmetric, with normal excursions Breasts: Symmetric, with firm freely mobile <1 cm nodule to the left breast at 6 oclock approximately 1 in from aereola, nipple discharge, retractions.  Abdomen: Soft, nontender, no guarding, rebound, hernias, masses, or organomegaly.  Lymphatics: Non tender without lymphadenopathy.  Genitourinary: Patient declined pap smear and pelvic exam Musculoskeletal: Full ROM all peripheral extremities,5/5 strength, and normal gait.  Skin: Warm, dry without rashes, lesions, ecchymosis. Neuro: Awake and oriented X 3, Cranial nerves intact, reflexes equal bilaterally. Normal muscle tone, no cerebellar symptoms. Sensation intact.  Psych:  normal affect, Insight and Judgment appropriate.   EKG: WNL no  changes.  Over 40 minutes of exam, counseling, chart review and critical decision making was performed  Toni Amendourtney Forcucci 10:52 AM [facility]

## 2016-09-08 ENCOUNTER — Other Ambulatory Visit: Payer: Self-pay | Admitting: Internal Medicine

## 2016-09-08 DIAGNOSIS — N6320 Unspecified lump in the left breast, unspecified quadrant: Secondary | ICD-10-CM

## 2016-09-08 LAB — URINALYSIS, ROUTINE W REFLEX MICROSCOPIC
Bilirubin Urine: NEGATIVE
Glucose, UA: NEGATIVE
Hgb urine dipstick: NEGATIVE
Ketones, ur: NEGATIVE
LEUKOCYTES UA: NEGATIVE
NITRITE: NEGATIVE
PROTEIN: NEGATIVE
SPECIFIC GRAVITY, URINE: 1.018 (ref 1.001–1.035)
pH: 6 (ref 5.0–8.0)

## 2016-09-08 LAB — MICROALBUMIN / CREATININE URINE RATIO
CREATININE, URINE: 206 mg/dL (ref 20–320)
MICROALB UR: 0.9 mg/dL
MICROALB/CREAT RATIO: 4 ug/mg{creat} (ref <30)

## 2016-09-08 LAB — INSULIN, RANDOM: Insulin: 8.8 u[IU]/mL (ref 2.0–19.6)

## 2016-09-08 LAB — VITAMIN D 25 HYDROXY (VIT D DEFICIENCY, FRACTURES): VIT D 25 HYDROXY: 24 ng/mL — AB (ref 30–100)

## 2016-10-08 ENCOUNTER — Ambulatory Visit (INDEPENDENT_AMBULATORY_CARE_PROVIDER_SITE_OTHER): Payer: Managed Care, Other (non HMO) | Admitting: Internal Medicine

## 2016-10-08 ENCOUNTER — Encounter: Payer: Self-pay | Admitting: Internal Medicine

## 2016-10-08 VITALS — BP 122/84 | HR 84 | Temp 97.3°F | Resp 16 | Ht 68.75 in | Wt 196.0 lb

## 2016-10-08 DIAGNOSIS — J0141 Acute recurrent pansinusitis: Secondary | ICD-10-CM | POA: Diagnosis not present

## 2016-10-08 MED ORDER — LEVOFLOXACIN 500 MG PO TABS: ORAL_TABLET | ORAL | 1 refills | Status: DC

## 2016-10-08 MED ORDER — PREDNISONE 10 MG PO TABS: ORAL_TABLET | ORAL | 0 refills | Status: DC

## 2016-10-08 NOTE — Progress Notes (Signed)
   Subjective:    Patient ID: Denise Jensen, female    DOB: 03-02-1956, 61 y.o.   MRN: [id number]  HPI  This very nice 61 yo MWF was treated about a month ago w/Augmentin/Prednisone for a sinusitis and initially improved and presents now c/o worsening recurrence of sx's over the lat 5-7 days. Patient is c/o frontal/maxillary pressure & pain. Denies fever, chills, visual c/o, S/T or lower respiratory sx's.   Medication Sig  . Azelastine 0.1 % nasal spray Place 2 sprays into both nostrils 2 (two) times daily.  . diclofenac  1 % GEL Apply 2 g topically 4 (four) times daily.  Marland Kitchen. esomeprazole 20 MG capsule Take 20 mg by mouth daily at 12 noon.  . fexofenadine  180 MG tablet Take 1 tablet (180 mg total) by mouth daily.  Marland Kitchen. levothyroxine  112 MCG tablet TAKE 1 TABLET BY MOUTH ONCE DAILY   Allergies  Allergen Reactions  . Codeine     REACTION: hallucinations  . Mometasone Furoate     REACTION: headache   Past Medical History:  Diagnosis Date  . GERD (gastroesophageal reflux disease)   . Unspecified hypothyroidism   . Unspecified vitamin D deficiency    Review of Systems  10 point systems review negative except as above.    Objective:   Physical Exam  BP 122/84   Pulse 84   Temp 97.3 F (36.3 C)   Resp 16   Ht 5' 8.75" (1.746 m)   Wt 196 lb (88.9 kg)   LMP  (LMP Unknown)   BMI 29.16 kg/m   HEENT - Eac's patent. TM's Nl. EOM's full. PERRLA.  (+) tender frontal /Maxillary sinus areas. NasoOroPharynx clear. Neck - supple. Nl Thyroid. Carotids 2+ & No bruits, nodes, JVD Chest - Clear. Cor - Nl HS. RRR w/o sig M.  MS- FROM w/o deformities.  Gait Nl. Neuro - No obvious Cr N abnormalities. Nl w/o focal abnormalities.    Assessment & Plan:   1. Acute recurrent pansinusitis  - levofloxacin (LEVAQUIN) 500 MG tablet; Take 1 tablet daily Disp: 15 tabl; Refill: 1 - predniSONE 10 MG tablet; 1 tab 3 x day for 3 days, then 1 tab 2 x day for 3 days, then 1 tab 1 x day for 5 days  Disp:  20 tab  - discussed meds/SE's and ROV prn

## 2016-10-08 NOTE — Patient Instructions (Signed)
Mucus Relief 1200 mg  2 x/day  +++++++++++++++++++++++ Sinusitis, Adult Sinusitis is soreness and inflammation of your sinuses. Sinuses are hollow spaces in the bones around your face. They are located:  Around your eyes.  In the middle of your forehead.  Behind your nose.  In your cheekbones. Your sinuses and nasal passages are lined with a stringy fluid (mucus). Mucus normally drains out of your sinuses. When your nasal tissues get inflamed or swollen, the mucus can get trapped or blocked so air cannot flow through your sinuses. This lets bacteria, viruses, and funguses grow, and that leads to infection. Follow these instructions at home: Medicines  Take, use, or apply over-the-counter and prescription medicines only as told by your doctor. These may include nasal sprays.  If you were prescribed an antibiotic medicine, take it as told by your doctor. Do not stop taking the antibiotic even if you start to feel better. Hydrate and Humidify  Drink enough water to keep your pee (urine) clear or pale yellow.  Use a cool mist humidifier to keep the humidity level in your home above 50%.  Breathe in steam for 10-15 minutes, 3-4 times a day or as told by your doctor. You can do this in the bathroom while a hot shower is running.  Try not to spend time in cool or dry air. Rest  Rest as much as possible.  Sleep with your head raised (elevated).  Make sure to get enough sleep each night. General instructions  Put a warm, moist washcloth on your face 3-4 times a day or as told by your doctor. This will help with discomfort.  Wash your hands often with soap and water. If there is no soap and water, use hand sanitizer.  Do not smoke. Avoid being around people who are smoking (secondhand smoke).  Keep all follow-up visits as told by your doctor. This is important. Contact a doctor if:  You have a fever.  Your symptoms get worse.  Your symptoms do not get better within 10  days. Get help right away if:  You have a very bad headache.  You cannot stop throwing up (vomiting).  You have pain or swelling around your face or eyes.  You have trouble seeing.  You feel confused.  Your neck is stiff.  You have trouble breathing. This information is not intended to replace advice given to you by your health care provider. Make sure you discuss any questions you have with your health care provider. Document Released: 03/09/2008 Document Revised: 05/17/2016 Document Reviewed: 07/17/2015 Elsevier Interactive Patient Education  2017 [employer]

## 2016-12-11 ENCOUNTER — Ambulatory Visit: Payer: Self-pay | Admitting: Internal Medicine

## 2017-01-07 ENCOUNTER — Other Ambulatory Visit: Payer: Self-pay | Admitting: Physician Assistant

## 2017-01-08 ENCOUNTER — Encounter: Payer: Self-pay | Admitting: Internal Medicine

## 2017-01-08 ENCOUNTER — Ambulatory Visit (INDEPENDENT_AMBULATORY_CARE_PROVIDER_SITE_OTHER): Payer: 59 | Admitting: Internal Medicine

## 2017-01-08 VITALS — BP 138/80 | HR 68 | Temp 98.2°F | Resp 16 | Ht 68.75 in | Wt 196.0 lb

## 2017-01-08 DIAGNOSIS — E039 Hypothyroidism, unspecified: Secondary | ICD-10-CM

## 2017-01-08 DIAGNOSIS — M797 Fibromyalgia: Secondary | ICD-10-CM

## 2017-01-08 DIAGNOSIS — E559 Vitamin D deficiency, unspecified: Secondary | ICD-10-CM | POA: Diagnosis not present

## 2017-01-08 DIAGNOSIS — Z79899 Other long term (current) drug therapy: Secondary | ICD-10-CM

## 2017-01-08 DIAGNOSIS — R7309 Other abnormal glucose: Secondary | ICD-10-CM | POA: Diagnosis not present

## 2017-01-08 DIAGNOSIS — E782 Mixed hyperlipidemia: Secondary | ICD-10-CM | POA: Diagnosis not present

## 2017-01-08 LAB — CBC WITH DIFFERENTIAL/PLATELET
BASOS ABS: 58 {cells}/uL (ref 0–200)
Basophils Relative: 1 %
EOS ABS: 116 {cells}/uL (ref 15–500)
Eosinophils Relative: 2 %
HEMATOCRIT: 41.6 % (ref 35.0–45.0)
HEMOGLOBIN: 13.7 g/dL (ref 11.7–15.5)
LYMPHS ABS: 1914 {cells}/uL (ref 850–3900)
Lymphocytes Relative: 33 %
MCH: 29.3 pg (ref 27.0–33.0)
MCHC: 32.9 g/dL (ref 32.0–36.0)
MCV: 89.1 fL (ref 80.0–100.0)
MONO ABS: 464 {cells}/uL (ref 200–950)
MPV: 8.8 fL (ref 7.5–12.5)
Monocytes Relative: 8 %
NEUTROS PCT: 56 %
Neutro Abs: 3248 cells/uL (ref 1500–7800)
Platelets: 315 10*3/uL (ref 140–400)
RBC: 4.67 MIL/uL (ref 3.80–5.10)
RDW: 14.3 % (ref 11.0–15.0)
WBC: 5.8 10*3/uL (ref 3.8–10.8)

## 2017-01-08 LAB — BASIC METABOLIC PANEL WITH GFR
BUN: 15 mg/dL (ref 7–25)
CALCIUM: 9.3 mg/dL (ref 8.6–10.4)
CHLORIDE: 104 mmol/L (ref 98–110)
CO2: 29 mmol/L (ref 20–31)
CREATININE: 0.78 mg/dL (ref 0.50–0.99)
GFR, Est African American: >89 mL/min (ref >=60)
GFR, Est Non African American: 82 mL/min (ref >=60)
GLUCOSE: 84 mg/dL (ref 65–99)
Potassium: 4.5 mmol/L (ref 3.5–5.3)
Sodium: 140 mmol/L (ref 135–146)

## 2017-01-08 LAB — LIPID PANEL
CHOLESTEROL: 247 mg/dL — AB (ref <200)
HDL: 53 mg/dL (ref >50)
LDL CALC: 131 mg/dL — AB (ref <100)
TRIGLYCERIDES: 314 mg/dL — AB (ref <150)
Total CHOL/HDL Ratio: 4.7 Ratio (ref <5.0)
VLDL: 63 mg/dL — ABNORMAL HIGH (ref <30)

## 2017-01-08 LAB — HEPATIC FUNCTION PANEL
ALT: 13 U/L (ref 6–29)
AST: 18 U/L (ref 10–35)
Albumin: 4.1 g/dL (ref 3.6–5.1)
Alkaline Phosphatase: 99 U/L (ref 33–130)
BILIRUBIN INDIRECT: 0.4 mg/dL (ref 0.2–1.2)
Bilirubin, Direct: 0.1 mg/dL (ref <=0.2)
Total Bilirubin: 0.5 mg/dL (ref 0.2–1.2)
Total Protein: 7.3 g/dL (ref 6.1–8.1)

## 2017-01-08 LAB — TSH: TSH: 1.96 mIU/L

## 2017-01-08 NOTE — Progress Notes (Signed)
Assessment and Plan:  Hypertension:  -not on medication -recheck normal -monitor blood pressure at home.  -Continue DASH diet.   -Reminder to go to the ER if any CP, SOB, nausea, dizziness, severe HA, changes vision/speech, left arm numbness and tingling, and jaw pain.  Cholesterol: -patient refuses medications -she is working on diet -Continue diet and exercise.  -Check cholesterol.   Pre-diabetes: -Continue diet and exercise.   Vitamin D Def: -continue medications.   Hypothyroidism -cont levothryoxine -TSH -dose adjust if necessary  Chronic sinusitis -recommended nasal saline rinses q6 hours -try claritin or switch back to [employer] and meds as discussed. Further disposition pending results of labs.  HPI 61 y.o. female  presents for 3 month follow up with hypertension, hyperlipidemia, prediabetes and vitamin D.   Her blood pressure has been controlled at home, today their BP is BP: (!) 154/90.   She does workout. She denies chest pain, shortness of breath, dizziness.  BP has been doing okay at home.  She is under a lot of stress right now as she has 5 friends that are currently suffering from cancer which she is trying to take care of.     She is not on cholesterol medication and denies myalgias. Her cholesterol is not at goal. The cholesterol last visit was:   Lab Results  Component Value Date   CHOL 254 (H) 09/07/2016   HDL 57 09/07/2016   LDLCALC 135 (H) 09/07/2016   LDLDIRECT 75.3 09/11/2010   TRIG 308 (H) 09/07/2016   CHOLHDL 4.5 09/07/2016  She refuses to take cholesterol medications.     She has been working on diet and exercise for prediabetes, and denies foot ulcerations, hyperglycemia, hypoglycemia , increased appetite, nausea, paresthesia of the feet, polydipsia, polyuria, visual disturbances, vomiting and weight loss. Last A1C in the office was:  Lab Results  Component Value Date   HGBA1C 5.4 09/07/2016    Patient is on Vitamin D  supplement.  Lab Results  Component Value Date   VD25OH 24 (L) 09/07/2016      She is not having reflux currently.  She is taking her generic nexium with good relief.  She is taking her levothyroxine daily on an empty stomach. She wakes up and takes the medication and goes back to sleep for an hour.    She is still having some sinus congestion.  She reports that she switched to zyrtec but this makes her keyed up.  She has taken allegra and claritin in the past without issue.    Current Medications:  Current Outpatient Prescriptions on File Prior to Visit  Medication Sig Dispense Refill  . esomeprazole (NEXIUM) 20 MG capsule Take 20 mg by mouth daily at 12 noon.    Marland Kitchen levothyroxine (SYNTHROID, LEVOTHROID) 112 MCG tablet TAKE 1 TABLET BY MOUTH ONCE DAILY ON AN EMPTY STOMACH 30 TO 60 MINUTES BEFORE A MEAL. 90 tablet 3   No current facility-administered medications on file prior to visit.     Medical History:  Past Medical History:  Diagnosis Date  . GERD (gastroesophageal reflux disease)   . Unspecified hypothyroidism   . Unspecified vitamin D deficiency     Allergies:  Allergies  Allergen Reactions  . Codeine     REACTION: hallucinations  . Mometasone Furoate     REACTION: headache     Review of Systems:  Review of Systems  Constitutional: Negative for chills, fever and malaise/fatigue.  HENT: Positive for congestion and sinus pain. Negative for  ear pain, hearing loss and sore throat.   Eyes: Negative.   Respiratory: Negative for cough, shortness of breath and wheezing.   Cardiovascular: Negative for chest pain, palpitations and leg swelling.  Gastrointestinal: Negative.   Genitourinary: Negative for dysuria, flank pain, frequency, hematuria and urgency.  Musculoskeletal: Negative.   Skin: Negative.   Neurological: Negative for dizziness, sensory change and loss of consciousness.  Psychiatric/Behavioral: Positive for depression. The patient is not nervous/anxious and  does not have insomnia.     Family history- Review and unchanged  Social history- Review and unchanged  Physical Exam: BP (!) 154/90   Pulse 68   Temp 98.2 F (36.8 C) (Temporal)   Resp 16   Ht 5' 8.75" (1.746 m)   Wt 196 lb (88.9 kg)   LMP  (LMP Unknown)   BMI 29.16 kg/m  Wt Readings from Last 3 Encounters:  01/08/17 196 lb (88.9 kg)  10/08/16 196 lb (88.9 kg)  09/07/16 192 lb (87.1 kg)    General Appearance: Well nourished well developed, in no apparent distress. Eyes: PERRLA, EOMs, conjunctiva no swelling or erythema ENT/Mouth: Ear canals normal without obstruction, swelling, erythma, discharge.  TMs normal bilaterally.  Oropharynx moist, clear, without exudate, or postoropharyngeal swelling. Neck: Supple, thyroid normal,no cervical adenopathy  Respiratory: Respiratory effort normal, Breath sounds clear A&P without rhonchi, wheeze, or rale.  No retractions, no accessory usage. Cardio: RRR with no MRGs. Brisk peripheral pulses without edema.  Abdomen: Soft, + BS,  Non tender, no guarding, rebound, hernias, masses. Musculoskeletal: Full ROM, 5/5 strength, Normal gait Skin: Warm, dry without rashes, lesions, ecchymosis.  Neuro: Awake and oriented X 3, Cranial nerves intact. Normal muscle tone, no cerebellar symptoms. Psych: Normal affect, Insight and Judgment appropriate.    Terri Piedra, PA-C 9:12 AM [facility]

## 2017-01-08 NOTE — Patient Instructions (Signed)
[phone] [facility].

## 2017-01-14 ENCOUNTER — Other Ambulatory Visit: Payer: Self-pay | Admitting: *Deleted

## 2017-01-14 MED ORDER — EZETIMIBE 10 MG PO TABS: 10.0000 mg | ORAL_TABLET | Freq: Every day | ORAL | 3 refills | Status: DC

## 2017-03-16 ENCOUNTER — Encounter: Payer: Self-pay | Admitting: Internal Medicine

## 2017-04-05 ENCOUNTER — Other Ambulatory Visit: Payer: Self-pay | Admitting: Physician Assistant

## 2017-04-05 ENCOUNTER — Other Ambulatory Visit: Payer: Self-pay | Admitting: Internal Medicine

## 2017-04-05 DIAGNOSIS — N6320 Unspecified lump in the left breast, unspecified quadrant: Secondary | ICD-10-CM

## 2017-04-05 DIAGNOSIS — R5381 Other malaise: Secondary | ICD-10-CM

## 2017-04-16 ENCOUNTER — Ambulatory Visit
Admission: RE | Admit: 2017-04-16 | Discharge: 2017-04-16 | Disposition: A | Discharge status: Home / Self Care | Payer: Managed Care, Other (non HMO) | Source: Ambulatory Visit | Attending: Physician Assistant | Admitting: Physician Assistant

## 2017-04-16 DIAGNOSIS — N6320 Unspecified lump in the left breast, unspecified quadrant: Secondary | ICD-10-CM

## 2017-04-18 NOTE — Progress Notes (Signed)
Assessment and Plan:    Hypothyroidism, unspecified type Hypothyroidism-check TSH level, continue medications the same, reminded to take on an empty stomach 30-21mins before food.  -     TSH  ELEVATED BLOOD PRESSURE WITHOUT DIAGNOSIS OF HYPERTENSION - continue medications, DASH diet, exercise and monitor at home. Call if greater than 130/80.  -     CBC with Differential/Platelet -     BASIC METABOLIC PANEL WITH GFR -     Hepatic function panel  Mixed hyperlipidemia -continue medications, check lipids, decrease fatty foods, increase activity.  -     Lipid panel  Medication management -     Magnesium  Anemia, unspecified type -     Iron and TIBC -     Vitamin B12  Acute maxillary sinusitis, recurrence not specified -     predniSONE (DELTASONE) 20 MG tablet; 2 tablets daily for 3 days, 1 tablet daily for 4 days. -     levofloxacin (LEVAQUIN) 500 MG tablet; Take 1 tablet (500 mg total) by mouth daily.  Cerumen impaction - stop using Qtips, irrigation used in the office without complications, use OTC drops/oil at home to prevent reoccurence   Continue diet and meds as discussed. Further disposition pending results of labs. Future Appointments Date Time Provider Department Center  09/16/2017 3:00 PM Quentin Mulling, PA-C [facility] None    HPI 61 y.o. female  presents for 3 month follow up with hypertension, hyperlipidemia, prediabetes and vitamin D. She states her sinuses are still bothering her, has had several weeks of sinus issues, decreased hearing left ear, has history of vertigo. She is back on claritin.  Her blood pressure has been controlled at home, today their BP is BP: (!) 144/86 She does workout. She denies chest pain, shortness of breath, dizziness.  She is on cholesterol medication and denies myalgias. Her cholesterol is not at goal. The cholesterol last visit was:   Lab Results  Component Value Date   CHOL 247 (H) 01/08/2017   HDL 53 01/08/2017   LDLCALC 131  (H) 01/08/2017   LDLDIRECT 75.3 09/11/2010   TRIG 314 (H) 01/08/2017   CHOLHDL 4.7 01/08/2017   Lab Results  Component Value Date   HGBA1C 5.4 09/07/2016   She has been working on diet and exercise for prediabetes, and denies paresthesia of the feet, polydipsia and polyuria. Last A1C in the office was: 5.6 Patient is not on Vitamin D supplement.   Lab Results  Component Value Date   VD25OH 24 (L) 09/07/2016       She is on thyroid medication. Her medication was changed last visit but she states she did not change it, she is on 1 pill M,W,F and 1.5 pills the other days of the week. Patient denies palpitations, she has had constipation, fatigue.  Lab Results  Component Value Date   TSH 1.96 01/08/2017  .  BMI is Body mass index is 28.47 kg/m., she is working on diet and exercise. Wt Readings from Last 3 Encounters:  04/19/17 191 lb 6.4 oz (86.8 kg)  01/08/17 196 lb (88.9 kg)  10/08/16 196 lb (88.9 kg)    Current Medications:  Current Outpatient Prescriptions on File Prior to Visit  Medication Sig Dispense Refill  . esomeprazole (NEXIUM) 20 MG capsule Take 20 mg by mouth daily at 12 noon.    . ezetimibe (ZETIA) 10 MG tablet Take 1 tablet (10 mg total) by mouth daily. 30 tablet 3  . levothyroxine (SYNTHROID, LEVOTHROID) 112 MCG tablet TAKE  1 TABLET BY MOUTH ONCE DAILY ON AN EMPTY STOMACH 30 TO 60 MINUTES BEFORE A MEAL. 90 tablet 3  . vitamin E (VITAMIN E) 400 UNIT capsule Take 400 Units by mouth daily.     No current facility-administered medications on file prior to visit.    Medical History:  Past Medical History:  Diagnosis Date  . GERD (gastroesophageal reflux disease)   . Unspecified hypothyroidism   . Unspecified vitamin D deficiency    Allergies:  Allergies  Allergen Reactions  . Codeine     REACTION: hallucinations  . Mometasone Furoate     REACTION: headache     Review of Systems  Constitutional: Positive for malaise/fatigue. Negative for chills,  diaphoresis and fever.  HENT: Positive for congestion, ear pain, hearing loss and sinus pain. Negative for ear discharge, nosebleeds, sore throat and tinnitus.   Eyes: Negative.   Respiratory: Positive for cough and sputum production. Negative for shortness of breath and stridor.   Cardiovascular: Negative.   Gastrointestinal: Negative.   Genitourinary: Negative.     Family history- Review and unchanged Social history- Review and unchanged Physical Exam: BP (!) 144/86   Pulse 79   Temp (!) 96.9 F (36.1 C)   Resp 16   Ht 5' 8.75" (1.746 m)   Wt 191 lb 6.4 oz (86.8 kg)   LMP  (LMP Unknown)   BMI 28.47 kg/m  Wt Readings from Last 3 Encounters:  04/19/17 191 lb 6.4 oz (86.8 kg)  01/08/17 196 lb (88.9 kg)  10/08/16 196 lb (88.9 kg)   General Appearance: Well nourished, in no apparent distress. Eyes: PERRLA, EOMs, conjunctiva no swelling or erythema Sinuses: + maxillary tenderness ENT/Mouth: after manual extraction in the office of bilateral cerumen impaction TMs without erythema, bulging. No erythema, swelling, or exudate on post pharynx.  Tonsils not swollen or erythematous. Hearing normal.  Neck: Supple, thyroid normal.  Respiratory: Respiratory effort normal, BS equal bilaterally without rales, rhonchi, wheezing or stridor.  Cardio: RRR with no MRGs. Brisk peripheral pulses without edema.  Abdomen: Soft, + BS.  Non tender, no guarding, rebound, hernias, masses. Lymphatics: Non tender without lymphadenopathy.  Musculoskeletal: Full ROM, 5/5 strength, normal gait.  Skin: Warm, dry without rashes, lesions, ecchymosis.  Neuro: Cranial nerves intact. Normal muscle tone, no cerebellar symptoms. Sensation intact.  Psych: Awake and oriented X 3, normal affect, Insight and Judgment appropriate.    Quentin MullingAmanda Isiaah Cuervo 12:15 PM

## 2017-04-19 ENCOUNTER — Encounter: Payer: Self-pay | Admitting: Physician Assistant

## 2017-04-19 ENCOUNTER — Ambulatory Visit (INDEPENDENT_AMBULATORY_CARE_PROVIDER_SITE_OTHER): Payer: 59 | Admitting: Physician Assistant

## 2017-04-19 VITALS — BP 144/86 | HR 79 | Temp 96.9°F | Resp 16 | Ht 68.75 in | Wt 191.4 lb

## 2017-04-19 DIAGNOSIS — H6123 Impacted cerumen, bilateral: Secondary | ICD-10-CM | POA: Diagnosis not present

## 2017-04-19 DIAGNOSIS — R030 Elevated blood-pressure reading, without diagnosis of hypertension: Secondary | ICD-10-CM

## 2017-04-19 DIAGNOSIS — D649 Anemia, unspecified: Secondary | ICD-10-CM

## 2017-04-19 DIAGNOSIS — E782 Mixed hyperlipidemia: Secondary | ICD-10-CM

## 2017-04-19 DIAGNOSIS — E039 Hypothyroidism, unspecified: Secondary | ICD-10-CM | POA: Diagnosis not present

## 2017-04-19 DIAGNOSIS — Z79899 Other long term (current) drug therapy: Secondary | ICD-10-CM

## 2017-04-19 DIAGNOSIS — H9203 Otalgia, bilateral: Secondary | ICD-10-CM | POA: Diagnosis not present

## 2017-04-19 DIAGNOSIS — J0100 Acute maxillary sinusitis, unspecified: Secondary | ICD-10-CM

## 2017-04-19 LAB — CBC WITH DIFFERENTIAL/PLATELET
BASOS ABS: 68 {cells}/uL (ref 0–200)
Basophils Relative: 1 %
EOS ABS: 68 {cells}/uL (ref 15–500)
Eosinophils Relative: 1 %
HCT: 43.1 % (ref 35.0–45.0)
HEMOGLOBIN: 14.5 g/dL (ref 11.7–15.5)
LYMPHS ABS: 2108 {cells}/uL (ref 850–3900)
Lymphocytes Relative: 31 %
MCH: 29.8 pg (ref 27.0–33.0)
MCHC: 33.6 g/dL (ref 32.0–36.0)
MCV: 88.7 fL (ref 80.0–100.0)
MPV: 8.9 fL (ref 7.5–12.5)
Monocytes Absolute: 612 cells/uL (ref 200–950)
Monocytes Relative: 9 %
NEUTROS ABS: 3944 {cells}/uL (ref 1500–7800)
Neutrophils Relative %: 58 %
Platelets: 289 10*3/uL (ref 140–400)
RBC: 4.86 MIL/uL (ref 3.80–5.10)
RDW: 14.6 % (ref 11.0–15.0)
WBC: 6.8 10*3/uL (ref 3.8–10.8)

## 2017-04-19 LAB — HEPATIC FUNCTION PANEL
ALK PHOS: 108 U/L (ref 33–130)
ALT: 14 U/L (ref 6–29)
AST: 18 U/L (ref 10–35)
Albumin: 4.3 g/dL (ref 3.6–5.1)
BILIRUBIN DIRECT: 0.1 mg/dL (ref <=0.2)
Indirect Bilirubin: 0.4 mg/dL (ref 0.2–1.2)
Total Bilirubin: 0.5 mg/dL (ref 0.2–1.2)
Total Protein: 7.4 g/dL (ref 6.1–8.1)

## 2017-04-19 LAB — LIPID PANEL
CHOL/HDL RATIO: 4.9 ratio (ref <5.0)
Cholesterol: 267 mg/dL — ABNORMAL HIGH (ref <200)
HDL: 54 mg/dL (ref >50)
LDL Cholesterol: 150 mg/dL — ABNORMAL HIGH (ref <100)
Triglycerides: 316 mg/dL — ABNORMAL HIGH (ref <150)
VLDL: 63 mg/dL — ABNORMAL HIGH (ref <30)

## 2017-04-19 LAB — BASIC METABOLIC PANEL WITH GFR
BUN: 11 mg/dL (ref 7–25)
CO2: 27 mmol/L (ref 20–31)
CREATININE: 0.87 mg/dL (ref 0.50–0.99)
Calcium: 9.7 mg/dL (ref 8.6–10.4)
Chloride: 103 mmol/L (ref 98–110)
GFR, Est African American: 83 mL/min (ref >=60)
GFR, Est Non African American: 72 mL/min (ref >=60)
Glucose, Bld: 82 mg/dL (ref 65–99)
Potassium: 4.6 mmol/L (ref 3.5–5.3)
SODIUM: 139 mmol/L (ref 135–146)

## 2017-04-19 LAB — TSH: TSH: 1.01 m[IU]/L

## 2017-04-19 LAB — IRON AND TIBC
%SAT: 23 % (ref 11–50)
IRON: 84 ug/dL (ref 45–160)
TIBC: 359 ug/dL (ref 250–450)
UIBC: 275 ug/dL

## 2017-04-19 MED ORDER — LEVOFLOXACIN 500 MG PO TABS: 500.0000 mg | ORAL_TABLET | Freq: Every day | ORAL | 0 refills | Status: DC

## 2017-04-19 MED ORDER — PREDNISONE 20 MG PO TABS: ORAL_TABLET | ORAL | 0 refills | Status: DC

## 2017-04-19 NOTE — Patient Instructions (Addendum)
Monitor your blood pressure at home. Go to the ER if any CP, SOB, nausea, dizziness, severe HA, changes vision/speech  Omron wrist BP cuff from [employer]  Goal BP:  For patients younger than 60: Goal BP < 140/90. For patients 60 and older: Goal BP < 150/90. For patients with diabetes: Goal BP < 140/90. Your most recent BP: BP: (!) 144/86   Take your medications faithfully as instructed. Maintain a healthy weight. Get at least 150 minutes of aerobic exercise per week. Minimize salt intake. Minimize alcohol intake  DASH Eating Plan DASH stands for "Dietary Approaches to Stop Hypertension." The DASH eating plan is a healthy eating plan that has been shown to reduce high blood pressure (hypertension). Additional health benefits may include reducing the risk of type 2 diabetes mellitus, heart disease, and stroke. The DASH eating plan may also help with weight loss. WHAT DO I NEED TO KNOW ABOUT THE DASH EATING PLAN? For the DASH eating plan, you will follow these general guidelines:  Choose foods with a percent daily value for sodium of less than 5% (as listed on the food label).  Use salt-free seasonings or herbs instead of table salt or sea salt.  Check with your health care provider or pharmacist before using salt substitutes.  Eat lower-sodium products, often labeled as "lower sodium" or "no salt added."  Eat fresh foods.  Eat more vegetables, fruits, and low-fat dairy products.  Choose whole grains. Look for the word "whole" as the first word in the ingredient list.  Choose fish and skinless chicken or [place] more often than red meat. Limit fish, poultry, and meat to 6 oz (170 g) each day.  Limit sweets, desserts, sugars, and sugary drinks.  Choose heart-healthy fats.  Limit cheese to 1 oz (28 g) per day.  Eat more home-cooked food and less restaurant, buffet, and fast food.  Limit fried foods.  Cook foods using methods other than frying.  Limit canned vegetables. If you  do use them, rinse them well to decrease the sodium.  When eating at a restaurant, ask that your food be prepared with less salt, or no salt if possible. WHAT FOODS CAN I EAT? Seek help from a dietitian for individual calorie needs. Grains Whole grain or whole wheat bread. Brown rice. Whole grain or whole wheat pasta. Quinoa, bulgur, and whole grain cereals. Low-sodium cereals. Corn or whole wheat flour tortillas. Whole grain cornbread. Whole grain crackers. Low-sodium crackers. Vegetables Fresh or frozen vegetables (raw, steamed, roasted, or grilled). Low-sodium or reduced-sodium tomato and vegetable juices. Low-sodium or reduced-sodium tomato sauce and paste. Low-sodium or reduced-sodium canned vegetables.  Fruits All fresh, canned (in natural juice), or frozen fruits. Meat and Other Protein Products Ground beef (85% or leaner), grass-fed beef, or beef trimmed of fat. Skinless chicken or [place]key. Ground chicken or [place]key. Pork trimmed of fat. All fish and seafood. Eggs. Dried beans, peas, or lentils. Unsalted nuts and seeds. Unsalted canned beans. Dairy Low-fat dairy products, such as skim or 1% milk, 2% or reduced-fat cheeses, low-fat ricotta or cottage cheese, or plain low-fat yogurt. Low-sodium or reduced-sodium cheeses. Fats and Oils Tub margarines without trans fats. Light or reduced-fat mayonnaise and salad dressings (reduced sodium). Avocado. Safflower, olive, or canola oils. Natural peanut or almond butter. Other Unsalted popcorn and pretzels. The items listed above may not be a complete list of recommended foods or beverages. Contact your dietitian for more options. WHAT FOODS ARE NOT RECOMMENDED? Grains White bread. White pasta. White rice. Refined  cornbread. Bagels and croissants. Crackers that contain trans fat. Vegetables Creamed or fried vegetables. Vegetables in a cheese sauce. Regular canned vegetables. Regular canned tomato sauce and paste. Regular tomato and vegetable  juices. Fruits Dried fruits. Canned fruit in light or heavy syrup. Fruit juice. Meat and Other Protein Products Fatty cuts of meat. Ribs, chicken wings, bacon, sausage, bologna, salami, chitterlings, fatback, hot dogs, bratwurst, and packaged luncheon meats. Salted nuts and seeds. Canned beans with salt. Dairy Whole or 2% milk, cream, half-and-half, and cream cheese. Whole-fat or sweetened yogurt. Full-fat cheeses or blue cheese. Nondairy creamers and whipped toppings. Processed cheese, cheese spreads, or cheese curds. Condiments Onion and garlic salt, seasoned salt, table salt, and sea salt. Canned and packaged gravies. Worcestershire sauce. Tartar sauce. Barbecue sauce. Teriyaki sauce. Soy sauce, including reduced sodium. Steak sauce. Fish sauce. Oyster sauce. Cocktail sauce. Horseradish. Ketchup and mustard. Meat flavorings and tenderizers. Bouillon cubes. Hot sauce. Tabasco sauce. Marinades. Taco seasonings. Relishes. Fats and Oils Butter, stick margarine, lard, shortening, ghee, and bacon fat. Coconut, palm kernel, or palm oils. Regular salad dressings. Other Pickles and olives. Salted popcorn and pretzels. The items listed above may not be a complete list of foods and beverages to avoid. Contact your dietitian for more information. WHERE CAN I FIND MORE INFORMATION? National Heart, Lung, and Blood Institute: [employer] Document Released: 09/10/2011 Document Revised: 02/05/2014 Document Reviewed: 07/26/2013 [facility] Patient Information 2015 [place], [place]. This information is not intended to replace advice given to you by your health care provider. Make sure you discuss any questions you have with your health care provider.   Benefiber is good for constipation/diarrhea/irritable bowel syndrome, it helps with weight loss and can help lower your bad cholesterol. Please do 1 TBSP in the morning in water, coffee, or tea. It can take up to a month before  you can see a difference with your bowel movements. It is cheapest from [employer], [employer], [employer].    Vitamin D goal is between 60-80  Please make sure that you are taking your Vitamin D as directed.   It is very important as a natural anti-inflammatory   helping hair, skin, and nails, as well as reducing stroke and heart attack risk.   It helps your bones and helps with mood.  We want you on at least 5000 IU daily  It also decreases numerous cancer risks so please take it as directed.   Low Vit D is associated with a 200-300% higher risk for CANCER   and 200-300% higher risk for HEART   ATTACK  &  STROKE.    .....................................Marland Kitchen  It is also associated with higher death rate at younger ages,   autoimmune diseases like Rheumatoid arthritis, Lupus, Multiple Sclerosis.     Also many other serious conditions, like depression, Alzheimer's  Dementia, infertility, muscle aches, fatigue, fibromyalgia - just to name a few.  +++++++++++++++++++  Can get liquid vitamin D from [place]  OR here in [place] at  [facility] alternatives [address], [place], [place] [address] Or you can try earth fare

## 2017-04-20 LAB — MAGNESIUM: Magnesium: 2.2 mg/dL (ref 1.5–2.5)

## 2017-04-20 LAB — VITAMIN B12: Vitamin B-12: 285 pg/mL (ref 200–1100)

## 2017-04-22 NOTE — Progress Notes (Signed)
LVM for pt to return office call for LAB results.

## 2017-04-27 NOTE — Progress Notes (Signed)
Pt aware of lab results & voiced understanding of those results.

## 2017-06-02 ENCOUNTER — Ambulatory Visit (INDEPENDENT_AMBULATORY_CARE_PROVIDER_SITE_OTHER): Payer: 59 | Admitting: Physician Assistant

## 2017-06-02 ENCOUNTER — Encounter: Payer: Self-pay | Admitting: Physician Assistant

## 2017-06-02 VITALS — BP 132/76 | HR 67 | Temp 97.9°F | Resp 14 | Ht 68.75 in | Wt 190.6 lb

## 2017-06-02 DIAGNOSIS — R05 Cough: Secondary | ICD-10-CM | POA: Diagnosis not present

## 2017-06-02 DIAGNOSIS — R059 Cough, unspecified: Secondary | ICD-10-CM

## 2017-06-02 MED ORDER — IPRATROPIUM-ALBUTEROL 0.5-2.5 (3) MG/3ML IN SOLN
3.0000 mL | Freq: Once | RESPIRATORY_TRACT | Status: AC
  Administered 2017-06-02: 3 mL via RESPIRATORY_TRACT

## 2017-06-02 MED ORDER — PREDNISONE 20 MG PO TABS: ORAL_TABLET | ORAL | 0 refills | Status: DC

## 2017-06-02 MED ORDER — BENZONATATE 100 MG PO CAPS: 100.0000 mg | ORAL_CAPSULE | Freq: Three times a day (TID) | ORAL | 0 refills | Status: DC | PRN

## 2017-06-02 MED ORDER — AZITHROMYCIN 250 MG PO TABS: ORAL_TABLET | ORAL | 1 refills | Status: AC

## 2017-06-02 MED ORDER — ALBUTEROL SULFATE HFA 108 (90 BASE) MCG/ACT IN AERS: 2.0000 | INHALATION_SPRAY | RESPIRATORY_TRACT | 0 refills | Status: DC | PRN

## 2017-06-02 NOTE — Progress Notes (Signed)
   Subjective:    Patient ID: Denise Jensen, female    DOB: 1955/12/12, 61 y.o.   MRN: [id number]  HPI 61 y.o. WF presents with cough. She was given prednisone and levaquin 07/16. She has clear mucus she is coughing up, deep cough. Last CXR 2011. No CP, SOB. Getting ready to go on vacation.   Blood pressure 132/76, pulse 67, temperature 97.9 F (36.6 C), resp. rate 14, height 5' 8.75" (1.746 m), weight 190 lb 9.6 oz (86.5 kg), SpO2 97 %.  Medications Current Outpatient Prescriptions on File Prior to Visit  Medication Sig  . esomeprazole (NEXIUM) 20 MG capsule Take 20 mg by mouth daily at 12 noon.  . ezetimibe (ZETIA) 10 MG tablet Take 1 tablet (10 mg total) by mouth daily.  Marland Kitchen. levothyroxine (SYNTHROID, LEVOTHROID) 112 MCG tablet TAKE 1 TABLET BY MOUTH ONCE DAILY ON AN EMPTY STOMACH 30 TO 60 MINUTES BEFORE A MEAL.  . vitamin E (VITAMIN E) 400 UNIT capsule Take 400 Units by mouth daily.   No current facility-administered medications on file prior to visit.     Problem list She has Hypothyroidism; ALLERGIC RHINITIS, SEASONAL; DEGENERATION, LUMBAR/LUMBOSACRAL DISC; Fibromyalgia; Palpitations; ELEVATED BLOOD PRESSURE WITHOUT DIAGNOSIS OF HYPERTENSION; Vitamin D deficiency; Mixed hyperlipidemia; Abnormal glucose; and Medication management on her problem list.   Review of Systems  Constitutional: Positive for fatigue. Negative for chills and fever.  HENT: Positive for congestion, rhinorrhea, sinus pressure and sore throat. Negative for dental problem, ear discharge, ear pain, nosebleeds, trouble swallowing and voice change.   Respiratory: Positive for cough, chest tightness, shortness of breath and wheezing.   Cardiovascular: Negative.   Gastrointestinal: Negative.   Genitourinary: Negative.   Musculoskeletal: Negative.   Neurological: Negative.        Objective:   Physical Exam  Constitutional: She is oriented to person, place, and time. She appears well-developed and  well-nourished.  HENT:  Head: Normocephalic and atraumatic.  Right Ear: External ear normal.  Left Ear: External ear normal.  Nose: Nose normal.  Mouth/Throat: Oropharynx is clear and moist.  Eyes: Pupils are equal, round, and reactive to light. Conjunctivae are normal.  Neck: Normal range of motion. Neck supple.  Cardiovascular: Normal rate and regular rhythm.   Pulmonary/Chest: Effort normal. No respiratory distress. She has wheezes. She has no rales. She exhibits no tenderness.  Abdominal: Soft. Bowel sounds are normal.  Lymphadenopathy:    She has no cervical adenopathy.  Neurological: She is alert and oriented to person, place, and time.  Skin: Skin is warm and dry.        Assessment & Plan:  Cough Sounds better after breathing treatment in the office, treat with zpak, breo samples and albuterol, will get CXR, go to ER if any SOB, CP - ipratropium-albuterol (DUONEB) 0.5-2.5 (3) MG/3ML nebulizer solution 3 mL; Take 3 mLs by nebulization once. - DG Chest 2 View; Future - predniSONE (DELTASONE) 20 MG tablet; 2 tablets daily for 3 days, 1 tablet daily for 4 days.  Dispense: 10 tablet; Refill: 0 - benzonatate (TESSALON PERLES) 100 MG capsule; Take 1 capsule (100 mg total) by mouth 3 (three) times daily as needed for cough (Max: 600mg  per day).  Dispense: 30 capsule; Refill: 0 - azithromycin (ZITHROMAX) 250 MG tablet; Take 2 tablets (500 mg) on  Day 1,  followed by 1 tablet (250 mg) once daily on Days 2 through 5.  Dispense: 6 each; Refill: 1

## 2017-06-02 NOTE — Patient Instructions (Signed)
Cough, Adult Coughing is a reflex that clears your throat and your airways. Coughing helps to heal and protect your lungs. It is normal to cough occasionally, but a cough that happens with other symptoms or lasts a long time may be a sign of a condition that needs treatment. A cough may last only 2-3 weeks (acute), or it may last longer than 8 weeks (chronic). What are the causes? Coughing is commonly caused by:  Breathing in substances that irritate your lungs.  A viral or bacterial respiratory infection.  Allergies.  Asthma.  Postnasal drip.  Smoking.  Acid backing up from the stomach into the esophagus (gastroesophageal reflux).  Certain medicines.  Chronic lung problems, including COPD (or rarely, lung cancer).  Other medical conditions such as heart failure.  Follow these instructions at home: Pay attention to any changes in your symptoms. Take these actions to help with your discomfort:  Take medicines only as told by your health care provider. ? If you were prescribed an antibiotic medicine, take it as told by your health care provider. Do not stop taking the antibiotic even if you start to feel better. ? Talk with your health care provider before you take a cough suppressant medicine.  Drink enough fluid to keep your urine clear or pale yellow.  If the air is dry, use a cold steam vaporizer or humidifier in your bedroom or your home to help loosen secretions.  Avoid anything that causes you to cough at work or at home.  If your cough is worse at night, try sleeping in a semi-upright position.  Avoid cigarette smoke. If you smoke, quit smoking. If you need help quitting, ask your health care provider.  Avoid caffeine.  Avoid alcohol.  Rest as needed.  Contact a health care provider if:  You have new symptoms.  You cough up pus.  Your cough does not get better after 2-3 weeks, or your cough gets worse.  You cannot control your cough with suppressant  medicines and you are losing sleep.  You develop pain that is getting worse or pain that is not controlled with pain medicines.  You have a fever.  You have unexplained weight loss.  You have night sweats. Get help right away if:  You cough up blood.  You have difficulty breathing.  Your heartbeat is very fast. This information is not intended to replace advice given to you by your health care provider. Make sure you discuss any questions you have with your health care provider. Document Released: 03/20/2011 Document Revised: 02/27/2016 Document Reviewed: 11/28/2014 Elsevier Interactive Patient Education  2017 Elsevier Inc.  

## 2017-08-20 ENCOUNTER — Ambulatory Visit: Payer: Self-pay | Admitting: Physician Assistant

## 2017-09-08 ENCOUNTER — Encounter: Payer: Self-pay | Admitting: Internal Medicine

## 2017-09-16 ENCOUNTER — Encounter: Payer: Self-pay | Admitting: Physician Assistant

## 2017-11-16 ENCOUNTER — Encounter: Payer: Self-pay | Admitting: Physician Assistant

## 2017-12-17 NOTE — Progress Notes (Signed)
Complete Physical  Assessment and Plan:  New LBBB History of smoking, chol, HTN with risk factors, new onset fatigue and LBBB will refer to cardiology.  Start bASA Check labs- will likely start statin No overt CP other than costochondritis and patient does have pain with palpation  Hypothyroidism, unspecified type Hypothyroidism-check TSH level, continue medications the same, reminded to take on an empty stomach 30-19mins before food.  -     TSH  ELEVATED BLOOD PRESSURE WITHOUT DIAGNOSIS OF HYPERTENSION - continue medications, DASH diet, exercise and monitor at home. Call if greater than 130/80.  -     CBC with Differential/Platelet -     BASIC METABOLIC PANEL WITH GFR -     Hepatic function panel -     Urinalysis, Routine w reflex microscopic -     Microalbumin / creatinine urine ratio -     EKG 12-Lead  Mixed hyperlipidemia -continue medications, check lipids, decrease fatty foods, increase activity.  - will likely start on statin with new LBBB -     Lipid panel  Medication management  Vitamin D deficiency -     VITAMIN D 25 Hydroxy (Vit-D Deficiency, Fractures)  Fibromyalgia Try tumeric 84m BID  DEGENERATION, LUMBAR/LUMBOSACRAL DISC Monitor  Allergic rhinitis due to pollen, unspecified seasonality Allergic rhinitis - Allegra OTC, increase H20, allergy hygiene explained.  BMI 29.0-29.9,adult  Overweight  - long discussion about weight loss, diet, and exercise -recommended diet heavy in fruits and veggies and low in animal meats, cheeses, and dairy products  Fatigue, unspecified type -     Iron,Total/Total Iron Binding Cap -     Vitamin B12  Screening, anemia, deficiency, iron -     Iron,Total/Total Iron Binding Cap -     Vitamin B12  Cerumen impaction/ bilateral ear pain - stop using Qtips, irrigation used in the office without complications, use OTC drops/oil at home to prevent reoccurence  Discussed med's effects and SE's. Screening labs and tests as  requested with regular follow-up as recommended.  HPI  62 y.o. female  presents for a complete physical.  She had cough/bronchitis in August, did not have CXR DUE TO COST and DECLINES. She states since that time, when she vaccums/cleans/does yard work she has fatigue and has to sit down for a while to recover, denies SOB, CP, dizziness, sweating during this stage. She gets hot flashes at night and day. She has history of costochondritis but states no chest pain.   Her blood pressure has been controlled at home, today their BP is BP: 134/78.  She does not workout. She denies chest pain, shortness of breath, dizziness.   She is not on cholesterol medication and denies myalgias. Her cholesterol is at goal. The cholesterol last visit was:  Lab Results  Component Value Date   CHOL 267 (H) 04/19/2017   HDL 54 04/19/2017   LDLCALC 150 (H) 04/19/2017   LDLDIRECT 75.3 09/11/2010   TRIG 316 (H) 04/19/2017   CHOLHDL 4.9 04/19/2017  . She has been working on diet and exercise for prediabetes, she is on bASA, she is not on ACE/ARB and denies foot ulcerations, hyperglycemia, hypoglycemia , increased appetite, nausea, paresthesia of the feet, polydipsia, polyuria, visual disturbances, vomiting and weight loss. Last A1C in the office was:  Lab Results  Component Value Date   HGBA1C 5.4 09/07/2016   Patient is on Vitamin D supplement, she is on 5000 IU daily Lab Results  Component Value Date   VD25OH 24 (L) 09/07/2016  She has not seen obgyn recently.  Her last pap smear appears to be in 2008.  She has not had one since then, declines today, will consider getting another visit.   She is on thyroid medication. Her medication was not changed last visit.   Lab Results  Component Value Date   TSH 1.01 04/19/2017   BMI is Body mass index is 29.56 kg/m., she is working on diet and exercise. Wt Readings from Last 3 Encounters:  12/21/17 194 lb 6.4 oz (88.2 kg)  06/02/17 190 lb 9.6 oz (86.5 kg)   04/19/17 191 lb 6.4 oz (86.8 kg)   Current Medications:  Current Outpatient Medications on File Prior to Visit  Medication Sig Dispense Refill  . esomeprazole (NEXIUM) 20 MG capsule Take 20 mg by mouth daily at 12 noon.    . ezetimibe (ZETIA) 10 MG tablet Take 1 tablet (10 mg total) by mouth daily. 30 tablet 3  . levothyroxine (SYNTHROID, LEVOTHROID) 112 MCG tablet TAKE 1 TABLET BY MOUTH ONCE DAILY ON AN EMPTY STOMACH 30 TO 60 MINUTES BEFORE A MEAL. 90 tablet 3  . vitamin E (VITAMIN E) 400 UNIT capsule Take 400 Units by mouth daily.     No current facility-administered medications on file prior to visit.     Health Maintenance:   Immunization History  Administered Date(s) Administered  . Influenza Split 07/10/2014, 08/06/2015  . Influenza Whole 07/06/2007  . Influenza,inj,quad, With Preservative 09/07/2016  . Td 10/05/2001  . Tdap 08/06/2015   Tetanus: 2016 Pneumovax: Not indicated Flu vaccine: 2017 Pap: 2008, declines today but will think about, discussed that if one more was normal would be last one.   MGM:04/2017 Colonoscopy: refused.  Will want to do cologuard when financially feasible, states not covered by insurance.  Last Dental Exam:  2017 Last Eye Exam:  [facility]  Patient Care Team: Lucky Cowboy, MD as PCP - General (Internal Medicine)  Medical History:  Past Medical History:  Diagnosis Date  . GERD (gastroesophageal reflux disease)   . Unspecified hypothyroidism   . Unspecified vitamin D deficiency    Allergies Allergies  Allergen Reactions  . Codeine     REACTION: hallucinations  . Mometasone Furoate     REACTION: headache    SURGICAL HISTORY She  has no past surgical history on file. FAMILY HISTORY Her family history includes Arthritis in her mother; Breast cancer in her sister; COPD in her mother; Cancer in her sister; Depression in her mother and sister; Heart disease in her father and sister; Hypertension in her mother; Kidney  disease in her mother. SOCIAL HISTORY She  reports that she quit smoking about 36 years ago. she has never used smokeless tobacco. She reports that she does not drink alcohol or use drugs.  Smoked 10-15 years  Review of Systems: Review of Systems  Constitutional: Positive for malaise/fatigue. Negative for chills, diaphoresis, fever and weight loss.  HENT: Negative for congestion, ear pain, hearing loss, sinus pain, sore throat and tinnitus.   Eyes: Negative.   Respiratory: Positive for cough. Negative for shortness of breath and wheezing.   Cardiovascular: Positive for chest pain (has history of costochondritits). Negative for palpitations and leg swelling.  Gastrointestinal: Negative for abdominal pain, blood in stool, constipation, diarrhea, heartburn and melena.  Genitourinary: Negative.   Skin: Negative.   Neurological: Negative for dizziness, sensory change, loss of consciousness, weakness and headaches.  Psychiatric/Behavioral: Negative for depression. The patient is not nervous/anxious and does not have insomnia.  Physical Exam: Estimated body mass index is 29.56 kg/m as calculated from the following:   Height as of this encounter: 5\' 8"  (1.727 m).   Weight as of this encounter: 194 lb 6.4 oz (88.2 kg). BP 134/78   Pulse 78   Temp (!) 97.5 F (36.4 C)   Ht 5\' 8"  (1.727 m)   Wt 194 lb 6.4 oz (88.2 kg)   LMP  (LMP Unknown)   SpO2 97%   BMI 29.56 kg/m   General Appearance: Well nourished well developed, in no apparent distress.  Eyes: PERRLA, EOMs, conjunctiva no swelling or erythema ENT/Mouth: Left Ear canals normal without obstruction,Right ear with some canal redness and swelling.  No left ear swelling, erythema, or discharge.  TMs normal bilaterally with no erythema, bulging, retraction, or loss of landmark.  Oropharynx moist and clear with no exudate, erythema, or swelling.   Neck: Supple, thyroid normal. No bruits.  No cervical adenopathy Respiratory: Respiratory  effort normal, Breath sounds clear A&P without wheeze, rhonchi, rales.   Cardio: RRR without murmurs, rubs or gallops. Brisk peripheral pulses without edema.  Chest: symmetric, with normal excursions, + pain with palpation Abdomen: Soft, nontender, no guarding, rebound, hernias, masses, or organomegaly.  Lymphatics: Non tender without lymphadenopathy.  Genitourinary: Patient declined pap smear and pelvic exam Musculoskeletal: Full ROM all peripheral extremities,5/5 strength, and normal gait.  Skin: Warm, dry without rashes, lesions, ecchymosis. Neuro: Awake and oriented X 3, Cranial nerves intact, reflexes equal bilaterally. Normal muscle tone, no cerebellar symptoms. Sensation intact.  Psych:  normal affect, Insight and Judgment appropriate.   EKG: NEW LBBB  Over 40 minutes of exam, counseling, chart review and critical decision making was performed  Quentin MullingAmanda Javad Salva 9:28 AM [facility]

## 2017-12-21 ENCOUNTER — Ambulatory Visit: Payer: 59 | Admitting: Physician Assistant

## 2017-12-21 ENCOUNTER — Encounter: Payer: Self-pay | Admitting: Physician Assistant

## 2017-12-21 VITALS — BP 134/78 | HR 78 | Temp 97.5°F | Ht 68.0 in | Wt 194.4 lb

## 2017-12-21 DIAGNOSIS — H6123 Impacted cerumen, bilateral: Secondary | ICD-10-CM

## 2017-12-21 DIAGNOSIS — R002 Palpitations: Secondary | ICD-10-CM

## 2017-12-21 DIAGNOSIS — J301 Allergic rhinitis due to pollen: Secondary | ICD-10-CM

## 2017-12-21 DIAGNOSIS — M51379 Other intervertebral disc degeneration, lumbosacral region without mention of lumbar back pain or lower extremity pain: Secondary | ICD-10-CM

## 2017-12-21 DIAGNOSIS — Z0001 Encounter for general adult medical examination with abnormal findings: Secondary | ICD-10-CM

## 2017-12-21 DIAGNOSIS — I447 Left bundle-branch block, unspecified: Secondary | ICD-10-CM

## 2017-12-21 DIAGNOSIS — Z136 Encounter for screening for cardiovascular disorders: Secondary | ICD-10-CM

## 2017-12-21 DIAGNOSIS — R5383 Other fatigue: Secondary | ICD-10-CM

## 2017-12-21 DIAGNOSIS — E782 Mixed hyperlipidemia: Secondary | ICD-10-CM

## 2017-12-21 DIAGNOSIS — R030 Elevated blood-pressure reading, without diagnosis of hypertension: Secondary | ICD-10-CM | POA: Diagnosis not present

## 2017-12-21 DIAGNOSIS — H9203 Otalgia, bilateral: Secondary | ICD-10-CM | POA: Diagnosis not present

## 2017-12-21 DIAGNOSIS — Z6829 Body mass index (BMI) 29.0-29.9, adult: Secondary | ICD-10-CM

## 2017-12-21 DIAGNOSIS — E039 Hypothyroidism, unspecified: Secondary | ICD-10-CM

## 2017-12-21 DIAGNOSIS — M797 Fibromyalgia: Secondary | ICD-10-CM

## 2017-12-21 DIAGNOSIS — M5137 Other intervertebral disc degeneration, lumbosacral region: Secondary | ICD-10-CM

## 2017-12-21 DIAGNOSIS — R6889 Other general symptoms and signs: Secondary | ICD-10-CM

## 2017-12-21 DIAGNOSIS — Z79899 Other long term (current) drug therapy: Secondary | ICD-10-CM

## 2017-12-21 DIAGNOSIS — E559 Vitamin D deficiency, unspecified: Secondary | ICD-10-CM

## 2017-12-21 DIAGNOSIS — Z130 Encounter for screening for diseases of the blood and blood-forming organs and certain disorders involving the immune mechanism: Secondary | ICD-10-CM

## 2017-12-21 NOTE — Patient Instructions (Addendum)
Call insurance and find out if Xray is cheaper anywhere else Would like to get a chest xray  Tumeric with black pepper extract is a great natural antiinflammatory that helps with arthritis and aches and pain. Can get from [employer] or any health food store. Need to take at least 866m twice a day with food.   Benefiber or Citracel is good for constipation/diarrhea/irritable bowel syndrome, it helps with weight loss and can help lower your bad cholesterol. Please do 1 TBSP in the morning in water, coffee, or tea. It can take up to a month before you can see a difference with your bowel movements. It is cheapest from [employer], [employer], [employer].   Cologuard is an easy to use noninvasive colon cancer screening test based on the latest advances in stool DNA science.   Colon cancer is 3rd most diagnosed cancer and 2nd leading cause of death in both men and women 566years of age and older despite being one of the most preventable and treatable cancers if found early.  4 of out 5 people diagnosed with colon cancer have NO prior family history.  When caught EARLY 90% of colon cancer is curable.   You have agreed to do a Cologuard screening and have declined a colonoscopy in spite of being explained the risks and benefits of the colonoscopy in detail, including cancer and death. Please understand that this is test not as sensitive or specific as a colonoscopy and you are still recommended to get a colonoscopy.   If you are NOT medicare please call your insurance company and give them these items to see if they will cover it: 1) CPT code, [id number]) Provider is EProbation officer3) [employer] NPI #[id number]) [facility] [place] ID #[id number] Out-of-pocket cost for Cologuard can range from $0 - $649 so please call  You will receive a short call from [facility] at [employer] when you receive a call they will say they are from [place]  to confirm your  mailing address and give you more information.  When they calll you, it will appear on the caller ID as "[employer]" or in some cases only this number will appear, [phone]   [employer] SThe [employer] Companieswill ship your collection kit directly to you. You will collect a single stool sample in the privacy of your own home, no special preparation required. You will return the kit via [facility]-paid shipping or pick-up, in the same box it arrived in. Then I will contact you to discuss your results after I receive them from the laboratory.   If you have any questions or concerns, Cologuard Customer Support Specialist are available 24 hours a day, 7 days a week at [phone] go to [email or web address]       When it comes to diets, agreement about the perfect plan isn't easy to find, even among the experts. Experts at the [employer]ed an idea known as the Healthy Eating Plate. Just imagine a plate divided into logical, healthy portions.  The emphasis is on diet quality:  Load up on vegetables and fruits - one-half of your plate: Aim for color and variety, and remember that potatoes don't count.  Go for whole grains - one-quarter of your plate: Whole wheat, barley, wheat berries, quinoa, oats, brown rice, and foods made with them. If you want pasta, go with whole wheat pasta.  Protein power - one-quarter of your plate: Fish, chicken,  beans, and nuts are all healthy, versatile protein sources. Limit red meat.  The diet, however, does go beyond the plate, offering a few other suggestions.  Use healthy plant oils, such as olive, canola, soy, corn, sunflower and peanut. Check the labels, and avoid partially hydrogenated oil, which have unhealthy trans fats.  If you're thirsty, drink water. Coffee and tea are good in moderation, but skip sugary drinks and limit milk and dairy products to one or two daily servings.  The type of carbohydrate in the diet is  more important than the amount. Some sources of carbohydrates, such as vegetables, fruits, whole grains, and beans-are healthier than others.  Finally, stay active.  8 Critical Weight-Loss Tips That Aren't Diet and Exercise  1. STARVE THE DISTRACTIONS  All too often when we eat, we're also multitasking: watching TV, answering emails, scrolling through social media. These habits are detrimental to having a strong, clear, healthy relationship with food, and they can hinder our ability to make dietary changes.  In order to truly focus on what you're eating, how much you're eating, why you're eating those specific foods and, most importantly, how those foods make you feel, you need to starve the distractions. That means when you eat, just eat. Focus on your food, the process it went through to end up on your plate, where it came from and how it nourishes you. With this technique, you're more likely to finish a meal feeling satiated.  2.  CONSIDER WHAT YOU'RE NOT WILLING TO DO  This might sound counterintuitive, but it can help provide a "why" when motivation is waning. Declare, in writing, what you are unwilling to do, for example "I am unwilling to be the old dad who cannot play sports with my children".  So consider what you're not willing to accept, write it down, and keep it at the ready.  3.  STOP LABELING FOOD "GOOD" AND "BAD"  You've probably heard someone say they ate something "bad." Maybe you've even said it yourself.  The trouble with 'bad' foods isn't that they'll send you to the grave after a bite or two. The trouble comes when we eat excessive portions of really calorie-dense foods meal after meal, day after day.  Instead of labeling foods as good or bad, think about which foods you can eat a lot of, and which ones you should just eat a little of. Then, plan ways to eat the foods you really like in portions that fit with your overall goals. A good example of this would be having a  slice of pizza alongside a club salad with chicken breast, avocado and a bit of dressing. This is vastly different than 3 slices of pizza, 4 breadsticks with cheese sauce and half of a liter of regular soda.  4.  BRUSH YOUR TEETH AFTER YOU EAT  Getting your mindset in order is important, but sometimes small habits can make a big difference. After eating, you still have the taste of food in their mouth, which often causes people to eat more even if they are full or engage in a nibble or two of dessert.  Brushing your teeth will remove the taste of food from your mouth, and the clean, minty freshness will serve as a cue that mealtime is over.  5.  FOCUS ON CROWDING NOT CUTTING  The most common first step during 'dieting' is to cut. We cut our portion sizes down, we cut out 'bad' foods, we cut out entire food groups. This  act of cutting puts [place] and our minds into scarcity mode.  When something is off-limits, even if you're able to avoid it for a while, you could end up bingeing on it later because you've gone so long without it. So, instead of cutting, focus on crowding. If you crowd your plate and fill it up with more foods like veggies and protein, it simply allows less room for the other stuff. In other words, shift your focus away from what you can't eat, and celebrate the foods that will help you reach your goals.  6.  TAKE TRACKING A STEP FURTHER  Track what you eat, when you ate it, how much you ate and how that food made you feel. Being completely honest with yourself and writing down every single thing that passes through your lips will help you start to notice that maybe you actually do snack, possibly take in more sugar than you thought, eat when you're bored rather than just hungry or maybe that you have a habit of snacking before bed while watching TV.  The difference from simply tracking your food intake is you're taking into account how food makes you feel, as well as what you're doing  while you're eating. This is about becoming more mindful of what, when and why you eat.  7.  PRIORITIZE GOOD SLEEP  One of the strongest risk factors for being overweight is poor sleep. When you're feeling tired, you're more likely to choose unhealthy comfort foods and to skip your workout. Additionally, sleep deprivation may slow down your metabolism. [employer]! Therefore, sleeping 7-8 hours per night can help with weight loss without having to change your diet or increase your physical activity. And if you feel you snore and still wake up tired, talk with me about sleep apnea.  8.  SET ASIDE TIME TO DISCONNECT  Just get out there. Disconnect from the electronics and connect to the elements. Not only will this help reduce stress (a major factor in weight gain) by giving your mind a break from the constant stimulation we've all become so accustomed to, but it may also reprogram your brain to connect with yourself and what you're feeling.

## 2017-12-22 LAB — HEPATIC FUNCTION PANEL
AG Ratio: 1.3 (calc) (ref 1.0–2.5)
ALT: 12 U/L (ref 6–29)
AST: 17 U/L (ref 10–35)
Albumin: 4.3 g/dL (ref 3.6–5.1)
Alkaline phosphatase (APISO): 110 U/L (ref 33–130)
BILIRUBIN DIRECT: 0.1 mg/dL (ref 0.0–0.2)
BILIRUBIN INDIRECT: 0.3 mg/dL (ref 0.2–1.2)
BILIRUBIN TOTAL: 0.4 mg/dL (ref 0.2–1.2)
Globulin: 3.3 g/dL (calc) (ref 1.9–3.7)
Total Protein: 7.6 g/dL (ref 6.1–8.1)

## 2017-12-22 LAB — URINALYSIS, ROUTINE W REFLEX MICROSCOPIC
Bilirubin Urine: NEGATIVE
Glucose, UA: NEGATIVE
HGB URINE DIPSTICK: NEGATIVE
Ketones, ur: NEGATIVE
LEUKOCYTES UA: NEGATIVE
NITRITE: NEGATIVE
PROTEIN: NEGATIVE
Specific Gravity, Urine: 1.01 (ref 1.001–1.03)
pH: 6.5 (ref 5.0–8.0)

## 2017-12-22 LAB — CBC WITH DIFFERENTIAL/PLATELET
Basophils Absolute: 73 cells/uL (ref 0–200)
Basophils Relative: 1.1 %
EOS ABS: 92 {cells}/uL (ref 15–500)
Eosinophils Relative: 1.4 %
HEMATOCRIT: 41.5 % (ref 35.0–45.0)
Hemoglobin: 14.2 g/dL (ref 11.7–15.5)
Lymphs Abs: 1822 cells/uL (ref 850–3900)
MCH: 29.4 pg (ref 27.0–33.0)
MCHC: 34.2 g/dL (ref 32.0–36.0)
MCV: 85.9 fL (ref 80.0–100.0)
MPV: 8.9 fL (ref 7.5–12.5)
Monocytes Relative: 7.8 %
Neutro Abs: 4099 cells/uL (ref 1500–7800)
Neutrophils Relative %: 62.1 %
PLATELETS: 311 10*3/uL (ref 140–400)
RBC: 4.83 10*6/uL (ref 3.80–5.10)
RDW: 13.6 % (ref 11.0–15.0)
TOTAL LYMPHOCYTE: 27.6 %
WBC: 6.6 10*3/uL (ref 3.8–10.8)
WBCMIX: 515 {cells}/uL (ref 200–950)

## 2017-12-22 LAB — BASIC METABOLIC PANEL WITH GFR
BUN: 11 mg/dL (ref 7–25)
CHLORIDE: 104 mmol/L (ref 98–110)
CO2: 30 mmol/L (ref 20–32)
Calcium: 9.6 mg/dL (ref 8.6–10.4)
Creat: 0.89 mg/dL (ref 0.50–0.99)
GFR, EST AFRICAN AMERICAN: 81 mL/min/{1.73_m2} (ref >=60)
GFR, EST NON AFRICAN AMERICAN: 69 mL/min/{1.73_m2} (ref >=60)
Glucose, Bld: 86 mg/dL (ref 65–99)
POTASSIUM: 4.4 mmol/L (ref 3.5–5.3)
Sodium: 140 mmol/L (ref 135–146)

## 2017-12-22 LAB — VITAMIN D 25 HYDROXY (VIT D DEFICIENCY, FRACTURES): VIT D 25 HYDROXY: 34 ng/mL (ref 30–100)

## 2017-12-22 LAB — IRON, TOTAL/TOTAL IRON BINDING CAP
%SAT: 21 % (calc) (ref 11–50)
IRON: 78 ug/dL (ref 45–160)
TIBC: 375 ug/dL (ref 250–450)

## 2017-12-22 LAB — MICROALBUMIN / CREATININE URINE RATIO
CREATININE, URINE: 77 mg/dL (ref 20–275)
MICROALB UR: 0.7 mg/dL
MICROALB/CREAT RATIO: 9 ug/mg{creat} (ref <30)

## 2017-12-22 LAB — TSH: TSH: 1.73 m[IU]/L (ref 0.40–4.50)

## 2017-12-22 LAB — CK TOTAL AND CKMB (NOT AT ARMC)
CK TOTAL: 77 U/L (ref 29–143)
CK, MB: <0.7 ng/mL (ref 0–5.0)

## 2017-12-22 LAB — LIPID PANEL
Cholesterol: 257 mg/dL — ABNORMAL HIGH (ref <200)
HDL: 54 mg/dL (ref >50)
LDL CHOLESTEROL (CALC): 150 mg/dL — AB
NON-HDL CHOLESTEROL (CALC): 203 mg/dL — AB (ref <130)
TRIGLYCERIDES: 342 mg/dL — AB (ref <150)
Total CHOL/HDL Ratio: 4.8 (calc) (ref <5.0)

## 2017-12-22 LAB — VITAMIN B12: Vitamin B-12: 417 pg/mL (ref 200–1100)

## 2017-12-22 LAB — TROPONIN I: Troponin I: 0.01 ng/mL (ref <=0.0)

## 2017-12-23 ENCOUNTER — Encounter: Payer: Self-pay | Admitting: Cardiology

## 2017-12-23 ENCOUNTER — Ambulatory Visit (HOSPITAL_BASED_OUTPATIENT_CLINIC_OR_DEPARTMENT_OTHER)
Admission: RE | Admit: 2017-12-23 | Discharge: 2017-12-23 | Disposition: A | Discharge status: Home / Self Care | Payer: 59 | Source: Ambulatory Visit | Attending: Cardiology | Admitting: Cardiology

## 2017-12-23 ENCOUNTER — Ambulatory Visit: Payer: 59 | Admitting: Cardiology

## 2017-12-23 VITALS — BP 190/100 | HR 82 | Ht 68.0 in | Wt 193.8 lb

## 2017-12-23 DIAGNOSIS — I10 Essential (primary) hypertension: Secondary | ICD-10-CM | POA: Insufficient documentation

## 2017-12-23 DIAGNOSIS — R0602 Shortness of breath: Secondary | ICD-10-CM | POA: Insufficient documentation

## 2017-12-23 DIAGNOSIS — E782 Mixed hyperlipidemia: Secondary | ICD-10-CM | POA: Diagnosis not present

## 2017-12-23 DIAGNOSIS — R030 Elevated blood-pressure reading, without diagnosis of hypertension: Secondary | ICD-10-CM

## 2017-12-23 DIAGNOSIS — I447 Left bundle-branch block, unspecified: Secondary | ICD-10-CM | POA: Insufficient documentation

## 2017-12-23 NOTE — Patient Instructions (Signed)
Medication Instructions:  Your physician recommends that you continue on your current medications as directed. Please refer to the Current Medication list given to you today.  Labwork: None  Testing/Procedures: Your physician has requested that you have an echocardiogram. Echocardiography is a painless test that uses sound waves to create images of your heart. It provides your doctor with information about the size and shape of your heart and how well your heart's chambers and valves are working. This procedure takes approximately one hour. There are no restrictions for this procedure.  Your physician has requested that you have a lexiscan myoview. For further information please visit [email or web address]. Please follow instruction sheet, as given.  A chest x-ray takes a picture of the organs and structures inside the chest, including the heart, lungs, and blood vessels. This test can show several things, including, whether the heart is enlarges; whether fluid is building up in the lungs; and whether pacemaker / defibrillator leads are still in place.  Follow-Up: Your physician recommends that you schedule a follow-up appointment in: 1 year  Any Other Special Instructions Will Be Listed Below (If Applicable).     If you need a refill on your cardiac medications before your next appointment, please call your pharmacy.   [facility]  Garey HamAshley A, RN, BSN

## 2017-12-23 NOTE — Addendum Note (Signed)
Addended by: Craige CottaANDERSON, Marely Apgar S on: 12/23/2017 10:28 AM   Modules accepted: Orders

## 2017-12-23 NOTE — Progress Notes (Signed)
Cardiology Office Note:    Date:  12/23/2017   ID:  Denise Jensen, DOB 05/28/1956, MRN [id number]  PCP:  Lucky Cowboy, MD  Cardiologist:  Garwin Brothers, MD   Referring MD: Quentin Mulling, PA-C    ASSESSMENT:    1. Mixed hyperlipidemia   2. Shortness of breath on exertion   3. Elevated blood pressure reading in office with white coat syndrome, without diagnosis of hypertension   4. White coat syndrome without diagnosis of hypertension    PLAN:    In order of problems listed above:  1. Primary prevention stressed with the patient.  Importance of compliance with diet and medications stressed and she vocalized understanding. 2. Her blood pressure is elevated.  She has an element of whitecoat hypertension.  I am concerned with the pressures are high at home.  I told her to buy a blood pressure machine and to get it checked at the doctor's office and her multiple blood pressure recordings brought to [place] with 3. Lipids were reviewed extensively.  They are markedly elevated including triglycerides.  Diet was discussed at extensive length and she promises to be meticulous with this.  I suspect if this does not make it better than she may have to be initiated on lipid-lowering medications. 4. Patient will undergo Lexiscan sestamibi to assess for any objective evidence of obstructive coronary artery disease in view of her symptoms and risk factors.  Echocardiogram will be done to assess murmur heard on auscultation.  She says the symptoms started after a pneumonic process which was several weeks ago.  In view of this I will obtain a chest x-ray. 5. Patient will be seen in follow-up appointment in 1 month or earlier if the patient has any concerns    Medication Adjustments/Labs and Tests Ordered: Current medicines are reviewed at length with the patient today.  Concerns regarding medicines are outlined above.  No orders of the defined types were placed in this encounter.  No orders of  the defined types were placed in this encounter.    History of Present Illness:    Denise Jensen is a 62 y.o. female who is being seen today for the evaluation of essential hypertension, abnormal EKG left bundle branch block at the request of Quentin Mulling, PA-C.  Patient is a pleasant 62 year old female.  She mentions to me that she has no diagnosis of hypertension.  She tells me that she is under significant stress.  She tells me that her blood pressure is elevated at the doctor's office.  I asked that her cholesterol is elevated and she tells me that it was mildly elevated.  She denies any chest pain orthopnea or PND.  She shortness of breath on exertion which she says she has had a pneumonia in the past and has been treated and subsequently this is happened.  No orthopnea or PND.  No chest tightness or chest pain.  At the time of my evaluation, the patient is alert awake oriented and in no distress.  She has also been found to have left bundle branch block on the EKG and he is sent here for evaluation.  Past Medical History:  Diagnosis Date  . GERD (gastroesophageal reflux disease)   . Unspecified hypothyroidism   . Unspecified vitamin D deficiency     History reviewed. No pertinent surgical history.  Current Medications: Current Meds  Medication Sig  . aspirin EC 81 MG tablet Take 81 mg by mouth daily.  Marland Kitchen esomeprazole (NEXIUM)  20 MG capsule Take 20 mg by mouth daily at 12 noon.  Marland Kitchen. levothyroxine (SYNTHROID, LEVOTHROID) 112 MCG tablet TAKE 1 TABLET BY MOUTH ONCE DAILY ON AN EMPTY STOMACH 30 TO 60 MINUTES BEFORE A MEAL.  . vitamin E (VITAMIN E) 400 UNIT capsule Take 400 Units by mouth daily.  . [DISCONTINUED] ezetimibe (ZETIA) 10 MG tablet Take 1 tablet (10 mg total) by mouth daily.     Allergies:   Codeine and Mometasone furoate   Social History   Socioeconomic History  . Marital status: Married    Spouse name: Not on file  . Number of children: Not on file  . Years of  education: Not on file  . Highest education level: Not on file  Occupational History  . Not on file  Social Needs  . Financial resource strain: Not on file  . Food insecurity:    Worry: Not on file    Inability: Not on file  . Transportation needs:    Medical: Not on file    Non-medical: Not on file  Tobacco Use  . Smoking status: Former Smoker    Last attempt to quit: 10/05/1981    Years since quitting: 36.2  . Smokeless tobacco: Never Used  Substance and Sexual Activity  . Alcohol use: No  . Drug use: No  . Sexual activity: Not on file  Lifestyle  . Physical activity:    Days per week: Not on file    Minutes per session: Not on file  . Stress: Not on file  Relationships  . Social connections:    Talks on phone: Not on file    Gets together: Not on file    Attends religious service: Not on file    Active member of club or organization: Not on file    Attends meetings of clubs or organizations: Not on file    Relationship status: Not on file  Other Topics Concern  . Not on file  Social History Narrative  . Not on file     Family History: The patient's family history includes Arthritis in her mother; Breast cancer in her sister; COPD in her mother; Cancer in her sister; Depression in her mother and sister; Heart disease in her father and sister; Hypertension in her mother; Kidney disease in her mother.  ROS:   Please see the history of present illness.    All other systems reviewed and are negative.  EKGs/Labs/Other Studies Reviewed:    The following studies were reviewed today: I reviewed records from primary care physician's office including lipids at extensive length.   Recent Labs: 04/19/2017: Magnesium 2.2 12/21/2017: ALT 12; BUN 11; Creat 0.89; Hemoglobin 14.2; Platelets 311; Potassium 4.4; Sodium 140; TSH 1.73  Recent Lipid Panel    Component Value Date/Time   CHOL 257 (H) 12/21/2017 0945   TRIG 342 (H) 12/21/2017 0945   HDL 54 12/21/2017 0945   CHOLHDL  4.8 12/21/2017 0945   VLDL 63 (H) 04/19/2017 1147   LDLCALC 150 (H) 12/21/2017 0945   LDLDIRECT 75.3 09/11/2010 0841    Physical Exam:    VS:  BP (!) 190/100 (BP Location: Right Arm, Patient Position: Sitting, Cuff Size: Normal)   Pulse 82   Ht 5\' 8"  (1.727 m)   Wt 193 lb 12.8 oz (87.9 kg)   LMP  (LMP Unknown)   SpO2 98%   BMI 29.47 kg/m     Wt Readings from Last 3 Encounters:  12/23/17 193 lb 12.8 oz (87.9 kg)  12/21/17 194 lb 6.4 oz (88.2 kg)  06/02/17 190 lb 9.6 oz (86.5 kg)     GEN: Patient is in no acute distress HEENT: Normal NECK: No JVD; No carotid bruits LYMPHATICS: No lymphadenopathy CARDIAC: S1 S2 regular, 2/6 systolic murmur at the apex. RESPIRATORY:  Clear to auscultation without rales, wheezing or rhonchi  ABDOMEN: Soft, non-tender, non-distended MUSCULOSKELETAL:  No edema; No deformity  SKIN: Warm and dry NEUROLOGIC:  Alert and oriented x 3 PSYCHIATRIC:  Normal affect    Signed, Garwin Brothers, MD  12/23/2017 10:20 AM    [facility]

## 2017-12-29 ENCOUNTER — Other Ambulatory Visit: Payer: Self-pay | Admitting: Physician Assistant

## 2018-01-03 ENCOUNTER — Encounter (HOSPITAL_COMMUNITY): Payer: 59

## 2018-01-03 ENCOUNTER — Ambulatory Visit (HOSPITAL_COMMUNITY): Payer: 59 | Attending: Internal Medicine

## 2018-01-03 DIAGNOSIS — E782 Mixed hyperlipidemia: Secondary | ICD-10-CM

## 2018-01-03 DIAGNOSIS — R0602 Shortness of breath: Secondary | ICD-10-CM

## 2018-01-03 DIAGNOSIS — I340 Nonrheumatic mitral (valve) insufficiency: Secondary | ICD-10-CM | POA: Diagnosis not present

## 2018-01-03 DIAGNOSIS — R030 Elevated blood-pressure reading, without diagnosis of hypertension: Secondary | ICD-10-CM | POA: Diagnosis not present

## 2018-01-03 DIAGNOSIS — I5189 Other ill-defined heart diseases: Secondary | ICD-10-CM | POA: Diagnosis not present

## 2018-04-26 ENCOUNTER — Ambulatory Visit: Payer: 59 | Admitting: Adult Health

## 2018-04-26 ENCOUNTER — Encounter: Payer: Self-pay | Admitting: Adult Health

## 2018-04-26 VITALS — BP 168/96 | HR 73 | Temp 97.5°F | Ht 68.0 in | Wt 186.0 lb

## 2018-04-26 DIAGNOSIS — H6122 Impacted cerumen, left ear: Secondary | ICD-10-CM

## 2018-04-26 DIAGNOSIS — I10 Essential (primary) hypertension: Secondary | ICD-10-CM | POA: Diagnosis not present

## 2018-04-26 MED ORDER — BISOPROLOL-HYDROCHLOROTHIAZIDE 5-6.25 MG PO TABS: 1.0000 | ORAL_TABLET | Freq: Every day | ORAL | 1 refills | Status: DC

## 2018-04-26 NOTE — Progress Notes (Signed)
Assessment and Plan:  Denise Jensen was seen today for ear fullness and dizziness.  Diagnoses and all orders for this visit:  Hearing loss of left ear due to cerumen impaction - stop using Qtips, irrigation used in the office without complications, symptoms resolved, use OTC drops/oil at home to prevent reoccurence  White coat syndrome with hypertension Very resistant to starting medication but discussed risks of untreated hypertension at length and will start low dose of combination agent to try to minimize potential for SE to encourage adherence Will have her start with 1/2 tab for 3 days then increase to full tab if needed for goal BP <130/80 Monitor blood pressure at home; call if consistently over 130/80 Discussed DASH diet Advised to go to the ER if any CP, SOB, nausea, dizziness, severe HA, changes vision/speech, left arm numbness and tingling and jaw pain. Follow up in 2 weeks for BP recheck -     bisoprolol-hydrochlorothiazide (ZIAC) 5-6.25 MG tablet; Take 1 tablet by mouth daily.  Further disposition pending results of labs. Discussed med's effects and SE's.   Over 15 minutes of exam, counseling, chart review, and critical decision making was performed.   Future Appointments  Date Time Provider Department [facility]  05/09/2018 10:30 AM [facility] NURSE [facility] None  06/27/2018  9:30 AM Quentin Mullingollier, Amanda, PA-C [facility] None  12/27/2018  9:00 AM Quentin Mullingollier, Amanda, PA-C [facility] None    ------------------------------------------------------------------------------------------------------------------   HPI BP (!) 168/96   Pulse 73   Temp (!) 97.5 F (36.4 C)   Ht 5\' 8"  (1.727 m)   Wt 186 lb (84.4 kg)   LMP  (LMP Unknown)   SpO2 99%   BMI 28.28 kg/m   62 y.o.female presents for evaluation of full sensation of left ear, decreased hearing of left, "feels like my face on that side is like sandpaper" - she reports this really became worse yesterday, but reports she had August last  year and has had residual coughing since. She reports she frequently needs her ear irrigated out and wonders if this might help.   She does have white coat syndrome, recently had reading of 190/100 while at cardiology, has been checking her BP at home and reports has been running 140s/80s. She is very resistant to starting BP medication, reports she is very afraid to start medication due to multiple acquaintances having severe muscle cramps and fatigue.    Past Medical History:  Diagnosis Date  . GERD (gastroesophageal reflux disease)   . Unspecified hypothyroidism   . Unspecified vitamin D deficiency      Allergies  Allergen Reactions  . Codeine     REACTION: hallucinations  . Mometasone Furoate     REACTION: headache    Current Outpatient Medications on File Prior to Visit  Medication Sig  . esomeprazole (NEXIUM) 20 MG capsule Take 20 mg by mouth daily at 12 noon.  . Fluticasone Propionate (FLONASE NA) Place into the nose.  . levothyroxine (SYNTHROID, LEVOTHROID) 112 MCG tablet TAKE 1 TABLET BY MOUTH ONCE DAILY ON AN EMPTY STOMACH 30 TO 60 MINUTES BEFORE A MEAL.  Marland Kitchen. Loratadine (CLARITIN) 10 MG CAPS Take by mouth.  . vitamin E (VITAMIN E) 400 UNIT capsule Take 400 Units by mouth daily.  Marland Kitchen. aspirin EC 81 MG tablet Take 81 mg by mouth daily.   No current facility-administered medications on file prior to visit.     ROS: all negative except above.   Physical Exam:  BP (!) 168/96   Pulse 73  Temp (!) 97.5 F (36.4 C)   Ht 5\' 8"  (1.727 m)   Wt 186 lb (84.4 kg)   LMP  (LMP Unknown)   SpO2 99%   BMI 28.28 kg/m   General Appearance: Well nourished, in no apparent distress. Eyes: PERRLA, EOMs, conjunctiva no swelling or erythema Sinuses: No Frontal/maxillary tenderness ENT/Mouth: R Ext aud canals clear, L fully obstructed by firm wax, TMs without erythema, bulging. No erythema, swelling, or exudate on post pharynx.  Tonsils not swollen or erythematous. Hearing normal.  Neck:  Supple, thyroid normal.  Respiratory: Respiratory effort normal, BS equal bilaterally without rales, rhonchi, wheezing or stridor.  Cardio: RRR with no MRGs. Brisk peripheral pulses without edema.  Abdomen: Soft, + BS.  Non tender, no guarding, rebound, hernias, masses. Lymphatics: Non tender without lymphadenopathy.  Musculoskeletal: Symmetrical strength, normal gait.  Skin: Warm, dry without rashes, lesions, ecchymosis.  Neuro: Cranial nerves intact. Normal muscle tone, no cerebellar symptoms. Sensation intact.  Psych: Awake and oriented X 3, normal affect, Insight and Judgment appropriate.     Dan Maker, NP 12:22 PM [facility]

## 2018-04-26 NOTE — Patient Instructions (Addendum)
Use a dropper or use a cap to put peroxide, olive oil,mineral oil or canola oil in the effected ear- 2-3 times a week. Let it soak for 20-30 min then you can take a shower or use a baby bulb with warm water to wash out the ear wax.  Do not use Qtips       Goal is to get your blood pressure <130/80  Start by taking 1/2 tab of new bisoprolol/hydrochlorothiazie - then increase to full tab in 3-4 days if still running above 130/80  Follow up for recheck in 2 weeks or so   Monitor your blood pressure at home, please keep a record and bring that in with you to your next office visit.   Go to the ER if any CP, SOB, nausea, dizziness, severe HA, changes vision/speech  Your most recent BP: BP: (!) 168/96   Take your medications faithfully as instructed. Maintain a healthy weight. Get at least 150 minutes of aerobic exercise per week. Minimize salt intake. Minimize alcohol intake  DASH Eating Plan DASH stands for "Dietary Approaches to Stop Hypertension." The DASH eating plan is a healthy eating plan that has been shown to reduce high blood pressure (hypertension). Additional health benefits may include reducing the risk of type 2 diabetes mellitus, heart disease, and stroke. The DASH eating plan may also help with weight loss. WHAT DO I NEED TO KNOW ABOUT THE DASH EATING PLAN? For the DASH eating plan, you will follow these general guidelines:  Choose foods with a percent daily value for sodium of less than 5% (as listed on the food label).  Use salt-free seasonings or herbs instead of table salt or sea salt.  Check with your health care provider or pharmacist before using salt substitutes.  Eat lower-sodium products, often labeled as "lower sodium" or "no salt added."  Eat fresh foods.  Eat more vegetables, fruits, and low-fat dairy products.  Choose whole grains. Look for the word "whole" as the first word in the ingredient list.  Choose fish and skinless chicken or [place] more  often than red meat. Limit fish, poultry, and meat to 6 oz (170 g) each day.  Limit sweets, desserts, sugars, and sugary drinks.  Choose heart-healthy fats.  Limit cheese to 1 oz (28 g) per day.  Eat more home-cooked food and less restaurant, buffet, and fast food.  Limit fried foods.  Cook foods using methods other than frying.  Limit canned vegetables. If you do use them, rinse them well to decrease the sodium.  When eating at a restaurant, ask that your food be prepared with less salt, or no salt if possible. WHAT FOODS CAN I EAT? Seek help from a dietitian for individual calorie needs. Grains Whole grain or whole wheat bread. Brown rice. Whole grain or whole wheat pasta. Quinoa, bulgur, and whole grain cereals. Low-sodium cereals. Corn or whole wheat flour tortillas. Whole grain cornbread. Whole grain crackers. Low-sodium crackers. Vegetables Fresh or frozen vegetables (raw, steamed, roasted, or grilled). Low-sodium or reduced-sodium tomato and vegetable juices. Low-sodium or reduced-sodium tomato sauce and paste. Low-sodium or reduced-sodium canned vegetables.  Fruits All fresh, canned (in natural juice), or frozen fruits. Meat and Other Protein Products Ground beef (85% or leaner), grass-fed beef, or beef trimmed of fat. Skinless chicken or [place]. Ground chicken or [place]. Pork trimmed of fat. All fish and seafood. Eggs. Dried beans, peas, or lentils. Unsalted nuts and seeds. Unsalted canned beans. Dairy Low-fat dairy products, such as skim or 1%  milk, 2% or reduced-fat cheeses, low-fat ricotta or cottage cheese, or plain low-fat yogurt. Low-sodium or reduced-sodium cheeses. Fats and Oils Tub margarines without trans fats. Light or reduced-fat mayonnaise and salad dressings (reduced sodium). Avocado. Safflower, olive, or canola oils. Natural peanut or almond butter. Other Unsalted popcorn and pretzels. The items listed above may not be a complete list of recommended foods or  beverages. Contact your dietitian for more options. WHAT FOODS ARE NOT RECOMMENDED? Grains White bread. White pasta. White rice. Refined cornbread. Bagels and croissants. Crackers that contain trans fat. Vegetables Creamed or fried vegetables. Vegetables in a cheese sauce. Regular canned vegetables. Regular canned tomato sauce and paste. Regular tomato and vegetable juices. Fruits Dried fruits. Canned fruit in light or heavy syrup. Fruit juice. Meat and Other Protein Products Fatty cuts of meat. Ribs, chicken wings, bacon, sausage, bologna, salami, chitterlings, fatback, hot dogs, bratwurst, and packaged luncheon meats. Salted nuts and seeds. Canned beans with salt. Dairy Whole or 2% milk, cream, half-and-half, and cream cheese. Whole-fat or sweetened yogurt. Full-fat cheeses or blue cheese. Nondairy creamers and whipped toppings. Processed cheese, cheese spreads, or cheese curds. Condiments Onion and garlic salt, seasoned salt, table salt, and sea salt. Canned and packaged gravies. Worcestershire sauce. Tartar sauce. Barbecue sauce. Teriyaki sauce. Soy sauce, including reduced sodium. Steak sauce. Fish sauce. Oyster sauce. Cocktail sauce. Horseradish. Ketchup and mustard. Meat flavorings and tenderizers. Bouillon cubes. Hot sauce. Tabasco sauce. Marinades. Taco seasonings. Relishes. Fats and Oils Butter, stick margarine, lard, shortening, ghee, and bacon fat. Coconut, palm kernel, or palm oils. Regular salad dressings. Other Pickles and olives. Salted popcorn and pretzels. The items listed above may not be a complete list of foods and beverages to avoid. Contact your dietitian for more information. WHERE CAN I FIND MORE INFORMATION? National Heart, Lung, and Blood Institute: [employer] Document Released: 09/10/2011 Document Revised: 02/05/2014 Document Reviewed: 07/26/2013 [facility] Patient Information 2015 [place], [place]. This information is not  intended to replace advice given to you by your health care provider. Make sure you discuss any questions you have with your health care provider.   Bisoprolol; Hydrochlorothiazide, HCTZ tablets What is this medicine? BISOPROLOL; HYDROCHLOROTHIAZIDE (bis OH proe lol; hye droe klor oh THYE a zide) is a combination of a beta-blocker and a diuretic. It is used to treat high blood pressure. This medicine may be used for other purposes; ask your health care provider or pharmacist if you have questions. COMMON BRAND NAME(S): Ziac What should I tell my health care provider before I take this medicine? They need to know if you have any of these conditions: -circulation problems, or blood vessel disease -decreased urine -diabetes -heart disease, heart failure or a history of heart attack -kidney disease -liver disease -lung or breathing disease, like asthma -slow heart rate -thyroid disease -an unusual or allergic reaction to hydrochlorothiazide, bisoprolol, sulfa drugs, other medicines, foods, dyes, or preservatives -pregnant or trying to get pregnant -breast-feeding How should I use this medicine? Take this medicine by mouth with a glass of water. Follow the directions on the prescription label. You can take it with or without food. If it upsets your stomach, take it with food. Take your medicine at regular intervals. Do not take it more often than directed. Do not stop taking except on your doctor's advice. Talk to your pediatrician regarding the use of this medicine in children. Special care may be needed. Overdosage: If you think you have taken too much of this medicine contact a poison control center  or emergency room at once. NOTE: This medicine is only for you. Do not share this medicine with others. What if I miss a dose? If you miss a dose, take it as soon as you can. If it is almost time for your next dose, take only that dose. Do not take double or extra doses. What may interact with  this medicine? -barbiturates, like phenobarbital -cholestyramine -colestipol -corticosteroids, like prednisone -lithium -medicines for chest pain or angina -medicines for diabetes -medicines for high blood pressure or heart failure -medicines to control heart rhythm -NSAIDs, medicines for pain and inflammation, like ibuprofen or naproxen -prescription pain medicines -rifampin -skeletal muscle relaxants like tubocurarine This list may not describe all possible interactions. Give your health care provider a list of all the medicines, herbs, non-prescription drugs, or dietary supplements you use. Also tell them if you smoke, drink alcohol, or use illegal drugs. Some items may interact with your medicine. What should I watch for while using this medicine? Visit your doctor or health care professional for regular checks on your progress. Check your blood pressure as directed. Ask your doctor or health care professional what your blood pressure should be and when you should contact him or her. Check with your doctor or health care professional if you get an attack of severe diarrhea, nausea and vomiting, or if you sweat a lot. The loss of too much body fluid can make it dangerous for you to take this medicine. You may get drowsy or dizzy. Do not drive, use machinery, or do anything that needs mental alertness until you know how this drug affects you. Do not stand or sit up quickly, especially if you are an older patient. This reduces the risk of dizzy or fainting spells. Alcohol can make you more drowsy and dizzy. Avoid alcoholic drinks. This medicine may affect your blood sugar level. If you have diabetes, check with your doctor or health care professional before changing the dose of your diabetic medicine. This medicine can make you more sensitive to the sun. Keep out of the sun. If you cannot avoid being in the sun, wear protective clothing and use sunscreen. Do not use sun lamps or tanning  beds/booths. Do not treat yourself for coughs, colds, or pain while you are taking this medicine without asking your doctor or health care professional for advice. Some ingredients may increase your blood pressure. What side effects may I notice from receiving this medicine? Side effects that you should report to your doctor or health care professional as soon as possible: -allergic reactions like skin rash, itching or hives, swelling of the face, lips, or tongue -breathing problems -changes in vision -chest pain -cold, tingling, or numb hands or feet -eye pain -fast, irregular, or slow heartbeat -increased thirst or sweating -muscle cramps -redness, blistering, peeling or loosening of the skin, including inside the mouth -swollen legs or ankles -tremors -unusual bruising -unusual weak or tired -vomiting -worsened gout pain -yellowing of the eyes or skin Side effects that usually do not require medical attention (report to your doctor or health care professional if they continue or are bothersome): -change in sex drive or performance -cough -depression -diarrhea -nausea This list may not describe all possible side effects. Call your doctor for medical advice about side effects. You may report side effects to FDA at 1-800-FDA-1088. Where should I keep my medicine? Keep out of the reach of children. Store at room temperature between 15 and 30 degrees C (59 and 86 degrees F). Keep  container tightly closed. Throw away any unused medicine after the expiration date. NOTE: This sheet is a summary. It may not cover all possible information. If you have questions about this medicine, talk to your doctor, pharmacist, or health care provider.  2018 Elsevier/Gold Standard (2010-06-11 12:53:54)

## 2018-05-09 ENCOUNTER — Ambulatory Visit: Payer: Self-pay

## 2018-06-23 NOTE — Progress Notes (Signed)
Assessment and Plan:    Hypothyroidism, unspecified type Hypothyroidism-check TSH level, continue medications the same, reminded to take on an empty stomach 30-62mins before food.  -     TSH  ELEVATED BLOOD PRESSURE WITHOUT DIAGNOSIS OF HYPERTENSION - continue medications, DASH diet, exercise and monitor at home. Call if greater than 130/80.  INCREASE ZIAC TO 1 PILL AND MONITOR BP -     CBC with Differential/Platelet -     BASIC METABOLIC PANEL WITH GFR -     Hepatic function panel  Mixed hyperlipidemia -continue medications, check lipids, decrease fatty foods, increase activity.  -     Lipid panel  Vitamin D def - started on 10,000 a day last OV, will recheck   Continue diet and meds as discussed. Further disposition pending results of labs. Future Appointments  Date Time Provider Department Center  12/27/2018  9:00 AM Quentin Mulling, PA-C [facility] None    HPI 62 y.o. female  presents for 3 month follow up with hypertension, hyperlipidemia, prediabetes and vitamin D.  Her blood pressure has been controlled at home, she started on ziac 5mg  in July but she is only on 1/2 pill, today their BP is BP: (!) 140/100 She does workout, walks 2 miles 5 days a week. She denies chest pain, shortness of breath, dizziness.  She is on cholesterol medication and denies myalgias. Her cholesterol is not at goal. The cholesterol last visit was:   Lab Results  Component Value Date   CHOL 257 (H) 12/21/2017   HDL 54 12/21/2017   LDLCALC 150 (H) 12/21/2017   LDLDIRECT 75.3 09/11/2010   TRIG 342 (H) 12/21/2017   CHOLHDL 4.8 12/21/2017   She has been working on diet and exercise for prediabetes, and denies paresthesia of the feet, polydipsia and polyuria. Last A1C in the office was: Lab Results  Component Value Date   HGBA1C 5.4 09/07/2016   Patient is on Vitamin D supplement, 2 5000 IU a day.   Lab Results  Component Value Date   VD25OH 34 12/21/2017       She is on thyroid  medication. Her medication was changed last visit but she states she did not change it, she is on 1 pill daily. Patient denies palpitations, she has had constipation, fatigue.  Lab Results  Component Value Date   TSH 1.73 12/21/2017  .  BMI is Body mass index is 28.59 kg/m., she is working on diet and exercise. Wt Readings from Last 3 Encounters:  06/27/18 188 lb (85.3 kg)  04/26/18 186 lb (84.4 kg)  12/23/17 193 lb 12.8 oz (87.9 kg)    Current Medications:  Current Outpatient Medications on File Prior to Visit  Medication Sig Dispense Refill  . aspirin EC 81 MG tablet Take 81 mg by mouth daily.    . bisoprolol-hydrochlorothiazide (ZIAC) 5-6.25 MG tablet Take 1 tablet by mouth daily. 90 tablet 1  . esomeprazole (NEXIUM) 20 MG capsule Take 20 mg by mouth daily at 12 noon.    . Fluticasone Propionate (FLONASE NA) Place into the nose.    . levothyroxine (SYNTHROID, LEVOTHROID) 112 MCG tablet TAKE 1 TABLET BY MOUTH ONCE DAILY ON AN EMPTY STOMACH 30 TO 60 MINUTES BEFORE A MEAL. 90 tablet 3  . Loratadine (CLARITIN) 10 MG CAPS Take by mouth.    . vitamin E (VITAMIN E) 400 UNIT capsule Take 400 Units by mouth daily.     No current facility-administered medications on file prior to visit.    Medical  History:  Past Medical History:  Diagnosis Date  . GERD (gastroesophageal reflux disease)   . Unspecified hypothyroidism   . Unspecified vitamin D deficiency    Allergies:  Allergies  Allergen Reactions  . Codeine     REACTION: hallucinations  . Mometasone Furoate     REACTION: headache     Review of Systems  Constitutional: Negative for chills, diaphoresis, fever and malaise/fatigue.  HENT: Negative for congestion, ear discharge, ear pain, hearing loss, nosebleeds, sinus pain, sore throat and tinnitus.   Eyes: Negative.   Respiratory: Negative for cough, sputum production, shortness of breath and stridor.   Cardiovascular: Negative.   Gastrointestinal: Negative.   Genitourinary:  Negative.     Family history- Review and unchanged Social history- Review and unchanged Physical Exam: BP (!) 140/100   Pulse 77   Temp (!) 97.3 F (36.3 C)   Resp 12   Ht 5\' 8"  (1.727 m)   Wt 188 lb (85.3 kg)   LMP  (LMP Unknown)   SpO2 98%   BMI 28.59 kg/m  Wt Readings from Last 3 Encounters:  06/27/18 188 lb (85.3 kg)  04/26/18 186 lb (84.4 kg)  12/23/17 193 lb 12.8 oz (87.9 kg)   General Appearance: Well nourished, in no apparent distress. Eyes: PERRLA, EOMs, conjunctiva no swelling or erythema Sinuses: + maxillary tenderness ENT/Mouth: after manual extraction in the office of bilateral cerumen impaction TMs without erythema, bulging. No erythema, swelling, or exudate on post pharynx.  Tonsils not swollen or erythematous. Hearing normal.  Neck: Supple, thyroid normal.  Respiratory: Respiratory effort normal, BS equal bilaterally without rales, rhonchi, wheezing or stridor.  Cardio: RRR with no MRGs. Brisk peripheral pulses without edema.  Abdomen: Soft, + BS.  Non tender, no guarding, rebound, hernias, masses. Lymphatics: Non tender without lymphadenopathy.  Musculoskeletal: Full ROM, 5/5 strength, normal gait.  Skin: Warm, dry without rashes, lesions, ecchymosis.  Neuro: Cranial nerves intact. Normal muscle tone, no cerebellar symptoms. Sensation intact.  Psych: Awake and oriented X 3, normal affect, Insight and Judgment appropriate.    Quentin MullingAmanda Collier 9:38 AM

## 2018-06-27 ENCOUNTER — Encounter: Payer: Self-pay | Admitting: Physician Assistant

## 2018-06-27 ENCOUNTER — Ambulatory Visit: Payer: 59 | Admitting: Physician Assistant

## 2018-06-27 VITALS — BP 140/100 | HR 77 | Temp 97.3°F | Resp 12 | Ht 68.0 in | Wt 188.0 lb

## 2018-06-27 DIAGNOSIS — E039 Hypothyroidism, unspecified: Secondary | ICD-10-CM | POA: Diagnosis not present

## 2018-06-27 DIAGNOSIS — I10 Essential (primary) hypertension: Secondary | ICD-10-CM

## 2018-06-27 DIAGNOSIS — E559 Vitamin D deficiency, unspecified: Secondary | ICD-10-CM

## 2018-06-27 DIAGNOSIS — Z79899 Other long term (current) drug therapy: Secondary | ICD-10-CM | POA: Diagnosis not present

## 2018-06-27 DIAGNOSIS — E782 Mixed hyperlipidemia: Secondary | ICD-10-CM | POA: Diagnosis not present

## 2018-06-27 MED ORDER — BISOPROLOL-HYDROCHLOROTHIAZIDE 5-6.25 MG PO TABS: 1.0000 | ORAL_TABLET | Freq: Every day | ORAL | 1 refills | Status: DC

## 2018-06-27 NOTE — Patient Instructions (Signed)
HYPERTENSION INFORMATION  Monitor your blood pressure at home, please keep a record and bring that in with you to your next office visit.   Go to the ER if any CP, SOB, nausea, dizziness, severe HA, changes vision/speech  Due to a recent study, SPRINT, we have changed our goal for the systolic or top blood pressure number. Ideally we want your top number at 120.  In the [facility] Trial, 5000 people were randomized to a goal BP of 120 and 5000 people were randomized to a goal BP of less than 140. The patients with the goal BP at 120 had LESS DEMENTIA, LESS HEART ATTACKS, AND LESS STROKES, AS WELL AS OVERALL DECREASED MORTALITY OR DEATH RATE.   If you are willing, our goal BP is the top number of 120.  Your most recent BP: BP: (!) 140/100   Take your medications faithfully as instructed. Maintain a healthy weight. Get at least 150 minutes of aerobic exercise per week. Minimize salt intake. Minimize alcohol intake  DASH Eating Plan DASH stands for "Dietary Approaches to Stop Hypertension." The DASH eating plan is a healthy eating plan that has been shown to reduce high blood pressure (hypertension). Additional health benefits may include reducing the risk of type 2 diabetes mellitus, heart disease, and stroke. The DASH eating plan may also help with weight loss. WHAT DO I NEED TO KNOW ABOUT THE DASH EATING PLAN? For the DASH eating plan, you will follow these general guidelines:  Choose foods with a percent daily value for sodium of less than 5% (as listed on the food label).  Use salt-free seasonings or herbs instead of table salt or sea salt.  Check with your health care provider or pharmacist before using salt substitutes.  Eat lower-sodium products, often labeled as "lower sodium" or "no salt added."  Eat fresh foods.  Eat more vegetables, fruits, and low-fat dairy products.  Choose whole grains. Look for the word "whole" as the first word in the ingredient list.  Choose fish and  skinless chicken or [place] more often than red meat. Limit fish, poultry, and meat to 6 oz (170 g) each day.  Limit sweets, desserts, sugars, and sugary drinks.  Choose heart-healthy fats.  Limit cheese to 1 oz (28 g) per day.  Eat more home-cooked food and less restaurant, buffet, and fast food.  Limit fried foods.  Cook foods using methods other than frying.  Limit canned vegetables. If you do use them, rinse them well to decrease the sodium.  When eating at a restaurant, ask that your food be prepared with less salt, or no salt if possible. WHAT FOODS CAN I EAT? Seek help from a dietitian for individual calorie needs. Grains Whole grain or whole wheat bread. Brown rice. Whole grain or whole wheat pasta. Quinoa, bulgur, and whole grain cereals. Low-sodium cereals. Corn or whole wheat flour tortillas. Whole grain cornbread. Whole grain crackers. Low-sodium crackers. Vegetables Fresh or frozen vegetables (raw, steamed, roasted, or grilled). Low-sodium or reduced-sodium tomato and vegetable juices. Low-sodium or reduced-sodium tomato sauce and paste. Low-sodium or reduced-sodium canned vegetables.  Fruits All fresh, canned (in natural juice), or frozen fruits. Meat and Other Protein Products Ground beef (85% or leaner), grass-fed beef, or beef trimmed of fat. Skinless chicken or [place]. Ground chicken or [place]. Pork trimmed of fat. All fish and seafood. Eggs. Dried beans, peas, or lentils. Unsalted nuts and seeds. Unsalted canned beans. Dairy Low-fat dairy products, such as skim or 1% milk, 2% or reduced-fat  cheeses, low-fat ricotta or cottage cheese, or plain low-fat yogurt. Low-sodium or reduced-sodium cheeses. Fats and Oils Tub margarines without trans fats. Light or reduced-fat mayonnaise and salad dressings (reduced sodium). Avocado. Safflower, olive, or canola oils. Natural peanut or almond butter. Other Unsalted popcorn and pretzels. The items listed above may not be a  complete list of recommended foods or beverages. Contact your dietitian for more options. WHAT FOODS ARE NOT RECOMMENDED? Grains White bread. White pasta. White rice. Refined cornbread. Bagels and croissants. Crackers that contain trans fat. Vegetables Creamed or fried vegetables. Vegetables in a cheese sauce. Regular canned vegetables. Regular canned tomato sauce and paste. Regular tomato and vegetable juices. Fruits Dried fruits. Canned fruit in light or heavy syrup. Fruit juice. Meat and Other Protein Products Fatty cuts of meat. Ribs, chicken wings, bacon, sausage, bologna, salami, chitterlings, fatback, hot dogs, bratwurst, and packaged luncheon meats. Salted nuts and seeds. Canned beans with salt. Dairy Whole or 2% milk, cream, half-and-half, and cream cheese. Whole-fat or sweetened yogurt. Full-fat cheeses or blue cheese. Nondairy creamers and whipped toppings. Processed cheese, cheese spreads, or cheese curds. Condiments Onion and garlic salt, seasoned salt, table salt, and sea salt. Canned and packaged gravies. Worcestershire sauce. Tartar sauce. Barbecue sauce. Teriyaki sauce. Soy sauce, including reduced sodium. Steak sauce. Fish sauce. Oyster sauce. Cocktail sauce. Horseradish. Ketchup and mustard. Meat flavorings and tenderizers. Bouillon cubes. Hot sauce. Tabasco sauce. Marinades. Taco seasonings. Relishes. Fats and Oils Butter, stick margarine, lard, shortening, ghee, and bacon fat. Coconut, palm kernel, or palm oils. Regular salad dressings. Other Pickles and olives. Salted popcorn and pretzels. The items listed above may not be a complete list of foods and beverages to avoid. Contact your dietitian for more information. WHERE CAN I FIND MORE INFORMATION? National Heart, Lung, and Blood Institute: CablePromo.itwww.nhlbi.nih.gov/health/health-topics/topics/dash/ Document Released: 09/10/2011 Document Revised: 02/05/2014 Document Reviewed: 07/26/2013 [facility] Patient Information 2015  [place]. This information is not intended to replace advice given to you by your health care provider. Make sure you discuss any questions you have with your health care provider.   Bisoprolol; Hydrochlorothiazide, HCTZ tablets What is this medicine? BISOPROLOL; HYDROCHLOROTHIAZIDE (bis OH proe lol; hye droe klor oh THYE a zide) is a combination of a beta-blocker and a diuretic. It is used to treat high blood pressure. This medicine may be used for other purposes; ask your health care provider or pharmacist if you have questions. COMMON BRAND NAME(S): Ziac What should I tell my health care provider before I take this medicine? They need to know if you have any of these conditions: -circulation problems, or blood vessel disease -decreased urine -diabetes -heart disease, heart failure or a history of heart attack -kidney disease -liver disease -lung or breathing disease, like asthma -slow heart rate -thyroid disease -an unusual or allergic reaction to hydrochlorothiazide, bisoprolol, sulfa drugs, other medicines, foods, dyes, or preservatives -pregnant or trying to get pregnant -breast-feeding How should I use this medicine? Take this medicine by mouth with a glass of water. Follow the directions on the prescription label. You can take it with or without food. If it upsets your stomach, take it with food. Take your medicine at regular intervals. Do not take it more often than directed. Do not stop taking except on your doctor's advice. Talk to your pediatrician regarding the use of this medicine in children. Special care may be needed. Overdosage: If you think you have taken too much of this medicine contact a poison control center or emergency room at  once. NOTE: This medicine is only for you. Do not share this medicine with others. What if I miss a dose? If you miss a dose, take it as soon as you can. If it is almost time for your next dose, take only that dose. Do not take double  or extra doses. What may interact with this medicine? -barbiturates, like phenobarbital -cholestyramine -colestipol -corticosteroids, like prednisone -lithium -medicines for chest pain or angina -medicines for diabetes -medicines for high blood pressure or heart failure -medicines to control heart rhythm -NSAIDs, medicines for pain and inflammation, like ibuprofen or naproxen -prescription pain medicines -rifampin -skeletal muscle relaxants like tubocurarine This list may not describe all possible interactions. Give your health care provider a list of all the medicines, herbs, non-prescription drugs, or dietary supplements you use. Also tell them if you smoke, drink alcohol, or use illegal drugs. Some items may interact with your medicine. What should I watch for while using this medicine? Visit your doctor or health care professional for regular checks on your progress. Check your blood pressure as directed. Ask your doctor or health care professional what your blood pressure should be and when you should contact him or her. Check with your doctor or health care professional if you get an attack of severe diarrhea, nausea and vomiting, or if you sweat a lot. The loss of too much body fluid can make it dangerous for you to take this medicine. You may get drowsy or dizzy. Do not drive, use machinery, or do anything that needs mental alertness until you know how this drug affects you. Do not stand or sit up quickly, especially if you are an older patient. This reduces the risk of dizzy or fainting spells. Alcohol can make you more drowsy and dizzy. Avoid alcoholic drinks. This medicine may affect your blood sugar level. If you have diabetes, check with your doctor or health care professional before changing the dose of your diabetic medicine. This medicine can make you more sensitive to the sun. Keep out of the sun. If you cannot avoid being in the sun, wear protective clothing and use sunscreen.  Do not use sun lamps or tanning beds/booths. Do not treat yourself for coughs, colds, or pain while you are taking this medicine without asking your doctor or health care professional for advice. Some ingredients may increase your blood pressure. What side effects may I notice from receiving this medicine? Side effects that you should report to your doctor or health care professional as soon as possible: -allergic reactions like skin rash, itching or hives, swelling of the face, lips, or tongue -breathing problems -changes in vision -chest pain -cold, tingling, or numb hands or feet -eye pain -fast, irregular, or slow heartbeat -increased thirst or sweating -muscle cramps -redness, blistering, peeling or loosening of the skin, including inside the mouth -swollen legs or ankles -tremors -unusual bruising -unusual weak or tired -vomiting -worsened gout pain -yellowing of the eyes or skin Side effects that usually do not require medical attention (report to your doctor or health care professional if they continue or are bothersome): -change in sex drive or performance -cough -depression -diarrhea -nausea This list may not describe all possible side effects. Call your doctor for medical advice about side effects. You may report side effects to FDA at 1-800-FDA-1088. Where should I keep my medicine? Keep out of the reach of children. Store at room temperature between 15 and 30 degrees C (59 and 86 degrees F). Keep container tightly closed. Throw  away any unused medicine after the expiration date. NOTE: This sheet is a summary. It may not cover all possible information. If you have questions about this medicine, talk to your doctor, pharmacist, or health care provider.  2018 Elsevier/Gold Standard (2010-06-11 12:53:54)

## 2018-06-28 LAB — CBC WITH DIFFERENTIAL/PLATELET
BASOS PCT: 1 %
Basophils Absolute: 61 cells/uL (ref 0–200)
EOS ABS: 110 {cells}/uL (ref 15–500)
EOS PCT: 1.8 %
HCT: 39.4 % (ref 35.0–45.0)
HEMOGLOBIN: 13.6 g/dL (ref 11.7–15.5)
Lymphs Abs: 1806 cells/uL (ref 850–3900)
MCH: 29.8 pg (ref 27.0–33.0)
MCHC: 34.5 g/dL (ref 32.0–36.0)
MCV: 86.2 fL (ref 80.0–100.0)
MONOS PCT: 7.1 %
MPV: 9.1 fL (ref 7.5–12.5)
NEUTROS ABS: 3691 {cells}/uL (ref 1500–7800)
Neutrophils Relative %: 60.5 %
Platelets: 309 10*3/uL (ref 140–400)
RBC: 4.57 10*6/uL (ref 3.80–5.10)
RDW: 13.6 % (ref 11.0–15.0)
Total Lymphocyte: 29.6 %
WBC mixed population: 433 cells/uL (ref 200–950)
WBC: 6.1 10*3/uL (ref 3.8–10.8)

## 2018-06-28 LAB — COMPLETE METABOLIC PANEL WITH GFR
AG Ratio: 1.4 (calc) (ref 1.0–2.5)
ALBUMIN MSPROF: 4.3 g/dL (ref 3.6–5.1)
ALT: 18 U/L (ref 6–29)
AST: 23 U/L (ref 10–35)
Alkaline phosphatase (APISO): 112 U/L (ref 33–130)
BUN: 12 mg/dL (ref 7–25)
CO2: 28 mmol/L (ref 20–32)
Calcium: 9.8 mg/dL (ref 8.6–10.4)
Chloride: 103 mmol/L (ref 98–110)
Creat: 0.75 mg/dL (ref 0.50–0.99)
GFR, EST NON AFRICAN AMERICAN: 85 mL/min/{1.73_m2} (ref >=60)
GFR, Est African American: 98 mL/min/{1.73_m2} (ref >=60)
GLUCOSE: 81 mg/dL (ref 65–99)
Globulin: 3 g/dL (calc) (ref 1.9–3.7)
Potassium: 4.2 mmol/L (ref 3.5–5.3)
Sodium: 139 mmol/L (ref 135–146)
TOTAL PROTEIN: 7.3 g/dL (ref 6.1–8.1)
Total Bilirubin: 0.4 mg/dL (ref 0.2–1.2)

## 2018-06-28 LAB — TSH: TSH: 2.33 mIU/L (ref 0.40–4.50)

## 2018-06-28 LAB — LIPID PANEL
CHOL/HDL RATIO: 4.4 (calc) (ref <5.0)
Cholesterol: 241 mg/dL — ABNORMAL HIGH (ref <200)
HDL: 55 mg/dL (ref >50)
LDL CHOLESTEROL (CALC): 144 mg/dL — AB
Non-HDL Cholesterol (Calc): 186 mg/dL (calc) — ABNORMAL HIGH (ref <130)
TRIGLYCERIDES: 297 mg/dL — AB (ref <150)

## 2018-06-28 LAB — VITAMIN D 25 HYDROXY (VIT D DEFICIENCY, FRACTURES): Vit D, 25-Hydroxy: 50 ng/mL (ref 30–100)

## 2018-09-30 NOTE — Progress Notes (Signed)
Assessment and Plan:   Cough -     DG Chest 2 View; Future -     fluticasone furoate-vilanterol (BREO ELLIPTA) 100-25 MCG/INH AEPB; Inhale 1 puff into the lungs daily. Rinse mouth with water after each use  Left ear pain Left ear impacted cerumen - stop using Qtips, irrigation used in the office without complications, use OTC drops/oil at home to prevent reoccurence   Hypothyroidism, unspecified type Hypothyroidism-check TSH level, continue medications the same, reminded to take on an empty stomach 30-7560mins before food.  -     TSH  ELEVATED BLOOD PRESSURE WITHOUT DIAGNOSIS OF HYPERTENSION - continue medications, DASH diet, exercise and monitor at home. Call if greater than 130/80.  -     CBC with Differential/Platelet -     BASIC METABOLIC PANEL WITH GFR -     Hepatic function panel  Mixed hyperlipidemia -continue medications, check lipids, decrease fatty foods, increase activity.  -     Lipid panel   Continue diet and meds as discussed. Further disposition pending results of labs. Future Appointments  Date Time Provider Department Center  12/27/2018  9:00 AM Quentin Mullingollier, Sosaia Pittinger, PA-C [facility] None    HPI 62 y.o. female  presents for 3 month follow up with hypertension, hyperlipidemia, prediabetes and vitamin D.  Her blood pressure has been controlled at home, today their BP is BP: 130/82 She does workout, walks 2 miles 5 days a week. She denies chest pain, shortness of breath, dizziness.  She is on cholesterol medication and denies myalgias. Her cholesterol is not at goal. The cholesterol last visit was:   Lab Results  Component Value Date   CHOL 241 (H) 06/27/2018   HDL 55 06/27/2018   LDLCALC 144 (H) 06/27/2018   LDLDIRECT 75.3 09/11/2010   TRIG 297 (H) 06/27/2018   CHOLHDL 4.4 06/27/2018   She has been working on diet and exercise for prediabetes, and denies paresthesia of the feet, polydipsia and polyuria. Last A1C in the office was: Lab Results  Component Value  Date   HGBA1C 5.4 09/07/2016   Patient is on Vitamin D supplement, 2 5000 IU a day.   Lab Results  Component Value Date   VD25OH 6150 06/27/2018       She is on thyroid medication. Her medication was changed last visit but she states she did not change it, she is on 1 pill daily. Patient denies palpitations, she has had constipation, fatigue.  Lab Results  Component Value Date   TSH 2.33 06/27/2018  .  BMI is Body mass index is 29.56 kg/m., she is working on diet and exercise. Wt Readings from Last 3 Encounters:  10/03/18 194 lb 6.4 oz (88.2 kg)  06/27/18 188 lb (85.3 kg)  04/26/18 186 lb (84.4 kg)    Current Medications:  Current Outpatient Medications on File Prior to Visit  Medication Sig Dispense Refill  . bisoprolol-hydrochlorothiazide (ZIAC) 5-6.25 MG tablet Take 1 tablet by mouth daily. 90 tablet 1  . cetirizine (ZYRTEC) 10 MG chewable tablet Chew 10 mg by mouth daily.    . cholecalciferol (VITAMIN D) 1000 units tablet Take 1,000 Units by mouth daily.    Marland Kitchen. esomeprazole (NEXIUM) 20 MG capsule Take 20 mg by mouth daily at 12 noon.    . Fluticasone Propionate (FLONASE NA) Place into the nose.    . levothyroxine (SYNTHROID, LEVOTHROID) 112 MCG tablet TAKE 1 TABLET BY MOUTH ONCE DAILY ON AN EMPTY STOMACH 30 TO 60 MINUTES BEFORE A MEAL. 90 tablet  3  . vitamin E (VITAMIN E) 400 UNIT capsule Take 400 Units by mouth daily.    Marland Kitchen. aspirin EC 81 MG tablet Take 81 mg by mouth daily.    . Loratadine (CLARITIN) 10 MG CAPS Take by mouth.     No current facility-administered medications on file prior to visit.    Medical History:  Past Medical History:  Diagnosis Date  . GERD (gastroesophageal reflux disease)   . Unspecified hypothyroidism   . Unspecified vitamin D deficiency    Allergies:  Allergies  Allergen Reactions  . Codeine     REACTION: hallucinations  . Mometasone Furoate     REACTION: headache     Review of Systems  Constitutional: Negative for chills, diaphoresis,  fever and malaise/fatigue.  HENT: Negative for congestion, ear discharge, ear pain, hearing loss, nosebleeds, sinus pain, sore throat and tinnitus.   Eyes: Negative.   Respiratory: Negative for cough, sputum production, shortness of breath and stridor.   Cardiovascular: Negative.   Gastrointestinal: Negative.   Genitourinary: Negative.     Family history- Review and unchanged Social history- Review and unchanged Physical Exam: BP 130/82   Pulse 74   Temp (!) 97.2 F (36.2 C)   Ht 5\' 8"  (1.727 m)   Wt 194 lb 6.4 oz (88.2 kg)   LMP  (LMP Unknown)   SpO2 97%   BMI 29.56 kg/m  Wt Readings from Last 3 Encounters:  10/03/18 194 lb 6.4 oz (88.2 kg)  06/27/18 188 lb (85.3 kg)  04/26/18 186 lb (84.4 kg)   General Appearance: Well nourished, in no apparent distress. Eyes: PERRLA, EOMs, conjunctiva no swelling or erythema Sinuses: + maxillary tenderness ENT/Mouth: after manual extraction in the office of bilateral cerumen impaction TMs without erythema, bulging. No erythema, swelling, or exudate on post pharynx.  Tonsils not swollen or erythematous. Hearing normal.  Neck: Supple, thyroid normal.  Respiratory: Respiratory effort normal, BS equal bilaterally without rales, rhonchi, wheezing or stridor.  Cardio: RRR with no MRGs. Brisk peripheral pulses without edema.  Abdomen: Soft, + BS.  Non tender, no guarding, rebound, hernias, masses. Lymphatics: Non tender without lymphadenopathy.  Musculoskeletal: Full ROM, 5/5 strength, normal gait.  Skin: Warm, dry without rashes, lesions, ecchymosis.  Neuro: Cranial nerves intact. Normal muscle tone, no cerebellar symptoms. Sensation intact.  Psych: Awake and oriented X 3, normal affect, Insight and Judgment appropriate.    Quentin MullingAmanda Trinaty Bundrick 9:45 AM

## 2018-10-03 ENCOUNTER — Ambulatory Visit: Payer: 59 | Admitting: Physician Assistant

## 2018-10-03 ENCOUNTER — Encounter: Payer: Self-pay | Admitting: Physician Assistant

## 2018-10-03 VITALS — BP 130/82 | HR 74 | Temp 97.2°F | Ht 68.0 in | Wt 194.4 lb

## 2018-10-03 DIAGNOSIS — E782 Mixed hyperlipidemia: Secondary | ICD-10-CM

## 2018-10-03 DIAGNOSIS — H6122 Impacted cerumen, left ear: Secondary | ICD-10-CM | POA: Diagnosis not present

## 2018-10-03 DIAGNOSIS — Z79899 Other long term (current) drug therapy: Secondary | ICD-10-CM | POA: Diagnosis not present

## 2018-10-03 DIAGNOSIS — I10 Essential (primary) hypertension: Secondary | ICD-10-CM | POA: Diagnosis not present

## 2018-10-03 DIAGNOSIS — R05 Cough: Secondary | ICD-10-CM

## 2018-10-03 DIAGNOSIS — E039 Hypothyroidism, unspecified: Secondary | ICD-10-CM | POA: Diagnosis not present

## 2018-10-03 DIAGNOSIS — H9202 Otalgia, left ear: Secondary | ICD-10-CM | POA: Diagnosis not present

## 2018-10-03 DIAGNOSIS — R059 Cough, unspecified: Secondary | ICD-10-CM

## 2018-10-03 DIAGNOSIS — Z6829 Body mass index (BMI) 29.0-29.9, adult: Secondary | ICD-10-CM

## 2018-10-03 MED ORDER — FLUTICASONE FUROATE-VILANTEROL 100-25 MCG/INH IN AEPB: 1.0000 | INHALATION_SPRAY | Freq: Every day | RESPIRATORY_TRACT | 0 refills | Status: DC

## 2018-10-03 NOTE — Patient Instructions (Addendum)
INFORMATION ABOUT YOUR STEROID INHALER  Can do steroid inhaler, NEED TO DO DAILY, this is NOT a rescue inhaler so if you are acutely short of breath please use your albuterol or call 911.  Do 1 puff ONCE a day.  Do before you brush your teeth OR wash your mouth afterwards.  IF YOU DO NOT WASH YOUR MOUTH OUT IT CAN CAUSE YEAST Can do 2 tsp vinegar with water and switch to help prevent yeast or help yeast in your mouth.   Go to the ER if any chest pain, shortness of breath, nausea, dizziness, severe HA, changes vision/speech  If you are not feeling better than get a chest x ray   INFORMATION ABOUT YOUR XRAY  [facility] to radiology and give them your name. They will have the order and take you back. You do not any paper work, I should get the result back today or tomorrow. This order is good for a year.   Use a dropper or use a cap to put peroxide, olive oil,mineral oil or canola oil in the effected ear- 2-3 times a week. Let it soak for 20-30 min then you can take a shower or use a baby bulb with warm water to wash out the ear wax.  Do not use Qtips   Google mindful eating and here are some tips and tricks below.   Rate your hunger before you eat on a scale of 1-10, try to eat closer to a 6 or higher. And if you are at below that, why are you eating? Slow down and listen to your body.

## 2018-10-04 LAB — LIPID PANEL
Cholesterol: 282 mg/dL — ABNORMAL HIGH (ref <200)
HDL: 53 mg/dL (ref >50)
LDL Cholesterol (Calc): 179 mg/dL (calc) — ABNORMAL HIGH
NON-HDL CHOLESTEROL (CALC): 229 mg/dL — AB (ref <130)
Total CHOL/HDL Ratio: 5.3 (calc) — ABNORMAL HIGH (ref <5.0)
Triglycerides: 297 mg/dL — ABNORMAL HIGH (ref <150)

## 2018-10-04 LAB — COMPLETE METABOLIC PANEL WITH GFR
AG Ratio: 1.4 (calc) (ref 1.0–2.5)
ALT: 12 U/L (ref 6–29)
AST: 13 U/L (ref 10–35)
Albumin: 4.4 g/dL (ref 3.6–5.1)
Alkaline phosphatase (APISO): 110 U/L (ref 33–130)
BUN: 13 mg/dL (ref 7–25)
CO2: 30 mmol/L (ref 20–32)
Calcium: 9.7 mg/dL (ref 8.6–10.4)
Chloride: 102 mmol/L (ref 98–110)
Creat: 0.92 mg/dL (ref 0.50–0.99)
GFR, EST AFRICAN AMERICAN: 77 mL/min/{1.73_m2} (ref >=60)
GFR, Est Non African American: 66 mL/min/{1.73_m2} (ref >=60)
GLOBULIN: 3.2 g/dL (ref 1.9–3.7)
Glucose, Bld: 86 mg/dL (ref 65–99)
POTASSIUM: 4.4 mmol/L (ref 3.5–5.3)
Sodium: 139 mmol/L (ref 135–146)
Total Bilirubin: 0.4 mg/dL (ref 0.2–1.2)
Total Protein: 7.6 g/dL (ref 6.1–8.1)

## 2018-10-04 LAB — CBC WITH DIFFERENTIAL/PLATELET
ABSOLUTE MONOCYTES: 592 {cells}/uL (ref 200–950)
Basophils Absolute: 81 cells/uL (ref 0–200)
Basophils Relative: 1.1 %
Eosinophils Absolute: 111 cells/uL (ref 15–500)
Eosinophils Relative: 1.5 %
HCT: 41.5 % (ref 35.0–45.0)
Hemoglobin: 13.9 g/dL (ref 11.7–15.5)
Lymphs Abs: 2168 cells/uL (ref 850–3900)
MCH: 29.4 pg (ref 27.0–33.0)
MCHC: 33.5 g/dL (ref 32.0–36.0)
MCV: 87.7 fL (ref 80.0–100.0)
MPV: 9.3 fL (ref 7.5–12.5)
Monocytes Relative: 8 %
Neutro Abs: 4447 cells/uL (ref 1500–7800)
Neutrophils Relative %: 60.1 %
Platelets: 323 10*3/uL (ref 140–400)
RBC: 4.73 10*6/uL (ref 3.80–5.10)
RDW: 13.9 % (ref 11.0–15.0)
Total Lymphocyte: 29.3 %
WBC: 7.4 10*3/uL (ref 3.8–10.8)

## 2018-10-04 LAB — MAGNESIUM: Magnesium: 2.2 mg/dL (ref 1.5–2.5)

## 2018-10-04 LAB — TSH: TSH: 2.31 mIU/L (ref 0.40–4.50)

## 2018-12-19 ENCOUNTER — Other Ambulatory Visit: Payer: Self-pay | Admitting: Physician Assistant

## 2018-12-27 ENCOUNTER — Encounter: Payer: Self-pay | Admitting: Physician Assistant

## 2019-01-26 ENCOUNTER — Telehealth: Payer: Self-pay | Admitting: Cardiology

## 2019-01-26 NOTE — Progress Notes (Signed)
Assessment and Plan:  BMI 29 Continue walking Increase veggies Will print out phentermine for the patient and send it with information to her house.   Hypothyroidism, unspecified type Hypothyroidism-check TSH level, continue medications the same, reminded to take on an empty stomach 30-74mins before food.  ELEVATED BLOOD PRESSURE WITHOUT DIAGNOSIS OF HYPERTENSION - continue medications, DASH diet, exercise and monitor at home. Call if greater than 130/80.   Mixed hyperlipidemia -continue medications, check lipids, decrease fatty foods, increase activity.    Continue diet and meds as discussed. Further disposition pending results of labs. Future Appointments  Date Time Provider Department Center  01/27/2019 11:30 AM Quentin Mulling, PA-C [facility] None    HPI 63 y.o. female  presents for 3 month follow up with hypertension, hyperlipidemia, prediabetes and vitamin D.  Her blood pressure has been controlled at home, today their BP is BP: 135/86 She does workout, walks 1 hour, about 3 hours x march 6th. She denies chest pain, shortness of breath, dizziness.   BMI is There is no height or weight on file to calculate BMI., she is working on diet and exercise. SHE DOES NOT HAVE A SCALE BUT FEELS WITH WALKING SHE HAS LOST WEIGHT but also feels she is eating too much and eating a lot of sweets. .  Wt Readings from Last 3 Encounters:  10/03/18 194 lb 6.4 oz (88.2 kg)  06/27/18 188 lb (85.3 kg)  04/26/18 186 lb (84.4 kg)   She is on cholesterol medication and denies myalgias. Her cholesterol is not at goal. The cholesterol last visit was:   Lab Results  Component Value Date   CHOL 282 (H) 10/03/2018   HDL 53 10/03/2018   LDLCALC 179 (H) 10/03/2018   LDLDIRECT 75.3 09/11/2010   TRIG 297 (H) 10/03/2018   CHOLHDL 5.3 (H) 10/03/2018   She has been working on diet and exercise for prediabetes, and denies paresthesia of the feet, polydipsia and polyuria. Last A1C in the office was: Lab  Results  Component Value Date   HGBA1C 5.4 09/07/2016   Patient is on Vitamin D supplement, 2 5000 IU a day.   Lab Results  Component Value Date   VD25OH 87 06/27/2018       She is on thyroid medication. Her medication was changed last visit but she states she did not change it, she is on 1 pill daily. Patient denies palpitations, she has had constipation, fatigue.  Lab Results  Component Value Date   TSH 2.31 10/03/2018  . Current Medications:  Current Outpatient Medications on File Prior to Visit  Medication Sig Dispense Refill  . aspirin EC 81 MG tablet Take 81 mg by mouth daily.    . bisoprolol-hydrochlorothiazide (ZIAC) 5-6.25 MG tablet Take 1 tablet by mouth daily. 90 tablet 1  . cetirizine (ZYRTEC) 10 MG chewable tablet Chew 10 mg by mouth daily.    . cholecalciferol (VITAMIN D) 1000 units tablet Take 1,000 Units by mouth daily.    Marland Kitchen esomeprazole (NEXIUM) 20 MG capsule Take 20 mg by mouth daily at 12 noon.    . fluticasone furoate-vilanterol (BREO ELLIPTA) 100-25 MCG/INH AEPB Inhale 1 puff into the lungs daily. Rinse mouth with water after each use 1 each 0  . Fluticasone Propionate (FLONASE NA) Place into the nose.    . levothyroxine (SYNTHROID, LEVOTHROID) 112 MCG tablet Take 1 tablet daily on an empty stomach with only water for 30 minutes & no Antacid meds, Calcium or Magnesium for 4 hours & avoid Biotin  30 tablet 0  . Loratadine (CLARITIN) 10 MG CAPS Take by mouth.    . vitamin E (VITAMIN E) 400 UNIT capsule Take 400 Units by mouth daily.     No current facility-administered medications on file prior to visit.    Medical History:  Past Medical History:  Diagnosis Date  . GERD (gastroesophageal reflux disease)   . Unspecified hypothyroidism   . Unspecified vitamin D deficiency    Allergies:  Allergies  Allergen Reactions  . Codeine     REACTION: hallucinations  . Mometasone Furoate     REACTION: headache     Review of Systems  Constitutional: Negative for  chills, diaphoresis, fever and malaise/fatigue.  HENT: Negative for congestion, ear discharge, ear pain, hearing loss, nosebleeds, sinus pain, sore throat and tinnitus.   Eyes: Negative.   Respiratory: Negative for cough, sputum production, shortness of breath and stridor.   Cardiovascular: Negative.   Gastrointestinal: Negative.   Genitourinary: Negative.     Family history- Review and unchanged Social history- Review and unchanged Physical Exam: BP 135/86   LMP  (LMP Unknown)  Wt Readings from Last 3 Encounters:  10/03/18 194 lb 6.4 oz (88.2 kg)  06/27/18 188 lb (85.3 kg)  04/26/18 186 lb (84.4 kg)   General Appearance:Well sounding, in no apparent distress.  ENT/Mouth: No hoarseness, No cough for duration of visit.  Respiratory: completing full sentences without distress, without audible wheeze Neuro: Awake and oriented X 3,  Psych:  Insight and Judgment appropriate.   Quentin MullingAmanda Salil Raineri 11:08 AM

## 2019-01-26 NOTE — Telephone Encounter (Signed)
Called pt to schedule virtual visit and pt stated she did not want to schedule an appt and for me to "take her off the list"

## 2019-01-27 ENCOUNTER — Other Ambulatory Visit: Payer: Self-pay

## 2019-01-27 ENCOUNTER — Ambulatory Visit: Payer: BLUE CROSS/BLUE SHIELD | Admitting: Physician Assistant

## 2019-01-27 ENCOUNTER — Encounter: Payer: Self-pay | Admitting: Physician Assistant

## 2019-01-27 VITALS — BP 135/86

## 2019-01-27 DIAGNOSIS — M797 Fibromyalgia: Secondary | ICD-10-CM

## 2019-01-27 DIAGNOSIS — E559 Vitamin D deficiency, unspecified: Secondary | ICD-10-CM | POA: Diagnosis not present

## 2019-01-27 DIAGNOSIS — Z6829 Body mass index (BMI) 29.0-29.9, adult: Secondary | ICD-10-CM

## 2019-01-27 DIAGNOSIS — E782 Mixed hyperlipidemia: Secondary | ICD-10-CM | POA: Diagnosis not present

## 2019-01-27 DIAGNOSIS — E039 Hypothyroidism, unspecified: Secondary | ICD-10-CM | POA: Diagnosis not present

## 2019-01-27 MED ORDER — PHENTERMINE HCL 37.5 MG PO TABS: 37.5000 mg | ORAL_TABLET | Freq: Every day | ORAL | 2 refills | Status: DC

## 2019-01-27 MED ORDER — BISOPROLOL-HYDROCHLOROTHIAZIDE 5-6.25 MG PO TABS: 1.0000 | ORAL_TABLET | Freq: Every day | ORAL | 1 refills | Status: DC

## 2019-01-27 MED ORDER — LEVOTHYROXINE SODIUM 112 MCG PO TABS: ORAL_TABLET | ORAL | 1 refills | Status: DC

## 2019-01-27 NOTE — Patient Instructions (Signed)
Phentermine  While taking the medication we may ask that you come into the office once a month for the first month for a blood pressure check and EKG.   Also please bring in a food log for that visit to review. It is helpful if you bring in a food diary or use an app on your phone such as myfitnesspal to record your calorie intake, especially in the beginning. BRING FOR YOUR FIRST VISIT.  After that first initial visit, we will want to see you once every 2-3 months to monitor your weight, blood pressure, and heart rate.   In addition we can help answer your questions about diet, exercise, and help you every step of the way with your weight loss journey.  You can start out on 1/3 to 1/2 a pill in the morning and if you are tolerating it well you can increase to one pill daily. I also have some patients that take 1/3 or 1/2 at lunch to help prevent night time eating.  This medication is cheapest CASH pay at [facility] OR [facility] is 14-17 dollars and you do NOT need a membership to get meds from there.   It causes dry mouth and constipation in almost every patient, so try to get 80-100 oz of water a day and increase fiber such as veggies. You can add on a stool softener if you would like.   It can give you energy however it can also cause some people to be shaky, anxious or have palpitations. Stop this medication if that happens and contact the office.   If this medication does not work for you there are several medications that we can try to help rewire your brain in addition to making healthier habits.   What is this medicine? PHENTERMINE (FEN ter meen) decreases your appetite. This medicine is intended to be used in addition to a healthy reduced calorie diet and exercise. The best results are achieved this way. This medicine is only indicated for short-term use. Eventually your weight loss may level out and the medication will no longer be needed.   How should I use this  medicine? Take this medicine by mouth. Follow the directions on the prescription label. The tablets should stay in the bottle until immediately before you take your dose. Take your doses at regular intervals. Do not take your medicine more often than directed.  Overdosage: If you think you have taken too much of this medicine contact a poison control center or emergency room at once. NOTE: This medicine is only for you. Do not share this medicine with others.  What if I miss a dose? If you miss a dose, take it as soon as you can. If it is almost time for your next dose, take only that dose. Do not take double or extra doses. Do not increase or in any way change your dose without consulting your doctor.  What should I watch for while using this medicine? Notify your physician immediately if you become short of breath while doing your normal activities. Do not take this medicine within 6 hours of bedtime. It can keep you from getting to sleep. Avoid drinks that contain caffeine and try to stick to a regular bedtime every night. Do not stand or sit up quickly, especially if you are an older patient. This reduces the risk of dizzy or fainting spells. Avoid alcoholic drinks.  What side effects may I notice from receiving this medicine? Side effects that   you should report to your doctor or health care professional as soon as possible: -chest pain, palpitations -depression or severe changes in mood -increased blood pressure -irritability -nervousness or restlessness -severe dizziness -shortness of breath -problems urinating -unusual swelling of the legs -vomiting  Side effects that usually do not require medical attention (report to your doctor or health care professional if they continue or are bothersome): -blurred vision or other eye problems -changes in sexual ability or desire -constipation or diarrhea -difficulty sleeping -dry mouth or unpleasant taste -headache -nausea This list may  not describe all possible side effects. Call your doctor for medical advice about side effects. You may report side effects to FDA at 1-800-FDA-1088.    

## 2019-01-30 ENCOUNTER — Other Ambulatory Visit: Payer: Self-pay

## 2019-02-06 ENCOUNTER — Telehealth: Payer: Self-pay | Admitting: Physician Assistant

## 2019-02-06 NOTE — Telephone Encounter (Signed)
-----   Message from Gregery Na, CMA sent at 02/06/2019  3:54 PM EDT ----- Regarding: mail Rx Contact: [phone] Per OFFICE note:   Denise Jensen states that you were going to send her a RX( appetite suppressant) but she reports that she has not gotten it yet?

## 2019-02-06 NOTE — Telephone Encounter (Signed)
Phentermine  While taking the medication we may ask that you come into the office once a month for the first month for a blood pressure check and EKG.   Also please bring in a food log for that visit to review. It is helpful if you bring in a food diary or use an app on your phone such as myfitnesspal to record your calorie intake, especially in the beginning. BRING FOR YOUR FIRST VISIT.  After that first initial visit, we will want to see you once every 2-3 months to monitor your weight, blood pressure, and heart rate.   In addition we can help answer your questions about diet, exercise, and help you every step of the way with your weight loss journey.  You can start out on 1/3 to 1/2 a pill in the morning and if you are tolerating it well you can increase to one pill daily. I also have some patients that take 1/3 or 1/2 at lunch to help prevent night time eating.  This medication is cheapest CASH pay at [facility] is 14-17 dollars and you do NOT need a membership to get meds from there.   It causes dry mouth and constipation in almost every patient, so try to get 80-100 oz of water a day and increase fiber such as veggies. You can add on a stool softener if you would like.   It can give you energy however it can also cause some people to be shaky, anxious or have palpitations. Stop this medication if that happens and contact the office.   If this medication does not work for you there are several medications that we can try to help rewire your brain in addition to making healthier habits.   What is this medicine? PHENTERMINE (FEN ter meen) decreases your appetite. This medicine is intended to be used in addition to a healthy reduced calorie diet and exercise. The best results are achieved this way. This medicine is only indicated for short-term use. Eventually your weight loss may level out and the medication will no longer be needed.   How should I use this  medicine? Take this medicine by mouth. Follow the directions on the prescription label. The tablets should stay in the bottle until immediately before you take your dose. Take your doses at regular intervals. Do not take your medicine more often than directed.  Overdosage: If you think you have taken too much of this medicine contact a poison control center or emergency room at once. NOTE: This medicine is only for you. Do not share this medicine with others.  What if I miss a dose? If you miss a dose, take it as soon as you can. If it is almost time for your next dose, take only that dose. Do not take double or extra doses. Do not increase or in any way change your dose without consulting your doctor.  What should I watch for while using this medicine? Notify your physician immediately if you become short of breath while doing your normal activities. Do not take this medicine within 6 hours of bedtime. It can keep you from getting to sleep. Avoid drinks that contain caffeine and try to stick to a regular bedtime every night. Do not stand or sit up quickly, especially if you are an older patient. This reduces the risk of dizzy or fainting spells. Avoid alcoholic drinks.  What side effects may I notice from receiving this medicine? Side effects that   you should report to your doctor or health care professional as soon as possible: -chest pain, palpitations -depression or severe changes in mood -increased blood pressure -irritability -nervousness or restlessness -severe dizziness -shortness of breath -problems urinating -unusual swelling of the legs -vomiting  Side effects that usually do not require medical attention (report to your doctor or health care professional if they continue or are bothersome): -blurred vision or other eye problems -changes in sexual ability or desire -constipation or diarrhea -difficulty sleeping -dry mouth or unpleasant taste -headache -nausea This list may  not describe all possible side effects. Call your doctor for medical advice about side effects. You may report side effects to FDA at 1-800-FDA-1088.    

## 2019-07-25 ENCOUNTER — Other Ambulatory Visit: Payer: Self-pay | Admitting: Physician Assistant

## 2019-07-25 DIAGNOSIS — E039 Hypothyroidism, unspecified: Secondary | ICD-10-CM

## 2019-08-02 NOTE — Progress Notes (Signed)
Complete Physical  Assessment and Plan: LBBB No CP, had normal evaluation at cardiology per patient 01/2018 Will try to do medical management  Hypothyroidism, unspecified type Hypothyroidism-check TSH level, continue medications the same, reminded to take on an empty stomach 30-29mns before food.  -     TSH  ELEVATED BLOOD PRESSURE WITHOUT DIAGNOSIS OF HYPERTENSION - continue medications, DASH diet, exercise and monitor at home. Call if greater than 130/80.  -     CBC with Differential/Platelet -     BASIC METABOLIC PANEL WITH GFR -     Hepatic function panel -     Urinalysis, Routine w reflex microscopic -     Microalbumin / creatinine urine ratio -     EKG 12-Lead  Mixed hyperlipidemia -continue medications, check lipids, decrease fatty foods, increase activity.  Sent in zetia, declines statins -     Lipid panel  Medication management  Vitamin D deficiency -     VITAMIN D 25 Hydroxy (Vit-D Deficiency, Fractures)  Fibromyalgia Try tumeric 8038mID Check ESR/CRP, patient states has seen rheumatology in the past but with pain bilateral shoulders/difficult to lift will rule out PMR at least  DEGENERATION, LUMBAR/LUMBOSACRAL DISC Monitor  Allergic rhinitis due to pollen, unspecified seasonality Allergic rhinitis - Allegra OTC, increase H20, allergy hygiene explained.  BMI 29.0-29.9,adult  Overweight  - long discussion about weight loss, diet, and exercise -recommended diet heavy in fruits and veggies and low in animal meats, cheeses, and dairy products  Fatigue, unspecified type -     Vitamin B12  Screening, anemia, deficiency, iron -     Iron,Total/Total Iron Binding Cap -     Vitamin B12  Cerumen impaction/ bilateral ear pain - stop using Qtips, irrigation used in the office without complications, use OTC drops/oil at home to prevent reoccurence  Discussed med's effects and SE's. Screening labs and tests as requested with regular follow-up as  recommended.  HPI  [age].o. female  presents for a complete physical.  She has had left ear popping/clicking and pain, she has history of vertigo, has only had 1 episode in last year. Zofran helps.   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 Due to her pain she is afraid to take medication.   Her blood pressure has been controlled at home, today their BP is BP: 128/76.  She does not workout. She denies chest pain, shortness of breath, dizziness.  BMI is Body mass index is 30.11 kg/m., she is working on diet and exercise. Wt Readings from Last 3 Encounters:  08/03/19 198 lb (89.8 kg)  10/03/18 194 lb 6.4 oz (88.2 kg)  06/27/18 188 lb (85.3 kg)    She is not on cholesterol medication. Her cholesterol is at goal. The cholesterol last visit was:  Lab Results  Component Value Date   CHOL 282 (H) 10/03/2018   HDL 53 10/03/2018   LDLCALC 179 (H) 10/03/2018   LDLDIRECT 75.3 09/11/2010   TRIG 297 (H) 10/03/2018   CHOLHDL 5.3 (H) 10/03/2018  . Last A1C in the office was:  Lab Results  Component Value Date   HGBA1C 5.4 09/07/2016   Patient is on Vitamin D supplement, she is on 5000 IU daily Lab Results  Component Value Date   VD25OH 50249/23/2019     She has not seen obgyn recently.  Her last pap smear appears to be in 2008.  She has not had one since then, declines today.  She  is on thyroid medication. Her medication was not changed last visit.   Lab Results  Component Value Date   TSH 2.31 10/03/2018   Current Medications:  Current Outpatient Medications on File Prior to Visit  Medication Sig Dispense Refill  . aspirin EC 81 MG tablet Take 81 mg by mouth daily.    . bisoprolol-hydrochlorothiazide (ZIAC) 5-6.25 MG tablet Take 1 tablet by mouth daily. 90 tablet 1  . cetirizine (ZYRTEC) 10 MG chewable tablet Chew 10 mg by mouth daily.    . cholecalciferol (VITAMIN D) 1000 units tablet Take 1,000 Units by mouth daily.    Marland Kitchen  esomeprazole (NEXIUM) 20 MG capsule Take 20 mg by mouth daily at 12 noon.    . fluticasone furoate-vilanterol (BREO ELLIPTA) 100-25 MCG/INH AEPB Inhale 1 puff into the lungs daily. Rinse mouth with water after each use 1 each 0  . Fluticasone Propionate (FLONASE NA) Place into the nose.    . levothyroxine (SYNTHROID) 112 MCG tablet Take 1 tablet daily on an empty stomach with only water for 30 minutes & no Antacid meds, Calcium or Magnesium for 4 hours & avoid Biotin 90 tablet 1  . Loratadine (CLARITIN) 10 MG CAPS Take by mouth.    . phentermine (ADIPEX-P) 37.5 MG tablet Take 1 tablet (37.5 mg total) by mouth daily before breakfast. 30 tablet 2   No current facility-administered medications on file prior to visit.     Health Maintenance:   Immunization History  Administered Date(s) Administered  . Influenza Split 07/10/2014, 08/06/2015  . Influenza Whole 07/06/2007  . Influenza,inj,quad, With Preservative 09/07/2016  . Td 10/05/2001  . Tdap 08/06/2015   Tetanus: 2016 Pneumovax: Not indicated Flu vaccine: 2017 Pap: 2008, declines today but will think about, discussed that if one more was normal would be last one.   MGM:04/2017 Colonoscopy: refused.  Will want to do cologuard when financially feasible, states not covered by insurance.  Last Dental Exam:  2017 Last Eye Exam:  [facility]  Patient Care Team: Unk Pinto, MD as PCP - General (Internal Medicine)  Medical History:  Past Medical History:  Diagnosis Date  . GERD (gastroesophageal reflux disease)   . Unspecified hypothyroidism   . Unspecified vitamin D deficiency    Allergies Allergies  Allergen Reactions  . Codeine     REACTION: hallucinations  . Mometasone Furoate     REACTION: headache    SURGICAL HISTORY She  has no past surgical history on file. FAMILY HISTORY Her family history includes Arthritis in her mother; Breast cancer in her sister; COPD in her mother; Cancer in her sister;  Depression in her mother and sister; Heart disease in her father and sister; Hypertension in her mother; Kidney disease in her mother. SOCIAL HISTORY She  reports that she quit smoking about 37 years ago. She has never used smokeless tobacco. She reports that she does not drink alcohol or use drugs.  Smoked 10-15 years  Review of Systems: Review of Systems  Constitutional: Positive for malaise/fatigue. Negative for chills, diaphoresis, fever and weight loss.  HENT: Negative for congestion, ear pain, hearing loss, sinus pain, sore throat and tinnitus.   Eyes: Negative.   Respiratory: Positive for cough. Negative for shortness of breath and wheezing.   Cardiovascular: Positive for chest pain (has history of costochondritits). Negative for palpitations and leg swelling.  Gastrointestinal: Negative for abdominal pain, blood in stool, constipation, diarrhea, heartburn and melena.  Genitourinary: Negative.   Skin: Negative.   Neurological: Negative for dizziness,  sensory change, loss of consciousness, weakness and headaches.  Psychiatric/Behavioral: Negative for depression. The patient is not nervous/anxious and does not have insomnia.     Physical Exam: Estimated body mass index is 30.11 kg/m as calculated from the following:   Height as of this encounter: _0  (1.727 m).   Weight as of this encounter: 198 lb (89.8 kg). BP 128/76   Pulse 71   Temp (!) 97 F (36.1 C)   Ht _1  (1.727 m)   Wt 198 lb (89.8 kg)   LMP  (LMP Unknown)   SpO2 96%   BMI 30.11 kg/m   General Appearance: Well nourished well developed, in no apparent distress.  Eyes: PERRLA, EOMs, conjunctiva no swelling or erythema ENT/Mouth: Left Ear canals normal without obstruction,Right ear with some canal redness and swelling.  No left ear swelling, erythema, or discharge.  TMs normal bilaterally with no erythema, bulging, retraction, or loss of landmark.  Oropharynx moist and clear with no exudate, erythema, or swelling.    Neck: Supple, thyroid normal. No bruits.  No cervical adenopathy Respiratory: Respiratory effort normal, Breath sounds clear A&P without wheeze, rhonchi, rales.   Cardio: RRR without murmurs, rubs or gallops. Brisk peripheral pulses without edema.  Chest: symmetric, with normal excursions, + pain with palpation Abdomen: Soft, nontender, no guarding, rebound, hernias, masses, or organomegaly.  Lymphatics: Non tender without lymphadenopathy.  Genitourinary: Patient declined pap smear and pelvic exam Musculoskeletal: Full ROM all peripheral extremities,5/5 strength, and normal gait.  Skin: Warm, dry without rashes, lesions, ecchymosis. Neuro: Awake and oriented X 3, Cranial nerves intact, reflexes equal bilaterally. Normal muscle tone, no cerebellar symptoms. Sensation intact.  Psych:  normal affect, Insight and Judgment appropriate.   EKG: NEW LBBB  Over 40 minutes of exam, counseling, chart review and critical decision making was performed  Vicie Mutters 10:05 AM [facility]

## 2019-08-03 ENCOUNTER — Other Ambulatory Visit: Payer: Self-pay

## 2019-08-03 ENCOUNTER — Encounter: Payer: Self-pay | Admitting: Physician Assistant

## 2019-08-03 ENCOUNTER — Ambulatory Visit: Payer: BLUE CROSS/BLUE SHIELD | Admitting: Physician Assistant

## 2019-08-03 VITALS — BP 128/76 | HR 71 | Temp 97.0°F | Ht 68.0 in | Wt 198.0 lb

## 2019-08-03 DIAGNOSIS — E538 Deficiency of other specified B group vitamins: Secondary | ICD-10-CM

## 2019-08-03 DIAGNOSIS — E782 Mixed hyperlipidemia: Secondary | ICD-10-CM

## 2019-08-03 DIAGNOSIS — M5137 Other intervertebral disc degeneration, lumbosacral region: Secondary | ICD-10-CM

## 2019-08-03 DIAGNOSIS — H6122 Impacted cerumen, left ear: Secondary | ICD-10-CM

## 2019-08-03 DIAGNOSIS — E559 Vitamin D deficiency, unspecified: Secondary | ICD-10-CM

## 2019-08-03 DIAGNOSIS — M2550 Pain in unspecified joint: Secondary | ICD-10-CM

## 2019-08-03 DIAGNOSIS — H9202 Otalgia, left ear: Secondary | ICD-10-CM

## 2019-08-03 DIAGNOSIS — Z131 Encounter for screening for diabetes mellitus: Secondary | ICD-10-CM

## 2019-08-03 DIAGNOSIS — I10 Essential (primary) hypertension: Secondary | ICD-10-CM

## 2019-08-03 DIAGNOSIS — Z0001 Encounter for general adult medical examination with abnormal findings: Secondary | ICD-10-CM | POA: Diagnosis not present

## 2019-08-03 DIAGNOSIS — R0602 Shortness of breath: Secondary | ICD-10-CM

## 2019-08-03 DIAGNOSIS — Z1389 Encounter for screening for other disorder: Secondary | ICD-10-CM

## 2019-08-03 DIAGNOSIS — Z136 Encounter for screening for cardiovascular disorders: Secondary | ICD-10-CM

## 2019-08-03 DIAGNOSIS — E039 Hypothyroidism, unspecified: Secondary | ICD-10-CM | POA: Diagnosis not present

## 2019-08-03 DIAGNOSIS — I447 Left bundle-branch block, unspecified: Secondary | ICD-10-CM

## 2019-08-03 DIAGNOSIS — R6889 Other general symptoms and signs: Secondary | ICD-10-CM

## 2019-08-03 DIAGNOSIS — J301 Allergic rhinitis due to pollen: Secondary | ICD-10-CM

## 2019-08-03 DIAGNOSIS — M797 Fibromyalgia: Secondary | ICD-10-CM

## 2019-08-03 DIAGNOSIS — Z79899 Other long term (current) drug therapy: Secondary | ICD-10-CM

## 2019-08-03 MED ORDER — BISOPROLOL-HYDROCHLOROTHIAZIDE 5-6.25 MG PO TABS: 1.0000 | ORAL_TABLET | Freq: Every day | ORAL | 1 refills | Status: DC

## 2019-08-03 MED ORDER — EZETIMIBE 10 MG PO TABS: 10.0000 mg | ORAL_TABLET | Freq: Every day | ORAL | 5 refills | Status: DC

## 2019-08-03 NOTE — Patient Instructions (Addendum)
Your LDL could improve, ideally we want it under a 100, yours last time was 179.  Your LDL is the bad cholesterol that can lead to heart attack and stroke. To lower your number you can decrease your fatty foods, red meat, cheese, milk and increase fiber like whole grains and veggies. You can also add a fiber supplement like Citracel or Benefiber, these do not cause gas and bloating and are safe to use. Especially if you have a strong family history of heart disease or stroke or you have evidence of plaque on any imaging like a chest xray, we may discuss at your next office visit putting you on a medication to get your number below 100.   Use a dropper or use a cap to put peroxide, olive oil,mineral oil or canola oil in the effected ear- 2-3 times a week. Let it soak for 20-30 min then you can take a shower or use a baby bulb with warm water to wash out the ear wax.  Can buy debrox wax removal kit over the counter.  Do not use Qtips    TUMERIC FOR INFLAMMATION  Tumeric with black pepper extract is a great natural antiinflammatory that helps with arthritis and aches and pain. Can get from [employer] or any health food store. Need to take at least 845m twice a day with food TO [place]   INFORMATION ABOUT CBD OIL  The studies for CBD are better for seizure disorders than any other claims for CBD oil.  The biggest issues with it, is CBD oil is NOT regulated. A recent test of 18 over the counter CBD oil showed that 20% had TOO much, 60 % did not have the claimed amount, and 20 % had the claimed amount.   I have had some patients where it interacts with their medications as well and there is not a way for me to check the interactions since it is not a controlled substance.   Found within the liver, the cytochrome P450 enzyme system is responsible for metabolizing potentially toxic compounds, including over 60% percent of any drugs you have consumed.  Cannabidiol can inhibit the cytochrome P450  system's ability to metabolize certain drugs, leading to an overall increase in processing times of your medications.  With any other the counter medication OR supplement, if you try it, try to get from good source and if you have ANY abnormal symptoms over the next 3 months or don't see any benefits from it stop it.   The quality and quanity of the CBD oil varies greatly so I can not endorse patients taking it at this time.

## 2019-08-04 LAB — COMPLETE METABOLIC PANEL WITH GFR
AG Ratio: 1.3 (calc) (ref 1.0–2.5)
ALT: 12 U/L (ref 6–29)
AST: 16 U/L (ref 10–35)
Albumin: 4.4 g/dL (ref 3.6–5.1)
Alkaline phosphatase (APISO): 125 U/L (ref 37–153)
BUN: 13 mg/dL (ref 7–25)
CO2: 30 mmol/L (ref 20–32)
Calcium: 9.8 mg/dL (ref 8.6–10.4)
Chloride: 101 mmol/L (ref 98–110)
Creat: 0.86 mg/dL (ref 0.50–0.99)
GFR, Est African American: 83 mL/min/{1.73_m2} (ref >=60)
GFR, Est Non African American: 71 mL/min/{1.73_m2} (ref >=60)
Globulin: 3.3 g/dL (calc) (ref 1.9–3.7)
Glucose, Bld: 80 mg/dL (ref 65–99)
Potassium: 3.8 mmol/L (ref 3.5–5.3)
Sodium: 139 mmol/L (ref 135–146)
Total Bilirubin: 0.5 mg/dL (ref 0.2–1.2)
Total Protein: 7.7 g/dL (ref 6.1–8.1)

## 2019-08-04 LAB — LIPID PANEL
Cholesterol: 303 mg/dL — ABNORMAL HIGH (ref <200)
HDL: 48 mg/dL — ABNORMAL LOW (ref >=50)
LDL Cholesterol (Calc): 192 mg/dL (calc) — ABNORMAL HIGH
Non-HDL Cholesterol (Calc): 255 mg/dL (calc) — ABNORMAL HIGH (ref <130)
Total CHOL/HDL Ratio: 6.3 (calc) — ABNORMAL HIGH (ref <5.0)
Triglycerides: 370 mg/dL — ABNORMAL HIGH (ref <150)

## 2019-08-04 LAB — MICROALBUMIN / CREATININE URINE RATIO
Creatinine, Urine: 28 mg/dL (ref 20–275)
Microalb Creat Ratio: 7 mcg/mg creat (ref <30)
Microalb, Ur: 0.2 mg/dL

## 2019-08-04 LAB — HEMOGLOBIN A1C
Hgb A1c MFr Bld: 5.4 % of total Hgb (ref <5.7)
Mean Plasma Glucose: 108 (calc)
eAG (mmol/L): 6 (calc)

## 2019-08-04 LAB — CBC WITH DIFFERENTIAL/PLATELET
Absolute Monocytes: 503 cells/uL (ref 200–950)
Basophils Absolute: 74 cells/uL (ref 0–200)
Basophils Relative: 1 %
Eosinophils Absolute: 81 cells/uL (ref 15–500)
Eosinophils Relative: 1.1 %
HCT: 41.7 % (ref 35.0–45.0)
Hemoglobin: 14.2 g/dL (ref 11.7–15.5)
Lymphs Abs: 1902 cells/uL (ref 850–3900)
MCH: 29.9 pg (ref 27.0–33.0)
MCHC: 34.1 g/dL (ref 32.0–36.0)
MCV: 87.8 fL (ref 80.0–100.0)
MPV: 9.4 fL (ref 7.5–12.5)
Monocytes Relative: 6.8 %
Neutro Abs: 4840 cells/uL (ref 1500–7800)
Neutrophils Relative %: 65.4 %
Platelets: 297 10*3/uL (ref 140–400)
RBC: 4.75 10*6/uL (ref 3.80–5.10)
RDW: 13.5 % (ref 11.0–15.0)
Total Lymphocyte: 25.7 %
WBC: 7.4 10*3/uL (ref 3.8–10.8)

## 2019-08-04 LAB — URINALYSIS, ROUTINE W REFLEX MICROSCOPIC
Bilirubin Urine: NEGATIVE
Glucose, UA: NEGATIVE
Hgb urine dipstick: NEGATIVE
Ketones, ur: NEGATIVE
Leukocytes,Ua: NEGATIVE
Nitrite: NEGATIVE
Protein, ur: NEGATIVE
Specific Gravity, Urine: 1.005 (ref 1.001–1.03)
pH: 7 (ref 5.0–8.0)

## 2019-08-04 LAB — VITAMIN D 25 HYDROXY (VIT D DEFICIENCY, FRACTURES): Vit D, 25-Hydroxy: 81 ng/mL (ref 30–100)

## 2019-08-04 LAB — C-REACTIVE PROTEIN: CRP: 2.2 mg/L (ref <8.0)

## 2019-08-04 LAB — TSH: TSH: 0.67 mIU/L (ref 0.40–4.50)

## 2019-08-04 LAB — VITAMIN B12: Vitamin B-12: 338 pg/mL (ref 200–1100)

## 2019-08-04 LAB — SEDIMENTATION RATE: Sed Rate: 19 mm/h (ref 0–30)

## 2019-08-04 LAB — MAGNESIUM: Magnesium: 2.2 mg/dL (ref 1.5–2.5)

## 2019-11-08 ENCOUNTER — Ambulatory Visit: Payer: BC Managed Care – PPO | Admitting: Physician Assistant

## 2019-11-20 ENCOUNTER — Telehealth: Payer: Self-pay

## 2019-11-20 NOTE — Telephone Encounter (Signed)
MANUFACTORY CHANGED----LEVOTHYROXINE  OK TO CHANGE DUE TO THIS ISSUES.  SPOKE WITH STEPHANIE

## 2019-12-14 DIAGNOSIS — M5134 Other intervertebral disc degeneration, thoracic region: Secondary | ICD-10-CM | POA: Diagnosis not present

## 2019-12-14 DIAGNOSIS — M9903 Segmental and somatic dysfunction of lumbar region: Secondary | ICD-10-CM | POA: Diagnosis not present

## 2019-12-14 DIAGNOSIS — M9902 Segmental and somatic dysfunction of thoracic region: Secondary | ICD-10-CM | POA: Diagnosis not present

## 2019-12-14 DIAGNOSIS — M5136 Other intervertebral disc degeneration, lumbar region: Secondary | ICD-10-CM | POA: Diagnosis not present

## 2019-12-20 DIAGNOSIS — M5136 Other intervertebral disc degeneration, lumbar region: Secondary | ICD-10-CM | POA: Diagnosis not present

## 2019-12-20 DIAGNOSIS — M9903 Segmental and somatic dysfunction of lumbar region: Secondary | ICD-10-CM | POA: Diagnosis not present

## 2019-12-20 DIAGNOSIS — M9902 Segmental and somatic dysfunction of thoracic region: Secondary | ICD-10-CM | POA: Diagnosis not present

## 2019-12-20 DIAGNOSIS — M5134 Other intervertebral disc degeneration, thoracic region: Secondary | ICD-10-CM | POA: Diagnosis not present

## 2020-01-04 ENCOUNTER — Other Ambulatory Visit: Payer: Self-pay

## 2020-01-04 ENCOUNTER — Encounter: Payer: Self-pay | Admitting: Physician Assistant

## 2020-01-04 ENCOUNTER — Ambulatory Visit (INDEPENDENT_AMBULATORY_CARE_PROVIDER_SITE_OTHER): Payer: Medicare HMO | Admitting: Physician Assistant

## 2020-01-04 VITALS — BP 140/90 | HR 64 | Temp 97.3°F | Ht 68.0 in | Wt 195.0 lb

## 2020-01-04 DIAGNOSIS — Z0000 Encounter for general adult medical examination without abnormal findings: Secondary | ICD-10-CM

## 2020-01-04 DIAGNOSIS — E782 Mixed hyperlipidemia: Secondary | ICD-10-CM

## 2020-01-04 DIAGNOSIS — Z1211 Encounter for screening for malignant neoplasm of colon: Secondary | ICD-10-CM

## 2020-01-04 DIAGNOSIS — Z0001 Encounter for general adult medical examination with abnormal findings: Secondary | ICD-10-CM | POA: Diagnosis not present

## 2020-01-04 DIAGNOSIS — J301 Allergic rhinitis due to pollen: Secondary | ICD-10-CM

## 2020-01-04 DIAGNOSIS — R0602 Shortness of breath: Secondary | ICD-10-CM

## 2020-01-04 DIAGNOSIS — Z79899 Other long term (current) drug therapy: Secondary | ICD-10-CM | POA: Diagnosis not present

## 2020-01-04 DIAGNOSIS — I10 Essential (primary) hypertension: Secondary | ICD-10-CM | POA: Diagnosis not present

## 2020-01-04 DIAGNOSIS — I447 Left bundle-branch block, unspecified: Secondary | ICD-10-CM

## 2020-01-04 DIAGNOSIS — Z136 Encounter for screening for cardiovascular disorders: Secondary | ICD-10-CM

## 2020-01-04 DIAGNOSIS — E538 Deficiency of other specified B group vitamins: Secondary | ICD-10-CM

## 2020-01-04 DIAGNOSIS — M797 Fibromyalgia: Secondary | ICD-10-CM

## 2020-01-04 DIAGNOSIS — E039 Hypothyroidism, unspecified: Secondary | ICD-10-CM | POA: Diagnosis not present

## 2020-01-04 DIAGNOSIS — R6889 Other general symptoms and signs: Secondary | ICD-10-CM

## 2020-01-04 DIAGNOSIS — Z87891 Personal history of nicotine dependence: Secondary | ICD-10-CM

## 2020-01-04 DIAGNOSIS — E559 Vitamin D deficiency, unspecified: Secondary | ICD-10-CM | POA: Diagnosis not present

## 2020-01-04 DIAGNOSIS — H9202 Otalgia, left ear: Secondary | ICD-10-CM | POA: Diagnosis not present

## 2020-01-04 DIAGNOSIS — H6122 Impacted cerumen, left ear: Secondary | ICD-10-CM | POA: Diagnosis not present

## 2020-01-04 DIAGNOSIS — M5137 Other intervertebral disc degeneration, lumbosacral region: Secondary | ICD-10-CM

## 2020-01-04 DIAGNOSIS — R002 Palpitations: Secondary | ICD-10-CM

## 2020-01-04 LAB — COMPLETE METABOLIC PANEL WITH GFR
AG Ratio: 1.3 (calc) (ref 1.0–2.5)
ALT: 11 U/L (ref 6–29)
AST: 15 U/L (ref 10–35)
Albumin: 4.3 g/dL (ref 3.6–5.1)
Alkaline phosphatase (APISO): 116 U/L (ref 37–153)
BUN: 17 mg/dL (ref 7–25)
CO2: 29 mmol/L (ref 20–32)
Calcium: 9.9 mg/dL (ref 8.6–10.4)
Chloride: 103 mmol/L (ref 98–110)
Creat: 0.87 mg/dL (ref 0.50–0.99)
GFR, Est African American: 81 mL/min/{1.73_m2} (ref >=60)
GFR, Est Non African American: 70 mL/min/{1.73_m2} (ref >=60)
Globulin: 3.2 g/dL (calc) (ref 1.9–3.7)
Glucose, Bld: 92 mg/dL (ref 65–99)
Potassium: 4.5 mmol/L (ref 3.5–5.3)
Sodium: 140 mmol/L (ref 135–146)
Total Bilirubin: 0.4 mg/dL (ref 0.2–1.2)
Total Protein: 7.5 g/dL (ref 6.1–8.1)

## 2020-01-04 LAB — TSH: TSH: 0.9 mIU/L (ref 0.40–4.50)

## 2020-01-04 LAB — CBC WITH DIFFERENTIAL/PLATELET
Absolute Monocytes: 454 cells/uL (ref 200–950)
Basophils Absolute: 83 cells/uL (ref 0–200)
Basophils Relative: 1.3 %
Eosinophils Absolute: 83 cells/uL (ref 15–500)
Eosinophils Relative: 1.3 %
HCT: 42.4 % (ref 35.0–45.0)
Hemoglobin: 14.4 g/dL (ref 11.7–15.5)
Lymphs Abs: 1837 cells/uL (ref 850–3900)
MCH: 30.1 pg (ref 27.0–33.0)
MCHC: 34 g/dL (ref 32.0–36.0)
MCV: 88.7 fL (ref 80.0–100.0)
MPV: 9 fL (ref 7.5–12.5)
Monocytes Relative: 7.1 %
Neutro Abs: 3942 cells/uL (ref 1500–7800)
Neutrophils Relative %: 61.6 %
Platelets: 336 10*3/uL (ref 140–400)
RBC: 4.78 10*6/uL (ref 3.80–5.10)
RDW: 13.6 % (ref 11.0–15.0)
Total Lymphocyte: 28.7 %
WBC: 6.4 10*3/uL (ref 3.8–10.8)

## 2020-01-04 LAB — MAGNESIUM: Magnesium: 2.1 mg/dL (ref 1.5–2.5)

## 2020-01-04 LAB — LIPID PANEL
Cholesterol: 244 mg/dL — ABNORMAL HIGH (ref <200)
HDL: 49 mg/dL — ABNORMAL LOW (ref >=50)
LDL Cholesterol (Calc): 154 mg/dL (calc) — ABNORMAL HIGH
Non-HDL Cholesterol (Calc): 195 mg/dL (calc) — ABNORMAL HIGH (ref <130)
Total CHOL/HDL Ratio: 5 (calc) — ABNORMAL HIGH (ref <5.0)
Triglycerides: 250 mg/dL — ABNORMAL HIGH (ref <150)

## 2020-01-04 LAB — VITAMIN D 25 HYDROXY (VIT D DEFICIENCY, FRACTURES): Vit D, 25-Hydroxy: 93 ng/mL (ref 30–100)

## 2020-01-04 LAB — VITAMIN B12: Vitamin B-12: 497 pg/mL (ref 200–1100)

## 2020-01-04 NOTE — Progress Notes (Signed)
WELCOME TO MEDICARE  Assessment and Plan:   Encounter for Medicare annual wellness exam Will get MGM, will get cologuard Not willing to get PAP Needs pneumonia vaccines but getting COVID vaccines Need DEXA/other HM discuss at CPE PAtient very resistant to tests/medications- resistant to doctors  B12 deficiency -     Vitamin B12  Screening for cardiovascular condition -     [place], RETROPERITNL ABD,  LTD  Screen for colon cancer -     CologuardLBBB No CP, had normal evaluation at cardiology per patient 01/2018 Will try to do medical management  Hypothyroidism, unspecified type Hypothyroidism-check TSH level, continue medications the same, reminded to take on an empty stomach 30-63mins before food.  -     TSH  ELEVATED BLOOD PRESSURE WITHOUT DIAGNOSIS OF HYPERTENSION - continue medications, DASH diet, exercise and monitor at home. Call if greater than 130/80.  -     CBC with Differential/Platelet -     BASIC METABOLIC PANEL WITH GFR -     Hepatic function panel -     Urinalysis, Routine w reflex microscopic -     Microalbumin / creatinine urine ratio -     EKG 12-Lead  Mixed hyperlipidemia -continue medications, check lipids, decrease fatty foods, increase activity.  Start on zetia, declines statins May benefit from cardiac calcium score -     Lipid panel  Medication management  Vitamin D deficiency -     VITAMIN D 25 Hydroxy (Vit-D Deficiency, Fractures)  Fibromyalgia Try tumeric 865m BID Can refer to ortho  DEGENERATION, LUMBAR/LUMBOSACRAL DISC Monitor  Allergic rhinitis due to pollen, unspecified seasonality Allergic rhinitis - Allegra OTC, increase H20, allergy hygiene explained.  BMI 29.0-29.9,adult  Overweight  - long discussion about weight loss, diet, and exercise -recommended diet heavy in fruits and veggies and low in animal meats, cheeses, and dairy products  Left ear pain Left ear impacted cerumen - stop using Qtips, irrigation used in the office  without complications, use OTC drops/oil at home to prevent reoccurence   Discussed med's effects and SE's. Screening labs and tests as requested with regular follow-up as recommended.  MEDICARE WELLNESS OBJECTIVES: Physical activity: Current Exercise Habits: The patient does not participate in regular exercise at present(wants to start) Cardiac risk factors: Cardiac Risk Factors include: advanced age (>3men, >69 women);dyslipidemia;hypertension;sedentary lifestyle;smoking/ tobacco exposure Depression/mood screen:   Depression screen [facility] 2/9 01/04/2020  Decreased Interest 0  Down, Depressed, Hopeless 0  PHQ - 2 Score 0    ADLs:  In your present state of health, do you have any difficulty performing the following activities: 01/04/2020  Hearing? N  Vision? N  Difficulty concentrating or making decisions? N  Walking or climbing stairs? N  Dressing or bathing? N  Doing errands, shopping? N  Some recent data might be hidden     Cognitive Testing  Alert? Yes  Normal Appearance?Yes  Oriented to person? Yes  Place? Yes   Time? Yes  Recall of three objects?  Yes  Can perform simple calculations? Yes  Displays appropriate judgment?Yes  Can read the correct time from a watch face?Yes  EOL planning: Does Patient Have a Medical Advance Directive?: No Would patient like information on creating a medical advance directive?: No - Patient declined   Medicare Attestation I have personally reviewed: The patient's medical and social history Their use of alcohol, tobacco or illicit drugs Their current medications and supplements The patient's functional ability including ADLs,fall risks, home safety risks, cognitive, and hearing and visual impairment  Diet and physical activities Evidence for depression or mood disorders  The patient's weight, height, BMI, and visual acuity have been recorded in the chart.  I have made referrals, counseling, and provided education to the patient based on  review of the above and I have provided the patient with a written personalized care plan for preventive services.     HPI  64 y.o. female  presents for a complete physical.  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 Due to her pain she is afraid to take statin medication.   Her blood pressure has been controlled at home, today their BP is BP: 140/90.  She does not workout. She denies chest pain, shortness of breath, dizziness.  BMI is Body mass index is 29.65 kg/m., she is working on diet and exercise. Wt Readings from Last 3 Encounters:  01/04/20 195 lb (88.5 kg)  08/03/19 198 lb (89.8 kg)  10/03/18 194 lb 6.4 oz (88.2 kg)   She is not on cholesterol medication. Her cholesterol is not at goal, she refuses statins, never started zetia.  The cholesterol last visit was:  Lab Results  Component Value Date   CHOL 303 (H) 08/03/2019   HDL 48 (L) 08/03/2019   LDLCALC 192 (H) 08/03/2019   LDLDIRECT 75.3 09/11/2010   TRIG 370 (H) 08/03/2019   CHOLHDL 6.3 (H) 08/03/2019  . Last A1C in the office was:  Lab Results  Component Value Date   HGBA1C 5.4 08/03/2019   Patient is on Vitamin D supplement, she is on 5000 IU daily Lab Results  Component Value Date   VD25OH 35 08/03/2019     She has not seen obgyn recently.  Her last pap smear appears to be in 2008.  She has not had one since then, declines today.  She is on thyroid medication. Her medication was not changed last visit.   Lab Results  Component Value Date   TSH 0.67 08/03/2019   Current Medications:   Current Outpatient Medications (Endocrine & Metabolic):  .  levothyroxine (SYNTHROID) 112 MCG tablet, Take 1 tablet daily on an empty stomach with only water for 30 minutes & no Antacid meds, Calcium or Magnesium for 4 hours & avoid Biotin  Current Outpatient Medications (Cardiovascular):  .  bisoprolol-hydrochlorothiazide (ZIAC) 5-6.25 MG tablet, Take 1 tablet by mouth daily. Marland Kitchen   ezetimibe (ZETIA) 10 MG tablet, Take 1 tablet (10 mg total) by mouth daily. (Patient not taking: Reported on 01/04/2020)  Current Outpatient Medications (Respiratory):  .  cetirizine (ZYRTEC) 10 MG chewable tablet, Chew 10 mg by mouth daily. .  Fluticasone Propionate (FLONASE NA), Place into the nose. .  Loratadine (CLARITIN) 10 MG CAPS, Take by mouth. .  fluticasone furoate-vilanterol (BREO ELLIPTA) 100-25 MCG/INH AEPB, Inhale 1 puff into the lungs daily. Rinse mouth with water after each use (Patient not taking: Reported on 01/04/2020)  Current Outpatient Medications (Analgesics):  .  aspirin EC 81 MG tablet, Take 81 mg by mouth daily.   Current Outpatient Medications (Other):  .  cholecalciferol (VITAMIN D) 1000 units tablet, Take 1,000 Units by mouth daily. Marland Kitchen  esomeprazole (NEXIUM) 20 MG capsule, Take 20 mg by mouth daily at 12 noon. .  phentermine (ADIPEX-P) 37.5 MG tablet, Take 1 tablet (37.5 mg total) by mouth daily before breakfast. (Patient not taking: Reported on 01/04/2020)  Health Maintenance:   Immunization History  Administered Date(s) Administered  . Influenza Split 07/10/2014, 08/06/2015  . Influenza Whole 07/06/2007  .  Influenza,inj,quad, With Preservative 09/07/2016  . Td 10/05/2001  . Tdap 08/06/2015   Tetanus: 2016 Pneumovax: Not indicated Flu vaccine: 2020 Prevnar 13: due but getting covid vaccines Shingrix: need to check to see if she has had chicken pox Pap: 2008, declines today but will think about, discussed that if one more was normal would be last one.  covid has has first   MGM:04/2017 given number Colonoscopy: refused.  Willing to do cologuard PAP declines Last Dental Exam:  2017 Last Eye Exam:  [facility]  Patient Care Team: Lucky Cowboy, MD as PCP - General (Internal Medicine)  Medical History:  Past Medical History:  Diagnosis Date  . GERD (gastroesophageal reflux disease)   . Unspecified hypothyroidism   . Unspecified vitamin  D deficiency    Allergies Allergies  Allergen Reactions  . Codeine     REACTION: hallucinations  . Mometasone Furoate     REACTION: headache    SURGICAL HISTORY She  has no past surgical history on file. FAMILY HISTORY Her family history includes Arthritis in her mother; Breast cancer in her sister; COPD in her mother; Cancer in her sister; Depression in her mother and sister; Heart disease in her father and sister; Hypertension in her mother; Kidney disease in her mother. SOCIAL HISTORY She  reports that she quit smoking about 38 years ago. She has never used smokeless tobacco. She reports that she does not drink alcohol or use drugs.  Smoked 10-15 years  Review of Systems: Review of Systems  Constitutional: Positive for malaise/fatigue. Negative for chills, diaphoresis, fever and weight loss.  HENT: Positive for ear pain and hearing loss. Negative for congestion, sinus pain, sore throat and tinnitus.   Eyes: Negative.   Respiratory: Negative for cough, shortness of breath and wheezing.   Cardiovascular: Negative for chest pain, palpitations and leg swelling.  Gastrointestinal: Negative for abdominal pain, blood in stool, constipation, diarrhea, heartburn and melena.  Genitourinary: Negative.   Skin: Negative.   Neurological: Negative for dizziness, sensory change, loss of consciousness, weakness and headaches.  Psychiatric/Behavioral: Negative for depression. The patient is not nervous/anxious and does not have insomnia.     Physical Exam: Estimated body mass index is 29.65 kg/m as calculated from the following:   Height as of this encounter: 5\' 8"  (1.727 m).   Weight as of this encounter: 195 lb (88.5 kg). BP 140/90   Pulse 64   Temp (!) 97.3 F (36.3 C)   Ht 5\' 8"  (1.727 m)   Wt 195 lb (88.5 kg)   LMP  (LMP Unknown)   SpO2 97%   BMI 29.65 kg/m   General Appearance: Well nourished well developed, in no apparent distress.  Eyes: PERRLA, EOMs, conjunctiva no  swelling or erythema ENT/Mouth: Left Ear canals normal without obstruction,Right ear with some canal redness and swelling.  No left ear swelling, erythema, or discharge.  TMs normal bilaterally with no erythema, bulging, retraction, or loss of landmark.  Oropharynx moist and clear with no exudate, erythema, or swelling.   Neck: Supple, thyroid normal. No bruits.  No cervical adenopathy Respiratory: Respiratory effort normal, Breath sounds clear A&P without wheeze, rhonchi, rales.   Cardio: RRR without murmurs, rubs or gallops. Brisk peripheral pulses without edema.  Chest: symmetric, with normal excursions Abdomen: Soft, nontender, no guarding, rebound, hernias, masses, or organomegaly.  Lymphatics: Non tender without lymphadenopathy.  Genitourinary: Patient declined pap smear and pelvic exam Musculoskeletal: Full ROM all peripheral extremities,5/5 strength, and normal gait.  Skin: Warm, dry  without rashes, lesions, ecchymosis. Neuro: Awake and oriented X 3, Cranial nerves intact, reflexes equal bilaterally. Normal muscle tone, no cerebellar symptoms. Sensation intact.  Psych:  normal affect, Insight and Judgment appropriate.    Quentin Mulling 10:52 AM [facility]

## 2020-01-04 NOTE — Patient Instructions (Addendum)
HOW TO SCHEDULE A MAMMOGRAM  [facility]  7 a.m.-6:30 p.m., Monday 7 a.m.-5 p.m., Tuesday-Friday Schedule an appointment by calling [phone].  Your LDL is not in range or at goal, goal is less than 70.  Your LDL is the bad cholesterol that can lead to heart attack and stroke. To lower your number you can decrease your fatty foods, red meat, cheese, milk and increase fiber like whole grains and veggies. You can also add a fiber supplement like Citracel or Benefiber, these do not cause gas and bloating and are safe to use. Since you have risk factors that make [place] want your number below 70, we need to adjust or add medications to get your number below goal.    Use a dropper or use a cap to put peroxide, olive oil,mineral oil or canola oil in the effected ear- 2-3 times a week. Let it soak for 20-30 min then you can take a shower or use a baby bulb with warm water to wash out the ear wax.  Can buy debrox wax removal kit over the counter.  Do not use Qtips  COLOGUARD INFORMATION   Cologuard is an easy to use noninvasive colon cancer screening test based on the latest advances in stool DNA science.   Colon cancer is 3rd most diagnosed cancer and 2nd leading cause of death in both men and women 33 years of age and older despite being one of the most preventable and treatable cancers if found early.  4 of out 5 people diagnosed with colon cancer have NO prior family history.  When caught EARLY 90% of colon cancer is curable.   More than 92% of cologuard patients have NO out of pocket cost for screening however only your insurer can confirm how Cologuard would be covered for you. Cologuard has a team of specialist that can help you contact your insurer and ask the right questions. Please call [phone] so they can help.   You will receive a short call from [place] support center at [employer], when you receive a call they will say they  are from [place],  to confirm your mailing address and give you more information.  When they calll you, it will appear on the caller ID as "[employer]" or in some cases only this number will appear, [phone].   [facility] [employer] will ship your collection kit directly to you. You will collect a single stool sample in the privacy of your own home, no special preparation required. You will return the kit via [facility] pre-paid shipping or pick-up, in the same box it arrived in. Then I will contact you to discuss your results after I receive them from the laboratory.   If you have any questions or concerns, [facility] Customer Support Specialist are available 24 hours a day, 7 days a week at [phone] or go to [email or web address].    Ask insurance and pharmacy about shingrix - it is a 2 part shot that we will not be getting in the office.   Suggest getting AFTER covid vaccines, have to wait at least a month This shot can make you feel bad due to such good immune response it can trigger some inflammation so take tylenol or aleve day of or day after and plan on resting.   Can go to [email or web address] for more information  Shingrix Vaccination  Two vaccines are licensed and recommended to prevent shingles in the [place] Zoster vaccine live (  ZVL, Zostavax) has been in use since 2006. Recombinant zoster vaccine (RZV, Shingrix), has been in use since 2017 and is recommended by ACIP as the preferred shingles vaccine.  What Everyone Should Know about Shingles Vaccine (Shingrix) One of the Recommended Vaccines by Disease Shingles vaccination is the only way to protect against shingles and postherpetic neuralgia (PHN), the most common complication from shingles. CDC recommends that healthy adults 50 years and older get two doses of the shingles vaccine called Shingrix (recombinant zoster vaccine), separated by 2 to 6 months, to prevent  shingles and the complications from the disease. Your doctor or pharmacist can give you Shingrix as a shot in your upper arm. Shingrix provides strong protection against shingles and PHN. Two doses of Shingrix is more than 90% effective at preventing shingles and PHN. Protection stays above 85% for at least the first four years after you get vaccinated. Shingrix is the preferred vaccine, over Zostavax (zoster vaccine live), a shingles vaccine in use since 2006. Zostavax may still be used to prevent shingles in healthy adults 60 years and older. For example, you could use Zostavax if a person is allergic to Shingrix, prefers Zostavax, or requests immediate vaccination and Shingrix is unavailable. Who Should Get Shingrix? Healthy adults 50 years and older should get two doses of Shingrix, separated by 2 to 6 months. You should get Shingrix even if in the past you . had shingles  . received Zostavax  . are not sure if you had chickenpox There is no maximum age for getting Shingrix. If you had shingles in the past, you can get Shingrix to help prevent future occurrences of the disease. There is no specific length of time that you need to wait after having shingles before you can receive Shingrix, but generally you should make sure the shingles rash has gone away before getting vaccinated. You can get Shingrix whether or not you remember having had chickenpox in the past. Studies show that more than 99% of Americans 40 years and older have had chickenpox, even if they don't remember having the disease. Chickenpox and shingles are related because they are caused by the same virus (varicella zoster virus). After a person recovers from chickenpox, the virus stays dormant (inactive) in the body. It can reactivate years later and cause shingles. If you had Zostavax in the recent past, you should wait at least eight weeks before getting Shingrix. Talk to your healthcare provider to determine the best time to get  Shingrix. Shingrix is available in [employer] and pharmacies. To find doctor's offices or pharmacies near you that offer the vaccine, visit HealthMap Vaccine FinderExternal. If you have questions about Shingrix, talk with your healthcare provider. Vaccine for Those 80 Years and Older  Shingrix reduces the risk of shingles and PHN by more than 90% in people 67 and older. CDC recommends the vaccine for healthy adults 28 and older.  Who Should Not Get Shingrix? You should not get Shingrix if you: . have ever had a severe allergic reaction to any component of the vaccine or after a dose of Shingrix  . tested negative for immunity to varicella zoster virus. If you test negative, you should get chickenpox vaccine.  . currently have shingles  . currently are pregnant or breastfeeding. Women who are pregnant or breastfeeding should wait to get Shingrix.  Marland Kitchen receive specific antiviral drugs (acyclovir, famciclovir, or valacyclovir) 24 hours before vaccination (avoid use of these antiviral drugs for 14 days after vaccination)- zoster vaccine live  only If you have a minor acute (starts suddenly) illness, such as a cold, you may get Shingrix. But if you have a moderate or severe acute illness, you should usually wait until you recover before getting the vaccine. This includes anyone with a temperature of 101.25F or higher. The side effects of the Shingrix are temporary, and usually last 2 to 3 days. While you may experience pain for a few days after getting Shingrix, the pain will be less severe than having shingles and the complications from the disease. How Well Does Shingrix Work? Two doses of Shingrix provides strong protection against shingles and postherpetic neuralgia (PHN), the most common complication of shingles. . In adults 48 to 65 years old who got two doses, Shingrix was 97% effective in preventing shingles; among adults 70 years and older, Shingrix was 91% effective.  . In adults 16 to 65  years old who got two doses, Shingrix was 91% effective in preventing PHN; among adults 70 years and older, Shingrix was 89% effective. Shingrix protection remained high (more than 85%) in people 70 years and older throughout the four years following vaccination. Since your risk of shingles and PHN increases as you get older, it is important to have strong protection against shingles in your older years. Top of Page  What Are the Possible Side Effects of Shingrix? Studies show that Shingrix is safe. The vaccine helps your body create a strong defense against shingles. As a result, you are likely to have temporary side effects from getting the shots. The side effects may affect your ability to do normal daily activities for 2 to 3 days. Most people got a sore arm with mild or moderate pain after getting Shingrix, and some also had redness and swelling where they got the shot. Some people felt tired, had muscle pain, a headache, shivering, fever, stomach pain, or nausea. About 1 out of 6 people who got Shingrix experienced side effects that prevented them from doing regular activities. Symptoms went away on their own in about 2 to 3 days. Side effects were more common in younger people. You might have a reaction to the first or second dose of Shingrix, or both doses. If you experience side effects, you may choose to take over-the-counter pain medicine such as ibuprofen or acetaminophen. If you experience side effects from Shingrix, you should report them to the Vaccine Adverse Event Reporting System (VAERS). Your doctor might file this report, or you can do it yourself through the VAERS websiteExternal, or by calling [phone]. If you have any questions about side effects from Shingrix, talk with your doctor. The shingles vaccine does not contain thimerosal (a preservative containing mercury). Top of Page  When Should I See a Doctor Because of the Side Effects I Experience From Shingrix? In clinical  trials, Shingrix was not associated with serious adverse events. In fact, serious side effects from vaccines are extremely rare. For example, for every 1 million doses of a vaccine given, only one or two people may have a severe allergic reaction. Signs of an allergic reaction happen within minutes or hours after vaccination and include hives, swelling of the face and throat, difficulty breathing, a fast heartbeat, dizziness, or weakness. If you experience these or any other life-threatening symptoms, see a doctor right away. Shingrix causes a strong response in your immune system, so it may produce short-term side effects more intense than you are used to from other vaccines. These side effects can be uncomfortable, but they are expected  and usually go away on their own in 2 or 3 days. Top of Page  How Can I Pay For Shingrix? There are several ways shingles vaccine may be paid for: Medicare . Medicare Part D plans cover the shingles vaccine, but there may be a cost to you depending on your plan. There may be a copay for the vaccine, or you may need to pay in full then get reimbursed for a certain amount.  . Medicare Part B does not cover the shingles vaccine. Medicaid . Medicaid may or may not cover the vaccine. Contact your insurer to find out. Private health insurance . Many private health insurance plans will cover the vaccine, but there may be a cost to you depending on your plan. Contact your insurer to find out. Vaccine assistance programs . Some pharmaceutical companies provide vaccines to eligible adults who cannot afford them. You may want to check with the vaccine manufacturer, [employer], about Shingrix. If you do not currently have health insurance, learn more about affordable health coverage optionsExternal. To find doctor's offices or pharmacies near you that offer the vaccine, visit HealthMap Vaccine FinderExternal.

## 2020-01-17 ENCOUNTER — Other Ambulatory Visit: Payer: Self-pay | Admitting: Internal Medicine

## 2020-01-17 DIAGNOSIS — E039 Hypothyroidism, unspecified: Secondary | ICD-10-CM

## 2020-01-23 DIAGNOSIS — M5136 Other intervertebral disc degeneration, lumbar region: Secondary | ICD-10-CM | POA: Diagnosis not present

## 2020-01-23 DIAGNOSIS — M9903 Segmental and somatic dysfunction of lumbar region: Secondary | ICD-10-CM | POA: Diagnosis not present

## 2020-01-23 DIAGNOSIS — M9902 Segmental and somatic dysfunction of thoracic region: Secondary | ICD-10-CM | POA: Diagnosis not present

## 2020-01-23 DIAGNOSIS — M5134 Other intervertebral disc degeneration, thoracic region: Secondary | ICD-10-CM | POA: Diagnosis not present

## 2020-02-08 DIAGNOSIS — M9902 Segmental and somatic dysfunction of thoracic region: Secondary | ICD-10-CM | POA: Diagnosis not present

## 2020-02-08 DIAGNOSIS — M5136 Other intervertebral disc degeneration, lumbar region: Secondary | ICD-10-CM | POA: Diagnosis not present

## 2020-02-08 DIAGNOSIS — M9903 Segmental and somatic dysfunction of lumbar region: Secondary | ICD-10-CM | POA: Diagnosis not present

## 2020-02-08 DIAGNOSIS — M5134 Other intervertebral disc degeneration, thoracic region: Secondary | ICD-10-CM | POA: Diagnosis not present

## 2020-02-13 DIAGNOSIS — M5134 Other intervertebral disc degeneration, thoracic region: Secondary | ICD-10-CM | POA: Diagnosis not present

## 2020-02-13 DIAGNOSIS — M5136 Other intervertebral disc degeneration, lumbar region: Secondary | ICD-10-CM | POA: Diagnosis not present

## 2020-02-13 DIAGNOSIS — M9903 Segmental and somatic dysfunction of lumbar region: Secondary | ICD-10-CM | POA: Diagnosis not present

## 2020-02-13 DIAGNOSIS — M9902 Segmental and somatic dysfunction of thoracic region: Secondary | ICD-10-CM | POA: Diagnosis not present

## 2020-03-06 DIAGNOSIS — M9903 Segmental and somatic dysfunction of lumbar region: Secondary | ICD-10-CM | POA: Diagnosis not present

## 2020-03-06 DIAGNOSIS — M9902 Segmental and somatic dysfunction of thoracic region: Secondary | ICD-10-CM | POA: Diagnosis not present

## 2020-03-06 DIAGNOSIS — M5134 Other intervertebral disc degeneration, thoracic region: Secondary | ICD-10-CM | POA: Diagnosis not present

## 2020-03-06 DIAGNOSIS — M5136 Other intervertebral disc degeneration, lumbar region: Secondary | ICD-10-CM | POA: Diagnosis not present

## 2020-03-12 DIAGNOSIS — M5134 Other intervertebral disc degeneration, thoracic region: Secondary | ICD-10-CM | POA: Diagnosis not present

## 2020-03-12 DIAGNOSIS — M9902 Segmental and somatic dysfunction of thoracic region: Secondary | ICD-10-CM | POA: Diagnosis not present

## 2020-03-12 DIAGNOSIS — M9903 Segmental and somatic dysfunction of lumbar region: Secondary | ICD-10-CM | POA: Diagnosis not present

## 2020-03-12 DIAGNOSIS — M5136 Other intervertebral disc degeneration, lumbar region: Secondary | ICD-10-CM | POA: Diagnosis not present

## 2020-03-19 DIAGNOSIS — M5136 Other intervertebral disc degeneration, lumbar region: Secondary | ICD-10-CM | POA: Diagnosis not present

## 2020-03-19 DIAGNOSIS — M9903 Segmental and somatic dysfunction of lumbar region: Secondary | ICD-10-CM | POA: Diagnosis not present

## 2020-03-19 DIAGNOSIS — M9905 Segmental and somatic dysfunction of pelvic region: Secondary | ICD-10-CM | POA: Diagnosis not present

## 2020-03-19 DIAGNOSIS — M9904 Segmental and somatic dysfunction of sacral region: Secondary | ICD-10-CM | POA: Diagnosis not present

## 2020-03-26 DIAGNOSIS — M9905 Segmental and somatic dysfunction of pelvic region: Secondary | ICD-10-CM | POA: Diagnosis not present

## 2020-03-26 DIAGNOSIS — M9904 Segmental and somatic dysfunction of sacral region: Secondary | ICD-10-CM | POA: Diagnosis not present

## 2020-03-26 DIAGNOSIS — M9903 Segmental and somatic dysfunction of lumbar region: Secondary | ICD-10-CM | POA: Diagnosis not present

## 2020-03-26 DIAGNOSIS — M5136 Other intervertebral disc degeneration, lumbar region: Secondary | ICD-10-CM | POA: Diagnosis not present

## 2020-04-02 DIAGNOSIS — M9905 Segmental and somatic dysfunction of pelvic region: Secondary | ICD-10-CM | POA: Diagnosis not present

## 2020-04-02 DIAGNOSIS — M9903 Segmental and somatic dysfunction of lumbar region: Secondary | ICD-10-CM | POA: Diagnosis not present

## 2020-04-02 DIAGNOSIS — M9904 Segmental and somatic dysfunction of sacral region: Secondary | ICD-10-CM | POA: Diagnosis not present

## 2020-04-02 DIAGNOSIS — M5136 Other intervertebral disc degeneration, lumbar region: Secondary | ICD-10-CM | POA: Diagnosis not present

## 2020-04-09 DIAGNOSIS — M5136 Other intervertebral disc degeneration, lumbar region: Secondary | ICD-10-CM | POA: Diagnosis not present

## 2020-04-09 DIAGNOSIS — M9904 Segmental and somatic dysfunction of sacral region: Secondary | ICD-10-CM | POA: Diagnosis not present

## 2020-04-09 DIAGNOSIS — M9905 Segmental and somatic dysfunction of pelvic region: Secondary | ICD-10-CM | POA: Diagnosis not present

## 2020-04-09 DIAGNOSIS — M9903 Segmental and somatic dysfunction of lumbar region: Secondary | ICD-10-CM | POA: Diagnosis not present

## 2020-04-15 ENCOUNTER — Encounter: Payer: Self-pay | Admitting: Internal Medicine

## 2020-04-15 NOTE — Patient Instructions (Signed)

## 2020-04-15 NOTE — Progress Notes (Signed)
History of Present Illness:      This very nice 64 y.o.  MWF presents for 3 month follow up with HTN, HLD, Pre-Diabetes and Vitamin D Deficiency. Patient's GERD is controlled on her meds. Patient spent a long time (crying frequently) describing her unhappy childhood & relationship with her sister and deceased mother as a justification as to why she doesn't like to take medicines, especially Chol meds.        Patient is treated for HTN &  today's BP is not at goal - 154/88.  Patient has had no complaints of any cardiac type chest pain, palpitations, dyspnea / orthopnea / PND, dizziness, claudication, or dependent edema.       Hyperlipidemia is not controlled with diet & she has repeated ly refused to take recommended meds in the past, but today after long discussion seems agreeable to try Zetia. Last Lipids were not at goal:  Lab Results  Component Value Date   CHOL 244 (H) 01/04/2020   HDL 49 (L) 01/04/2020   LDLCALC 154 (H) 01/04/2020   LDLDIRECT 75.3 09/11/2010   TRIG 250 (H) 01/04/2020   CHOLHDL 5.0 (H) 01/04/2020    Also, the patient has moderate Obesity  (BMI 29.65) & is monitored expectantly for glucose intolerance / prediabetes (A1c 5.7% / 2015 and 5.8% / 2016).  She has had no symptoms of reactive hypoglycemia, diabetic polys, paresthesias or visual blurring.  Last A1c was Normal & at goal:  Lab Results  Component Value Date   HGBA1C 5.4 08/03/2019          Patient was dx'd Hypothyroid in 2000 and has been on thyroid replacement since.        Further, the patient also has history of Vitamin D Deficiency ("32" / 2015)  and supplements vitamin D without any suspected side-effects. Last vitamin D was at goal:  Lab Results  Component Value Date   VD25OH 93 01/04/2020    Current Outpatient Medications on File Prior to Visit  Medication Sig  . ZIAC 5-6.25 MG tablet Take 1 tablet by mouth daily.  Marland Kitchen VITAMIN D 1000 units tablet Take 1,000 Units by mouth daily.  Marland Kitchen  esomeprazole  20 MG capsule Take 20 mg by mouth daily at 12 noon.  Marland Kitchen ZETIA 10 MG tablet Not taking  . BREO ELLIPTA 100-25  Inhale 1 puff into the lungs daily  . levothyroxine  112 MCG tablet TAKE 1 TABLET DAILY   . Loratadine10 MG CAPS Take by mouth.  Marland Kitchen NASACORT AQ  Place 1 spray into the nose daily. OTC    Allergies  Allergen Reactions  . Codeine     REACTION: hallucinations  . Mometasone Furoate     REACTION: headache    PMHx:   Past Medical History:  Diagnosis Date  . GERD (gastroesophageal reflux disease)   . Unspecified hypothyroidism   . Unspecified vitamin D deficiency     Immunization History  Administered Date(s) Administered  . Influenza Split 07/10/2014, 08/06/2015  . Influenza Whole 07/06/2007  . Influenza,inj,quad, With Preservative 09/07/2016  . Td 10/05/2001  . Tdap 08/06/2015    History reviewed. No pertinent surgical history.  FHx:    Reviewed / unchanged  SHx:    Reviewed / unchanged   Systems Review:  Constitutional: Denies fever, chills, wt changes, headaches, insomnia, fatigue, night sweats, change in appetite. Eyes: Denies redness, blurred vision, diplopia, discharge, itchy, watery eyes.  ENT: Denies discharge, congestion, post nasal drip,  epistaxis, sore throat, earache, hearing loss, dental pain, tinnitus, vertigo, sinus pain, snoring.  CV: Denies chest pain, palpitations, irregular heartbeat, syncope, dyspnea, diaphoresis, orthopnea, PND, claudication or edema. Respiratory: denies cough, dyspnea, DOE, pleurisy, hoarseness, laryngitis, wheezing.  Gastrointestinal: Denies dysphagia, odynophagia, heartburn, reflux, water brash, abdominal pain or cramps, nausea, vomiting, bloating, diarrhea, constipation, hematemesis, melena, hematochezia  or hemorrhoids. Genitourinary: Denies dysuria, frequency, urgency, nocturia, hesitancy, discharge, hematuria or flank pain. Musculoskeletal: Denies arthralgias, myalgias, stiffness, jt. swelling, pain, limping or  strain/sprain.  Skin: Denies pruritus, rash, hives, warts, acne, eczema or change in skin lesion(s). Neuro: No weakness, tremor, incoordination, spasms, paresthesia or pain. Psychiatric: Denies confusion, memory loss or sensory loss. Endo: Denies change in weight, skin or hair change.  Heme/Lymph: No excessive bleeding, bruising or enlarged lymph nodes.  Physical Exam  BP (!) 154/88   Pulse 76   Temp (!) 97.2 F (36.2 C)   Resp 16   Ht 5' 8.75" (1.746 m)   Wt 192 lb 12.8 oz (87.5 kg)   LMP  (LMP Unknown)   BMI 28.68 kg/m   Appears  over nourished  and in no distress.  Eyes: PERRLA, EOMs, conjunctiva no swelling or erythema. Sinuses: No frontal/maxillary tenderness ENT/Mouth: EAC's clear, TM's nl w/o erythema, bulging. Nares clear w/o erythema, swelling, exudates. Oropharynx clear without erythema or exudates. Oral hygiene is good. Tongue normal, non obstructing. Hearing intact.  Neck: Supple. Thyroid not palpable. Car 2+/2+ without bruits, nodes or JVD. Chest: Respirations nl with BS clear & equal w/o rales, rhonchi, wheezing or stridor.  Cor: Heart sounds normal w/ regular rate and rhythm without sig. murmurs, gallops, clicks or rubs. Peripheral pulses normal and equal  without edema.  Abdomen: Soft & bowel sounds normal. Non-tender w/o guarding, rebound, hernias, masses or organomegaly.  Lymphatics: Unremarkable.  Musculoskeletal: Full ROM all peripheral extremities, joint stability, 5/5 strength and normal gait.  Skin: Warm, dry without exposed rashes, lesions or ecchymosis apparent.  Neuro: Cranial nerves intact, reflexes equal bilaterally. Sensory-motor testing grossly intact. Tendon reflexes grossly intact.  Pysch: Alert & oriented x 3.  Insight and judgement nl & appropriate. No ideations.  Assessment and Plan:  1. Essential hypertension  - Continue medication, monitor blood pressure at home  & call if remains elevated  - Reminder to go to the ER if any CP,  SOB,  nausea, dizziness, severe HA, changes vision/speech.  - CBC with Differential/Platelet - COMPLETE METABOLIC PANEL WITH GFR - Magnesium - TSH  2. Hyperlipidemia, mixed  - New Rx - Zetia 10 mg - Continue diet, exercise,& lifestyle modifications.  - Continue monitor periodic cholesterol/liver & renal functions   - Lipid panel - TSH  3. Abnormal glucose  - Continue diet, exercise  - Lifestyle modifications.  - Monitor appropriate labs.  - Hemoglobin A1c - Insulin, random  4. Vitamin D deficiency  - Continue supplementation.  - VITAMIN D 25 Hydroxy  5. Prediabetes  - Hemoglobin A1c - Insulin, random  6. Hypothyroidism  - TSH  7. Medication management  - CBC with Differential/Platelet - COMPLETE METABOLIC PANEL WITH GFR - Magnesium - Lipid panel - TSH - Hemoglobin A1c - Insulin, random - VITAMIN D 25 Hydroxy        Discussed  regular exercise, BP monitoring, weight control to achieve/maintain BMI less than 25 and discussed med and SE's. Recommended labs to assess and monitor clinical status with further disposition pending results of labs.  I discussed the assessment and treatment plan with the patient.  Patient indicated agreement to try Lexapro 10 mg & ROV 3 weeks to recheck.        The patient was provided an opportunity to ask questions and all were answered. The patient agreed with the plan and demonstrated an understanding of the instructions.  I provided over 30 minutes of exam, counseling, chart review and  complex critical decision making.    Marinus Maw, MD

## 2020-04-16 ENCOUNTER — Ambulatory Visit (INDEPENDENT_AMBULATORY_CARE_PROVIDER_SITE_OTHER): Payer: Medicare HMO | Admitting: Internal Medicine

## 2020-04-16 ENCOUNTER — Other Ambulatory Visit: Payer: Self-pay

## 2020-04-16 VITALS — BP 154/88 | HR 76 | Temp 97.2°F | Resp 16 | Ht 68.75 in | Wt 192.8 lb

## 2020-04-16 DIAGNOSIS — E782 Mixed hyperlipidemia: Secondary | ICD-10-CM

## 2020-04-16 DIAGNOSIS — R7309 Other abnormal glucose: Secondary | ICD-10-CM

## 2020-04-16 DIAGNOSIS — E039 Hypothyroidism, unspecified: Secondary | ICD-10-CM

## 2020-04-16 DIAGNOSIS — E559 Vitamin D deficiency, unspecified: Secondary | ICD-10-CM

## 2020-04-16 DIAGNOSIS — I10 Essential (primary) hypertension: Secondary | ICD-10-CM | POA: Diagnosis not present

## 2020-04-16 DIAGNOSIS — F322 Major depressive disorder, single episode, severe without psychotic features: Secondary | ICD-10-CM

## 2020-04-16 DIAGNOSIS — R69 Illness, unspecified: Secondary | ICD-10-CM | POA: Diagnosis not present

## 2020-04-16 DIAGNOSIS — Z79899 Other long term (current) drug therapy: Secondary | ICD-10-CM

## 2020-04-16 DIAGNOSIS — R7303 Prediabetes: Secondary | ICD-10-CM

## 2020-04-16 MED ORDER — ESCITALOPRAM OXALATE 10 MG PO TABS: ORAL_TABLET | ORAL | 1 refills | Status: DC

## 2020-04-16 MED ORDER — EZETIMIBE 10 MG PO TABS: ORAL_TABLET | ORAL | 1 refills | Status: DC

## 2020-04-17 LAB — COMPLETE METABOLIC PANEL WITH GFR
AG Ratio: 1.4 (calc) (ref 1.0–2.5)
ALT: 17 U/L (ref 6–29)
AST: 17 U/L (ref 10–35)
Albumin: 4.3 g/dL (ref 3.6–5.1)
Alkaline phosphatase (APISO): 119 U/L (ref 37–153)
BUN: 16 mg/dL (ref 7–25)
CO2: 28 mmol/L (ref 20–32)
Calcium: 9.7 mg/dL (ref 8.6–10.4)
Chloride: 101 mmol/L (ref 98–110)
Creat: 0.83 mg/dL (ref 0.50–0.99)
GFR, Est African American: 86 mL/min/{1.73_m2} (ref >=60)
GFR, Est Non African American: 74 mL/min/{1.73_m2} (ref >=60)
Globulin: 3 g/dL (calc) (ref 1.9–3.7)
Glucose, Bld: 90 mg/dL (ref 65–99)
Potassium: 4.7 mmol/L (ref 3.5–5.3)
Sodium: 139 mmol/L (ref 135–146)
Total Bilirubin: 0.4 mg/dL (ref 0.2–1.2)
Total Protein: 7.3 g/dL (ref 6.1–8.1)

## 2020-04-17 LAB — CBC WITH DIFFERENTIAL/PLATELET
Absolute Monocytes: 482 cells/uL (ref 200–950)
Basophils Absolute: 67 cells/uL (ref 0–200)
Basophils Relative: 1 %
Eosinophils Absolute: 87 cells/uL (ref 15–500)
Eosinophils Relative: 1.3 %
HCT: 42.8 % (ref 35.0–45.0)
Hemoglobin: 14.1 g/dL (ref 11.7–15.5)
Lymphs Abs: 1769 cells/uL (ref 850–3900)
MCH: 29.6 pg (ref 27.0–33.0)
MCHC: 32.9 g/dL (ref 32.0–36.0)
MCV: 89.7 fL (ref 80.0–100.0)
MPV: 9.2 fL (ref 7.5–12.5)
Monocytes Relative: 7.2 %
Neutro Abs: 4295 cells/uL (ref 1500–7800)
Neutrophils Relative %: 64.1 %
Platelets: 316 10*3/uL (ref 140–400)
RBC: 4.77 10*6/uL (ref 3.80–5.10)
RDW: 13.3 % (ref 11.0–15.0)
Total Lymphocyte: 26.4 %
WBC: 6.7 10*3/uL (ref 3.8–10.8)

## 2020-04-17 LAB — LIPID PANEL
Cholesterol: 283 mg/dL — ABNORMAL HIGH (ref <200)
HDL: 50 mg/dL (ref >=50)
LDL Cholesterol (Calc): 179 mg/dL (calc) — ABNORMAL HIGH
Non-HDL Cholesterol (Calc): 233 mg/dL (calc) — ABNORMAL HIGH (ref <130)
Total CHOL/HDL Ratio: 5.7 (calc) — ABNORMAL HIGH (ref <5.0)
Triglycerides: 319 mg/dL — ABNORMAL HIGH (ref <150)

## 2020-04-17 LAB — HEMOGLOBIN A1C
Hgb A1c MFr Bld: 5.4 % of total Hgb (ref <5.7)
Mean Plasma Glucose: 108 (calc)
eAG (mmol/L): 6 (calc)

## 2020-04-17 LAB — INSULIN, RANDOM: Insulin: 15.3 u[IU]/mL

## 2020-04-17 LAB — MAGNESIUM: Magnesium: 2 mg/dL (ref 1.5–2.5)

## 2020-04-17 LAB — TSH: TSH: 0.62 mIU/L (ref 0.40–4.50)

## 2020-04-17 LAB — VITAMIN D 25 HYDROXY (VIT D DEFICIENCY, FRACTURES): Vit D, 25-Hydroxy: 97 ng/mL (ref 30–100)

## 2020-04-17 NOTE — Progress Notes (Signed)
============================================================  -   A1c - Normal - Great - No Diabetes  -  All Else - CBC - Kidneys - Electrolytes - Liver - Magnesium & Thyroid    - all  Normal / OK ====================================================   - Except    - Total Chol = 283  Very elevated (Ideal or Goal is less than 180)   - and LDL Chol = 179 - also way too high - (Ideal or Goal = less than 70 )   - So Please start the Zetia &   - Recommend a much stricter low cholesterol diet   - Cholesterol only comes from animal sources  - ie. meat, dairy, egg yolks  - Eat all the vegetables you want.  - Avoid meat, especially red meat - Beef AND Pork .  - Avoid cheese & dairy - milk & ice cream.     - Cheese is the most concentrated form of trans-fats which  is the worst thing to clog up our arteries.   - Veggie cheese is OK which can be found in the fresh  produce section at [facility] or [employer] or [employer] ============================================================

## 2020-05-28 DIAGNOSIS — M9905 Segmental and somatic dysfunction of pelvic region: Secondary | ICD-10-CM | POA: Diagnosis not present

## 2020-05-28 DIAGNOSIS — M5136 Other intervertebral disc degeneration, lumbar region: Secondary | ICD-10-CM | POA: Diagnosis not present

## 2020-05-28 DIAGNOSIS — M9903 Segmental and somatic dysfunction of lumbar region: Secondary | ICD-10-CM | POA: Diagnosis not present

## 2020-05-28 DIAGNOSIS — M9904 Segmental and somatic dysfunction of sacral region: Secondary | ICD-10-CM | POA: Diagnosis not present

## 2020-05-28 NOTE — Progress Notes (Signed)
History of Present Illness:      Patient is a very nice 64 yo MWF returning for 6 week f/u of  Elevated BP ( 154/88) and  she reports her BP's have been up & down.       She was also started on Zetia for her  concern of Statin SE's ( took Zetia, but after 3 weeks she had nausea & diarrhea & stopped & related the Diarrhea resolved  ?  Coincidental ) .    Patient was also started on Lexapro for  Acute Anxiety State & Depression - Filled Rx, but never started).  Still having a lot of stress with property settlement after her mother died - Her sister's husband wants the property as does her daughter.   Medications  .  levothyroxine (SYNTHROID) 112 MCG tablet, TAKE 1 TABLET DAILY   .  bisoprolol-hydrochlorothiazide (ZIAC) 5-6.25 MG tablet, Take 1 tablet daily.  Marland Kitchen  ezetimibe (ZETIA) 10 MG tablet, Take 1 tablet Daily for Cholesterol to Prevent Heart Attacks, strokes  & Dementia (Patient not taking: Reported on 05/29/2020)  .  BREO ELLIPTA 100-25 , Inhale 1 puff into the lungs daily.  Marland Kitchen  XYZAL , Take 1 tablet  Daily. Or  .  Loratadine (CLARITIN) 10 MG CAPS, Take Daily  . NASACORT , Place 1 spray into the nose daily. OTC  .  VITAMIN D , Take 1,000 Units  Daily.  Marland Kitchen  NEXIUM 20 MG capsule, Take 20 mg  daily at 12 noon.  .  escitalopram (LEXAPRO) 10 MG tablet, Take 1 tablet Daily for Mood & Anxiety (Patient not taking: Reported on 05/29/2020)  Problem list She has Hypothyroidism; ALLERGIC RHINITIS, SEASONAL; DEGENERATION, LUMBAR/LUMBOSACRAL DISC; Fibromyalgia; Palpitations; Vitamin D deficiency; Mixed hyperlipidemia; Medication management; Shortness of breath on exertion; White coat syndrome with hypertension; and LBBB (left bundle branch block) on their problem list.   Observations/Objective:   BP (!) 158/94   Pulse 60   Temp (!) 96.9 F (36.1 C)   Resp 16   Ht 5' 8.75" (1.746 m)   Wt 191 lb 12.8 oz (87 kg)   LMP  (LMP Unknown)   BMI 28.53 kg/m   HEENT - WNL. Neck - supple.    Chest - Clear equal BS. Cor - Nl HS. RRR w/o sig MGR. PP 1(+). No edema. MS- FROM w/o deformities.  Gait Nl. Neuro -  Nl w/o focal abnormalities.  Assessment and Plan:  1. Essential hypertension  - olmesartan (BENICAR) 20 MG tablet; Take 1/2 to 1 tablet at Night for BP  Dispense: 90 tablet; & monitor random  BP's thru-out the day  2. Hyperlipidemia, mixed  - rosuvastatin (CRESTOR) 5 MG tablet; Take 1 tablet Daily for Cholesterol  Dispense: 90 tablet; Refill: 1 ( recc to start out with 3 x/week for the 1st month)   3. Acute adjustment disorder with mixed anxiety and depressed mood  - Long discussion to try the low dose Lexapro until she comes in for her CPE Nov 1st w/Amanda      Follow Up Instructions:    I discussed the assessment and treatment plan with the patient. The patient was provided an opportunity to ask questions and all were answered. The patient agreed with the plan and demonstrated an understanding of the instructions.   The patient was advised to call back or seek an in-person evaluation if the symptoms worsen or if the condition fails to improve as anticipated.    Marinus Maw, MD

## 2020-05-29 ENCOUNTER — Other Ambulatory Visit: Payer: Self-pay

## 2020-05-29 ENCOUNTER — Ambulatory Visit (INDEPENDENT_AMBULATORY_CARE_PROVIDER_SITE_OTHER): Payer: Medicare HMO | Admitting: Internal Medicine

## 2020-05-29 ENCOUNTER — Encounter: Payer: Self-pay | Admitting: Internal Medicine

## 2020-05-29 VITALS — BP 158/94 | HR 60 | Temp 96.9°F | Resp 16 | Ht 68.75 in | Wt 191.8 lb

## 2020-05-29 DIAGNOSIS — F419 Anxiety disorder, unspecified: Secondary | ICD-10-CM

## 2020-05-29 DIAGNOSIS — E782 Mixed hyperlipidemia: Secondary | ICD-10-CM

## 2020-05-29 DIAGNOSIS — I10 Essential (primary) hypertension: Secondary | ICD-10-CM | POA: Diagnosis not present

## 2020-05-29 DIAGNOSIS — R69 Illness, unspecified: Secondary | ICD-10-CM | POA: Diagnosis not present

## 2020-05-29 DIAGNOSIS — F4323 Adjustment disorder with mixed anxiety and depressed mood: Secondary | ICD-10-CM | POA: Diagnosis not present

## 2020-05-29 MED ORDER — OLMESARTAN MEDOXOMIL 20 MG PO TABS: ORAL_TABLET | ORAL | 0 refills | Status: DC

## 2020-05-29 MED ORDER — ROSUVASTATIN CALCIUM 5 MG PO TABS: ORAL_TABLET | ORAL | 1 refills | Status: DC

## 2020-05-29 NOTE — Patient Instructions (Addendum)
Hypertension  High blood pressure (hypertension) is when the force of blood pumping through the arteries is too strong. The arteries are the blood vessels that carry blood from the heart throughout the body. Hypertension forces the heart to work harder to pump blood and may cause arteries to become narrow or stiff. Untreated or uncontrolled hypertension can cause a heart attack, heart failure, a stroke, kidney disease, and other problems. A blood pressure reading consists of a higher number over a lower number. Ideally, your blood pressure should be below 120/80. The first ("top") number is called the systolic pressure. It is a measure of the pressure in your arteries as your heart beats. The second ("bottom") number is called the diastolic pressure. It is a measure of the pressure in your arteries as the heart relaxes. What are the causes? The exact cause of this condition is not known. There are some conditions that result in or are related to high blood pressure. What increases the risk? Some risk factors for high blood pressure are under your control. The following factors may make you more likely to develop this condition:  Smoking.  Having type 2 diabetes mellitus, high cholesterol, or both.  Not getting enough exercise or physical activity.  Being overweight.  Having too much fat, sugar, calories, or salt (sodium) in your diet.  Drinking too much alcohol. Some risk factors for high blood pressure may be difficult or impossible to change. Some of these factors include:  Having chronic kidney disease.  Having a family history of high blood pressure.  Age. Risk increases with age.  Race. You may be at higher risk if you are African American.  Gender. Men are at higher risk than women before age 41. After age 32, women are at higher risk than men.  Having obstructive sleep apnea.  Stress. What are the signs or symptoms? High blood pressure may not cause symptoms. Very high  blood pressure (hypertensive crisis) may cause:  Headache.  Anxiety.  Shortness of breath.  Nosebleed.  Nausea and vomiting.  Vision changes.  Severe chest pain.  Seizures. How is this diagnosed? This condition is diagnosed by measuring your blood pressure while you are seated, with your arm resting on a flat surface, your legs uncrossed, and your feet flat on the floor. The cuff of the blood pressure monitor will be placed directly against the skin of your upper arm at the level of your heart. It should be measured at least twice using the same arm. Certain conditions can cause a difference in blood pressure between your right and left arms. Certain factors can cause blood pressure readings to be lower or higher than normal for a short period of time:  When your blood pressure is higher when you are in a health care provider's office than when you are at home, this is called white coat hypertension. Most people with this condition do not need medicines.  When your blood pressure is higher at home than when you are in a health care provider's office, this is called masked hypertension. Most people with this condition may need medicines to control blood pressure. If you have a high blood pressure reading during one visit or you have normal blood pressure with other risk factors, you may be asked to:  Return on a different day to have your blood pressure checked again.  Monitor your blood pressure at home for 1 week or longer. If you are diagnosed with hypertension, you may have other blood or  imaging tests to help your health care provider understand your overall risk for other conditions. How is this treated? This condition is treated by making healthy lifestyle changes, such as eating healthy foods, exercising more, and reducing your alcohol intake. Your health care provider may prescribe medicine if lifestyle changes are not enough to get your blood pressure under control, and  if:  Your systolic blood pressure is above 130.  Your diastolic blood pressure is above 80. Your personal target blood pressure may vary depending on your medical conditions, your age, and other factors. Follow these instructions at home: Eating and drinking   Eat a diet that is high in fiber and potassium, and low in sodium, added sugar, and fat. An example eating plan is called the DASH (Dietary Approaches to Stop Hypertension) diet. To eat this way: ? Eat plenty of fresh fruits and vegetables. Try to fill one half of your plate at each meal with fruits and vegetables. ? Eat whole grains, such as whole-wheat pasta, brown rice, or whole-grain bread. Fill about one fourth of your plate with whole grains. ? Eat or drink low-fat dairy products, such as skim milk or low-fat yogurt. ? Avoid fatty cuts of meat, processed or cured meats, and poultry with skin. Fill about one fourth of your plate with lean proteins, such as fish, chicken without skin, beans, eggs, or tofu. ? Avoid pre-made and processed foods. These tend to be higher in sodium, added sugar, and fat.  Reduce your daily sodium intake. Most people with hypertension should eat less than 1,500 mg of sodium a day.  Do not drink alcohol if: ? Your health care provider tells you not to drink. ? You are pregnant, may be pregnant, or are planning to become pregnant.  If you drink alcohol: ? Limit how much you use to:  0-1 drink a day for women.  0-2 drinks a day for men. ? Be aware of how much alcohol is in your drink. In the [place] one drink equals one 12 oz bottle of beer (355 mL), one 5 oz glass of wine (148 mL), or one 1 oz glass of hard liquor (44 mL). Lifestyle   Work with your health care provider to maintain a healthy body weight or to lose weight. Ask what an ideal weight is for you.  Get at least 30 minutes of exercise most days of the week. Activities may include walking, swimming, or biking.  Include exercise to  strengthen your muscles (resistance exercise), such as Pilates or lifting weights, as part of your weekly exercise routine. Try to do these types of exercises for 30 minutes at least 3 days a week.  Do not use any products that contain nicotine or tobacco, such as cigarettes, e-cigarettes, and chewing tobacco. If you need help quitting, ask your health care provider.  Monitor your blood pressure at home as told by your health care provider.  Keep all follow-up visits as told by your health care provider. This is important. Medicines  Take over-the-counter and prescription medicines only as told by your health care provider. Follow directions carefully. Blood pressure medicines must be taken as prescribed.  Do not skip doses of blood pressure medicine. Doing this puts you at risk for problems and can make the medicine less effective.  Ask your health care provider about side effects or reactions to medicines that you should watch for. Contact a health care provider if you:  Think you are having a reaction to a medicine you   are taking.  Have headaches that keep coming back (recurring).  Feel dizzy.  Have swelling in your ankles.  Have trouble with your vision. Get help right away if you:  Develop a severe headache or confusion.  Have unusual weakness or numbness.  Feel faint.  Have severe pain in your chest or abdomen.  Vomit repeatedly.  Have trouble breathing. Summary  Hypertension is when the force of blood pumping through your arteries is too strong. If this condition is not controlled, it may put you at risk for serious complications.  Your personal target blood pressure may vary depending on your medical conditions, your age, and other factors. For most people, a normal blood pressure is less than 120/80.  Hypertension is treated with lifestyle changes, medicines, or a combination of both. Lifestyle changes include losing weight, eating a healthy, low-sodium diet,  exercising more, and limiting alcohol. This information is not intended to replace advice given to you by your health care provider. Make sure you discuss any questions you have with your health care provider. Document Revised: 06/01/2018 Document Reviewed: 06/01/2018 [employer] Patient Education  2020 [employer]  ++++++++++++++++++++++++++++++++++ Generalized Anxiety Disorder, Adult Generalized anxiety disorder (GAD) is a mental health disorder. People with this condition constantly worry about everyday events. Unlike normal anxiety, worry related to GAD is not triggered by a specific event. These worries also do not fade or get better with time. GAD interferes with life functions, including relationships, work, and school. GAD can vary from mild to severe. People with severe GAD can have intense waves of anxiety with physical symptoms (panic attacks). What are the causes? The exact cause of GAD is not known. What increases the risk? This condition is more likely to develop in:  Women.  People who have a family history of anxiety disorders.  People who are very shy.  People who experience very stressful life events, such as the death of a loved one.  People who have a very stressful family environment. What are the signs or symptoms? People with GAD often worry excessively about many things in their lives, such as their health and family. They may also be overly concerned about:  Doing well at work.  Being on time.  Natural disasters.  Friendships. Physical symptoms of GAD include:  Fatigue.  Muscle tension or having muscle twitches.  Trembling or feeling shaky.  Being easily startled.  Feeling like your heart is pounding or racing.  Feeling out of breath or like you cannot take a deep breath.  Having trouble falling asleep or staying asleep.  Sweating.  Nausea, diarrhea, or irritable bowel syndrome (IBS).  Headaches.  Trouble concentrating or remembering  facts.  Restlessness.  Irritability. How is this diagnosed? Your health care provider can diagnose GAD based on your symptoms and medical history. You will also have a physical exam. The health care provider will ask specific questions about your symptoms, including how severe they are, when they started, and if they come and go. Your health care provider may ask you about your use of alcohol or drugs, including prescription medicines. Your health care provider may refer you to a mental health specialist for further evaluation. Your health care provider will do a thorough examination and may perform additional tests to rule out other possible causes of your symptoms. To be diagnosed with GAD, a person must have anxiety that:  Is out of his or her control.  Affects several different aspects of his or her life, such as work  and relationships.  Causes distress that makes him or her unable to take part in normal activities.  Includes at least three physical symptoms of GAD, such as restlessness, fatigue, trouble concentrating, irritability, muscle tension, or sleep problems. Before your health care provider can confirm a diagnosis of GAD, these symptoms must be present more days than they are not, and they must last for six months or longer. How is this treated? The following therapies are usually used to treat GAD:  Medicine. Antidepressant medicine is usually prescribed for long-term daily control. Antianxiety medicines may be added in severe cases, especially when panic attacks occur.  Talk therapy (psychotherapy). Certain types of talk therapy can be helpful in treating GAD by providing support, education, and guidance. Options include: ? Cognitive behavioral therapy (CBT). People learn coping skills and techniques to ease their anxiety. They learn to identify unrealistic or negative thoughts and behaviors and to replace them with positive ones. ? Acceptance and commitment therapy (ACT).  This treatment teaches people how to be mindful as a way to cope with unwanted thoughts and feelings. ? Biofeedback. This process trains you to manage your body's response (physiological response) through breathing techniques and relaxation methods. You will work with a therapist while machines are used to monitor your physical symptoms.  Stress management techniques. These include yoga, meditation, and exercise. A mental health specialist can help determine which treatment is best for you. Some people see improvement with one type of therapy. However, other people require a combination of therapies. Follow these instructions at home:  Take over-the-counter and prescription medicines only as told by your health care provider.  Try to maintain a normal routine.  Try to anticipate stressful situations and allow extra time to manage them.  Practice any stress management or self-calming techniques as taught by your health care provider.  Do not punish yourself for setbacks or for not making progress.  Try to recognize your accomplishments, even if they are small.  Keep all follow-up visits as told by your health care provider. This is important. Contact a health care provider if:  Your symptoms do not get better.  Your symptoms get worse.  You have signs of depression, such as: ? A persistently sad, cranky, or irritable mood. ? Loss of enjoyment in activities that used to bring you joy. ? Change in weight or eating. ? Changes in sleeping habits. ? Avoiding friends or family members. ? Loss of energy for normal tasks. ? Feelings of guilt or worthlessness. Get help right away if:  You have serious thoughts about hurting yourself or others. If you ever feel like you may hurt yourself or others, or have thoughts about taking your own life, get help right away. You can go to your nearest emergency department or call:  Your local emergency services (911 in the [place]  A suicide crisis  helpline, such as the National Suicide Prevention Lifeline at (650) 218-2293. This is open 24 hours a day. Summary  Generalized anxiety disorder (GAD) is a mental health disorder that involves worry that is not triggered by a specific event.  People with GAD often worry excessively about many things in their lives, such as their health and family.  GAD may cause physical symptoms such as restlessness, trouble concentrating, sleep problems, frequent sweating, nausea, diarrhea, headaches, and trembling or muscle twitching.  A mental health specialist can help determine which treatment is best for you. Some people see improvement with one type of therapy. However, other people require a combination  of therapies. ++++++++++++++++++++++++++++++++++++++++  Major Depressive Disorder  Major depressive disorder (MDD) is a mental health condition. MDD often makes you feel sad, hopeless, or helpless. MDD can also cause symptoms in your body. MDD can affect your:  Work.  School.  Relationships.  Other normal activities. MDD can range from mild to very bad. It may occur once (single episode MDD). It can also occur many times (recurrent MDD). The main symptoms of MDD often include:  Feeling sad, depressed, or irritable most of the time.  Loss of interest. MDD symptoms also include:  Sleeping too much or too little.  Eating too much or too little.  A change in your weight.  Feeling tired (fatigue) or having low energy.  Feeling worthless.  Feeling guilty.  Trouble making decisions.  Trouble thinking clearly.  Thoughts of suicide or harming others.  Feeling weak.  Feeling agitated.  Keeping yourself from being around other people (isolation). Follow these instructions at home: Activity  Do these things as told by your doctor: ? Go back to your normal activities. ? Exercise regularly. ? Spend time outdoors. Alcohol  Talk with your doctor about how alcohol can affect your  antidepressant medicines.  Do not drink alcohol. Or, limit how much alcohol you drink. ? This means no more than 1 drink a day for nonpregnant women and 2 drinks a day for men. One drink equals one of these:  12 oz of beer.  5 oz of wine.  1 oz of hard liquor. General instructions  Take over-the-counter and prescription medicines only as told by your doctor.  Eat a healthy diet.  Get plenty of sleep.  Find activities that you enjoy. Make time to do them.  Think about joining a support group. Your doctor may be able to suggest a group for you.  Keep all follow-up visits as told by your doctor. This is important. Where to find more information:  [employer] Mental Illness: ? www.nami.org  [place] [employer] of Mental Health: ? http://www.maynard.net/  National Suicide Prevention Lifeline: ? (417)280-9610. This is free, 24-hour help. Contact a doctor if:  Your symptoms get worse.  You have new symptoms. Get help right away if:  You self-harm.  You see, hear, taste, smell, or feel things that are not present (hallucinate). If you ever feel like you may hurt yourself or others, or have thoughts about taking your own life, get help right away. You can go to your nearest emergency department or call:  Your local emergency services (911 in the [place]  A suicide crisis helpline, such as the National Suicide Prevention Lifeline: ? 205-338-2955. This is open 24 hours a day.

## 2020-06-04 DIAGNOSIS — M9903 Segmental and somatic dysfunction of lumbar region: Secondary | ICD-10-CM | POA: Diagnosis not present

## 2020-06-04 DIAGNOSIS — M9905 Segmental and somatic dysfunction of pelvic region: Secondary | ICD-10-CM | POA: Diagnosis not present

## 2020-06-04 DIAGNOSIS — M9904 Segmental and somatic dysfunction of sacral region: Secondary | ICD-10-CM | POA: Diagnosis not present

## 2020-06-04 DIAGNOSIS — M5136 Other intervertebral disc degeneration, lumbar region: Secondary | ICD-10-CM | POA: Diagnosis not present

## 2020-06-05 ENCOUNTER — Other Ambulatory Visit: Payer: Self-pay | Admitting: Physician Assistant

## 2020-06-11 DIAGNOSIS — M9904 Segmental and somatic dysfunction of sacral region: Secondary | ICD-10-CM | POA: Diagnosis not present

## 2020-06-11 DIAGNOSIS — M5136 Other intervertebral disc degeneration, lumbar region: Secondary | ICD-10-CM | POA: Diagnosis not present

## 2020-06-11 DIAGNOSIS — M9903 Segmental and somatic dysfunction of lumbar region: Secondary | ICD-10-CM | POA: Diagnosis not present

## 2020-06-11 DIAGNOSIS — M9905 Segmental and somatic dysfunction of pelvic region: Secondary | ICD-10-CM | POA: Diagnosis not present

## 2020-06-18 DIAGNOSIS — M9904 Segmental and somatic dysfunction of sacral region: Secondary | ICD-10-CM | POA: Diagnosis not present

## 2020-06-18 DIAGNOSIS — M5136 Other intervertebral disc degeneration, lumbar region: Secondary | ICD-10-CM | POA: Diagnosis not present

## 2020-06-18 DIAGNOSIS — M9903 Segmental and somatic dysfunction of lumbar region: Secondary | ICD-10-CM | POA: Diagnosis not present

## 2020-06-18 DIAGNOSIS — M9905 Segmental and somatic dysfunction of pelvic region: Secondary | ICD-10-CM | POA: Diagnosis not present

## 2020-07-11 DIAGNOSIS — M9903 Segmental and somatic dysfunction of lumbar region: Secondary | ICD-10-CM | POA: Diagnosis not present

## 2020-07-11 DIAGNOSIS — M9904 Segmental and somatic dysfunction of sacral region: Secondary | ICD-10-CM | POA: Diagnosis not present

## 2020-07-11 DIAGNOSIS — M5136 Other intervertebral disc degeneration, lumbar region: Secondary | ICD-10-CM | POA: Diagnosis not present

## 2020-07-11 DIAGNOSIS — M9905 Segmental and somatic dysfunction of pelvic region: Secondary | ICD-10-CM | POA: Diagnosis not present

## 2020-07-16 ENCOUNTER — Other Ambulatory Visit: Payer: Self-pay | Admitting: Internal Medicine

## 2020-07-16 DIAGNOSIS — E039 Hypothyroidism, unspecified: Secondary | ICD-10-CM

## 2020-07-24 DIAGNOSIS — M9905 Segmental and somatic dysfunction of pelvic region: Secondary | ICD-10-CM | POA: Diagnosis not present

## 2020-07-24 DIAGNOSIS — M9903 Segmental and somatic dysfunction of lumbar region: Secondary | ICD-10-CM | POA: Diagnosis not present

## 2020-07-24 DIAGNOSIS — M9904 Segmental and somatic dysfunction of sacral region: Secondary | ICD-10-CM | POA: Diagnosis not present

## 2020-07-24 DIAGNOSIS — M5136 Other intervertebral disc degeneration, lumbar region: Secondary | ICD-10-CM | POA: Diagnosis not present

## 2020-08-05 ENCOUNTER — Ambulatory Visit (INDEPENDENT_AMBULATORY_CARE_PROVIDER_SITE_OTHER): Payer: Medicare HMO | Admitting: Adult Health Nurse Practitioner

## 2020-08-05 ENCOUNTER — Encounter: Payer: Self-pay | Admitting: Adult Health Nurse Practitioner

## 2020-08-05 ENCOUNTER — Other Ambulatory Visit: Payer: Self-pay

## 2020-08-05 VITALS — BP 132/76 | HR 81 | Temp 98.1°F | Ht 68.0 in | Wt 195.0 lb

## 2020-08-05 DIAGNOSIS — E538 Deficiency of other specified B group vitamins: Secondary | ICD-10-CM | POA: Diagnosis not present

## 2020-08-05 DIAGNOSIS — E782 Mixed hyperlipidemia: Secondary | ICD-10-CM | POA: Diagnosis not present

## 2020-08-05 DIAGNOSIS — Z0001 Encounter for general adult medical examination with abnormal findings: Secondary | ICD-10-CM | POA: Diagnosis not present

## 2020-08-05 DIAGNOSIS — Z6829 Body mass index (BMI) 29.0-29.9, adult: Secondary | ICD-10-CM

## 2020-08-05 DIAGNOSIS — I447 Left bundle-branch block, unspecified: Secondary | ICD-10-CM

## 2020-08-05 DIAGNOSIS — H9201 Otalgia, right ear: Secondary | ICD-10-CM

## 2020-08-05 DIAGNOSIS — Z1389 Encounter for screening for other disorder: Secondary | ICD-10-CM

## 2020-08-05 DIAGNOSIS — E559 Vitamin D deficiency, unspecified: Secondary | ICD-10-CM | POA: Diagnosis not present

## 2020-08-05 DIAGNOSIS — R7309 Other abnormal glucose: Secondary | ICD-10-CM

## 2020-08-05 DIAGNOSIS — M797 Fibromyalgia: Secondary | ICD-10-CM

## 2020-08-05 DIAGNOSIS — E039 Hypothyroidism, unspecified: Secondary | ICD-10-CM

## 2020-08-05 DIAGNOSIS — J301 Allergic rhinitis due to pollen: Secondary | ICD-10-CM

## 2020-08-05 DIAGNOSIS — Z136 Encounter for screening for cardiovascular disorders: Secondary | ICD-10-CM

## 2020-08-05 DIAGNOSIS — H9202 Otalgia, left ear: Secondary | ICD-10-CM

## 2020-08-05 DIAGNOSIS — Z0000 Encounter for general adult medical examination without abnormal findings: Secondary | ICD-10-CM | POA: Diagnosis not present

## 2020-08-05 DIAGNOSIS — I10 Essential (primary) hypertension: Secondary | ICD-10-CM

## 2020-08-05 DIAGNOSIS — Z131 Encounter for screening for diabetes mellitus: Secondary | ICD-10-CM | POA: Diagnosis not present

## 2020-08-05 DIAGNOSIS — R030 Elevated blood-pressure reading, without diagnosis of hypertension: Secondary | ICD-10-CM

## 2020-08-05 DIAGNOSIS — E2839 Other primary ovarian failure: Secondary | ICD-10-CM

## 2020-08-05 DIAGNOSIS — H6122 Impacted cerumen, left ear: Secondary | ICD-10-CM

## 2020-08-05 MED ORDER — PHENTERMINE HCL 15 MG PO CAPS: 15.0000 mg | ORAL_CAPSULE | ORAL | 0 refills | Status: DC

## 2020-08-05 NOTE — Progress Notes (Signed)
COMPLETE PHYSICAL  Assessment and Plan:  Amiley was seen today for annual exam.  Diagnoses and all orders for this visit:  Encounter for general adult medical examination with abnormal findings Yearly -     CBC with Differential/Platelet -     COMPLETE METABOLIC PANEL WITH GFR -     Magnesium  B12 deficiency No supplementation at this time -     Vitamin B12  Screening for cardiovascular condition -     EKG 12-Lead  LBBB (left bundle branch block) No chest pain, follows with cardiology  Hypothyroidism, unspecified type Taking levothyroxine 112 mcg daily Reminder to take on an empty stomach 30-70mins before first meal of the day. No antacid medications for 4 hours. -     TSH  Elevated blood pressure reading Continue to monitor  Hyperlipidemia, mixed Rosuvastatin 5mg  nightly and zetia 10mg  Discussed dietary and exercise modifications Low fat diet -     Lipid panel  Vitamin D deficiency Continue supplementation to maintain goal of 70-100 Taking Vitamin D 1,000IU daily -     VITAMIN D 25 Hydroxy (Vit-D Deficiency, Fractures)  Fibromyalgia Try tumeric 867m BID  Allergic rhinitis due to pollen, unspecified seasonality Doing well at this time Taking loratadine  BMI 29.0-29.9,adult -     phentermine 15 MG capsule; Take 1 capsule (15 mg total) by mouth every morning. 67min-1hour before first meal.  Screening for blood or protein in urine -     Urinalysis w microscopic + reflex cultur  Bilateral ear pain Impacted cerumen of left ear -     Ear Lavage Tolerated well, canal clear Discussed ear hygiene  Estrogen deficiency -     DG Bone Density; Future  Abnormal glucose -     Hemoglobin A1c   Further disposition pending results if labs check today. Discussed med's effects and SE's.   Over 30 minutes of face to face interview, exam, counseling, chart review, and critical decision making was performed.    HPI  64 y.o. female  presents for a complete  physical.  She reports that she used to walk 3 miles.  She has been prescribed phentermine in the past but she never did try it.  She would like to start this. She reports that she drinks a mini can, two a day, Mt Dew.  She drinks coffee with sweet and low.  Reports she knows she does not drink enough water.  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   Her blood pressure has been controlled at home and is taking olmesartan 20mg  daily, today their BP is BP: 132/76.  She does not workout. She denies chest pain, shortness of breath, dizziness.  BMI is Body mass index is 29.65 kg/m., she is working on diet and exercise. Wt Readings from Last 3 Encounters:  08/05/20 195 lb (88.5 kg)  05/29/20 191 lb 12.8 oz (87 kg)  04/16/20 192 lb 12.8 oz (87.5 kg)   She is on cholesterol medication and taking rosuvastatin 5mg . Her cholesterol is not at goal, she refuses statins, never started zetia.  The cholesterol last visit was:  Lab Results  Component Value Date   CHOL 283 (H) 04/16/2020   HDL 50 04/16/2020   LDLCALC 179 (H) 04/16/2020   LDLDIRECT 75.3 09/11/2010   TRIG 319 (H) 04/16/2020   CHOLHDL 5.7 (H) 04/16/2020  . Last A1C in the office was:  Lab Results  Component Value Date   HGBA1C 5.4 04/16/2020  Patient is on Vitamin D supplement, she is on 1,000 IU daily Lab Results  Component Value Date   VD25OH 70 04/16/2020     She has not seen obgyn recently.  Her last pap smear appears to be in 2008.  She has not had one since then, declines today.  She is on thyroid medication. Her medication was not changed last visit.   Lab Results  Component Value Date   TSH 0.62 04/16/2020   Current Medications:   Current Outpatient Medications (Endocrine & Metabolic):  .  levothyroxine (SYNTHROID) 112 MCG tablet, Take 1 tablet daily on an empty stomach with only water for 30 minutes & no Antacid meds, Calcium or Magnesium for 4 hours & avoid  Biotin  Current Outpatient Medications (Cardiovascular):  .  bisoprolol-hydrochlorothiazide (ZIAC) 5-6.25 MG tablet, TAKE 1 TABLET DAILY FOR BLOOD PRESSURE. Marland Kitchen  ezetimibe (ZETIA) 10 MG tablet, Take 1 tablet Daily for Cholesterol to Prevent Heart Attacks, strokes  & Dementia .  olmesartan (BENICAR) 20 MG tablet, Take 1/2 to 1 tablet at Night for BP .  rosuvastatin (CRESTOR) 5 MG tablet, Take 1 tablet Daily for Cholesterol  Current Outpatient Medications (Respiratory):  .  fluticasone furoate-vilanterol (BREO ELLIPTA) 100-25 MCG/INH AEPB, Inhale 1 puff into the lungs daily. Rinse mouth with water after each use .  Levocetirizine Dihydrochloride (XYZAL PO), Take 1 tablet by mouth daily. .  Loratadine (CLARITIN) 10 MG CAPS, Take by mouth. .  Triamcinolone Acetonide (NASACORT AQ NA), Place 1 spray into the nose daily. OTC    Current Outpatient Medications (Other):  .  cholecalciferol (VITAMIN D) 1000 units tablet, Take 1,000 Units by mouth daily. Marland Kitchen  escitalopram (LEXAPRO) 10 MG tablet, Take 1 tablet Daily for Mood & Anxiety .  esomeprazole (NEXIUM) 20 MG capsule, Take 20 mg by mouth daily at 12 noon.  Health Maintenance:   Immunization History  Administered Date(s) Administered  . Influenza Split 07/10/2014, 08/06/2015  . Influenza Whole 07/06/2007  . Influenza,inj,quad, With Preservative 09/07/2016  . Td 10/05/2001  . Tdap 08/06/2015   Tetanus: 2016 Pneumovax: DUE Flu vaccine: 2020 Shingrix: DUE, discussed with patient today Pap: 2008, declines today but will think about, discussed that if one more was normal would be last one.  SARS-COV2-Pfizer complete 01/2020 MGM:04/2017 given number, Will call to schedule with DEXA  Colonoscopy: refused.  Willing to do cologuard,ordered 01/04/20 PAP: 2008, declines Last Dental Exam:  2017 Last Eye Exam:  [facility]  Patient Care Team: Lucky Cowboy, MD as PCP - General (Internal Medicine)  Medical History:  Past Medical  History:  Diagnosis Date  . GERD (gastroesophageal reflux disease)   . Unspecified hypothyroidism   . Unspecified vitamin D deficiency    Allergies Allergies  Allergen Reactions  . Codeine     REACTION: hallucinations  . Mometasone Furoate     REACTION: headache  . Zetia [Ezetimibe] Diarrhea and Nausea Only    Prob her sx's were due to IBS & Coincidental     SURGICAL HISTORY She  has no past surgical history on file. FAMILY HISTORY Her family history includes Arthritis in her mother; Breast cancer in her sister; COPD in her mother; Cancer in her sister; Depression in her mother and sister; Heart disease in her father and sister; Hypertension in her mother; Kidney disease in her mother. SOCIAL HISTORY She  reports that she quit smoking about 38 years ago. She has never used smokeless tobacco. She reports that she does not drink alcohol and does  not use drugs.  Smoked 10-15 years  Review of Systems: Review of Systems  Constitutional: Positive for malaise/fatigue. Negative for chills, diaphoresis, fever and weight loss.  HENT: Positive for ear pain and hearing loss. Negative for congestion, sinus pain, sore throat and tinnitus.   Eyes: Negative.   Respiratory: Negative for cough, shortness of breath and wheezing.   Cardiovascular: Negative for chest pain, palpitations and leg swelling.  Gastrointestinal: Negative for abdominal pain, blood in stool, constipation, diarrhea, heartburn and melena.  Genitourinary: Negative.   Skin: Negative.   Neurological: Negative for dizziness, sensory change, loss of consciousness, weakness and headaches.  Psychiatric/Behavioral: Negative for depression. The patient is not nervous/anxious and does not have insomnia.     Physical Exam: Estimated body mass index is 29.65 kg/m as calculated from the following:   Height as of this encounter: 5\' 8"  (1.727 m).   Weight as of this encounter: 195 lb (88.5 kg). BP 132/76   Pulse 81   Temp 98.1 F  (36.7 C)   Ht 5\' 8"  (1.727 m)   Wt 195 lb (88.5 kg)   LMP  (LMP Unknown)   SpO2 98%   BMI 29.65 kg/m   General Appearance: Well nourished well developed, in no apparent distress.  Eyes: PERRLA, EOMs, conjunctiva no swelling or erythema ENT/Mouth: Left Ear canal normal without obstruction,Right ear with some canal redness and swelling.  No left ear swelling, erythema, or discharge.  TMs normal bilaterally with no erythema, bulging, retraction, or loss of landmark.  Oropharynx moist and clear with no exudate, erythema, or swelling.   Neck: Supple, thyroid normal. No bruits.  No cervical adenopathy Respiratory: Respiratory effort normal, Breath sounds clear A&P without wheeze, rhonchi, rales.   Cardio: RRR without murmurs, rubs or gallops. Brisk peripheral pulses without edema.  Chest: symmetric, with normal excursions Abdomen: Soft, nontender, no guarding, rebound, hernias, masses, or organomegaly.  Lymphatics: Non tender without lymphadenopathy.  Genitourinary: Patient declined pap smear and pelvic exam Musculoskeletal: Full ROM all peripheral extremities,5/5 strength, and normal gait.  Skin: Warm, dry without rashes, lesions, ecchymosis. Neuro: Awake and oriented X 3, Cranial nerves intact, reflexes equal bilaterally. Normal muscle tone, no cerebellar symptoms. Sensation intact.  Psych:  normal affect, Insight and Judgment appropriate.    , NP 10:12 AM [facility]

## 2020-08-05 NOTE — Patient Instructions (Addendum)
Increase water intake.  Try flavoring or flavored water.  Try setting out 4 bottles on the counter or fill a pitcher with water in the fridge to complete for the day.  Goal:  Decrease the Mt Dew to one mini can a day.  We have sent in a prescription for phentermine 15mg .  Take this every morning to 1hour prior to your first meal.  Increase your water intake as this can make your mouth dry and also cause constipation.  This first month we will work on cutting all sugary drinks.  Stop fruit juices, sweet tea, sodas and look at sugar content of any beverage you consume.  For example if your drink * number of sodas cut this in half.  Every month another small step will be added to help support the medication prescribed.  The medicine alone will not be effective.  It takes lifestyle and behavior changes to be successful with your goals and to maintain them.  Small changes make a large impact!  We will follow up in one month to check on your progress and a weight check.    You are due for mammogram and Bone Density (DEXA scran).  You can schedule these together at the breast center.  HOW TO SCHEDULE Mammogram, Bone Density, DEXA  [facility]  7 a.m.-6:30 p.m., Monday 7 a.m.-5 p.m., Tuesday-Friday Schedule an appointment by calling [phone].    You are due for Pneumonia vaccine and Shingrix (shingles vaccination)  Below is information on Shingles vaccine.   Ask insurance and pharmacy about shingrix - it is a 2 part shot that we will not be getting in the office.   Suggest getting AFTER covid vaccines, have to wait at least a month This shot can make you feel bad due to such good immune response it can trigger some inflammation so take tylenol or aleve day of or day after and plan on resting.   Can go to [phone] for more information  Shingrix Vaccination  Two vaccines are licensed and  recommended to prevent shingles in the [place] Zoster vaccine live (ZVL, Zostavax) has been in use since 2006. Recombinant zoster vaccine (RZV, Shingrix), has been in use since 2017 and is recommended by ACIP as the preferred shingles vaccine.  What Everyone Should Know about Shingles Vaccine (Shingrix) One of the Recommended Vaccines by Disease Shingles vaccination is the only way to protect against shingles and postherpetic neuralgia (PHN), the most common complication from shingles. CDC recommends that healthy adults 50 years and older get two doses of the shingles vaccine called Shingrix (recombinant zoster vaccine), separated by 2 to 6 months, to prevent shingles and the complications from the disease. Your doctor or pharmacist can give you Shingrix as a shot in your upper arm. Shingrix provides strong protection against shingles and PHN. Two doses of Shingrix is more than 90% effective at preventing shingles and PHN. Protection stays above 85% for at least the first four years after you get vaccinated. Shingrix is the preferred vaccine, over Zostavax (zoster vaccine live), a shingles vaccine in use since 2006. Zostavax may still be used to prevent shingles in healthy adults 60 years and older. For example, you could use Zostavax if a person is allergic to Shingrix, prefers Zostavax, or requests immediate vaccination and Shingrix is unavailable. Who Should Get Shingrix? Healthy adults 50 years and older should get two doses of Shingrix, separated by 2 to 6 months. You should get Shingrix even  if in the past you . had shingles  . received Zostavax  . are not sure if you had chickenpox There is no maximum age for getting Shingrix. If you had shingles in the past, you can get Shingrix to help prevent future occurrences of the disease. There is no specific length of time that you need to wait after having shingles before you can receive Shingrix, but generally you should make sure the shingles rash has  gone away before getting vaccinated. You can get Shingrix whether or not you remember having had chickenpox in the past. Studies show that more than 99% of Americans 40 years and older have had chickenpox, even if they don't remember having the disease. Chickenpox and shingles are related because they are caused by the same virus (varicella zoster virus). After a person recovers from chickenpox, the virus stays dormant (inactive) in the body. It can reactivate years later and cause shingles. If you had Zostavax in the recent past, you should wait at least eight weeks before getting Shingrix. Talk to your healthcare provider to determine the best time to get Shingrix. Shingrix is available in [employer] and pharmacies. To find doctor's offices or pharmacies near you that offer the vaccine, visit HealthMap Vaccine FinderExternal. If you have questions about Shingrix, talk with your healthcare provider. Vaccine for Those 50 Years and Older  Shingrix reduces the risk of shingles and PHN by more than 90% in people 64 and older. CDC recommends the vaccine for healthy adults 50 and older.  Who Should Not Get Shingrix? You should not get Shingrix if you: . have ever had a severe allergic reaction to any component of the vaccine or after a dose of Shingrix  . tested negative for immunity to varicella zoster virus. If you test negative, you should get chickenpox vaccine.  . currently have shingles  . currently are pregnant or breastfeeding. Women who are pregnant or breastfeeding should wait to get Shingrix.  Marland Kitchen receive specific antiviral drugs (acyclovir, famciclovir, or valacyclovir) 24 hours before vaccination (avoid use of these antiviral drugs for 14 days after vaccination)- zoster vaccine live only If you have a minor acute (starts suddenly) illness, such as a cold, you may get Shingrix. But if you have a moderate or severe acute illness, you should usually wait until you recover before getting the  vaccine. This includes anyone with a temperature of 101.69F or higher. The side effects of the Shingrix are temporary, and usually last 2 to 3 days. While you may experience pain for a few days after getting Shingrix, the pain will be less severe than having shingles and the complications from the disease. How Well Does Shingrix Work? Two doses of Shingrix provides strong protection against shingles and postherpetic neuralgia (PHN), the most common complication of shingles. . In adults 60 to 65 years old who got two doses, Shingrix was 97% effective in preventing shingles; among adults 70 years and older, Shingrix was 91% effective.  . In adults 53 to 65 years old who got two doses, Shingrix was 91% effective in preventing PHN; among adults 70 years and older, Shingrix was 89% effective. Shingrix protection remained high (more than 85%) in people 70 years and older throughout the four years following vaccination. Since your risk of shingles and PHN increases as you get older, it is important to have strong protection against shingles in your older years. Top of Page  What Are the Possible Side Effects of Shingrix? Studies show that Shingrix is  safe. The vaccine helps your body create a strong defense against shingles. As a result, you are likely to have temporary side effects from getting the shots. The side effects may affect your ability to do normal daily activities for 2 to 3 days. Most people got a sore arm with mild or moderate pain after getting Shingrix, and some also had redness and swelling where they got the shot. Some people felt tired, had muscle pain, a headache, shivering, fever, stomach pain, or nausea. About 1 out of 6 people who got Shingrix experienced side effects that prevented them from doing regular activities. Symptoms went away on their own in about 2 to 3 days. Side effects were more common in younger people. You might have a reaction to the first or second dose of Shingrix, or  both doses. If you experience side effects, you may choose to take over-the-counter pain medicine such as ibuprofen or acetaminophen. If you experience side effects from Shingrix, you should report them to the Vaccine Adverse Event Reporting System (VAERS). Your doctor might file this report, or you can do it yourself through the VAERS websiteExternal, or by calling [phone]. If you have any questions about side effects from Shingrix, talk with your doctor. The shingles vaccine does not contain thimerosal (a preservative containing mercury). Top of Page  When Should I See a Doctor Because of the Side Effects I Experience From Shingrix? In clinical trials, Shingrix was not associated with serious adverse events. In fact, serious side effects from vaccines are extremely rare. For example, for every 1 million doses of a vaccine given, only one or two people may have a severe allergic reaction. Signs of an allergic reaction happen within minutes or hours after vaccination and include hives, swelling of the face and throat, difficulty breathing, a fast heartbeat, dizziness, or weakness. If you experience these or any other life-threatening symptoms, see a doctor right away. Shingrix causes a strong response in your immune system, so it may produce short-term side effects more intense than you are used to from other vaccines. These side effects can be uncomfortable, but they are expected and usually go away on their own in 2 or 3 days. Top of Page  How Can I Pay For Shingrix? There are several ways shingles vaccine may be paid for: Medicare . Medicare Part D plans cover the shingles vaccine, but there may be a cost to you depending on your plan. There may be a copay for the vaccine, or you may need to pay in full then get reimbursed for a certain amount.  . Medicare Part B does not cover the shingles vaccine. Medicaid . Medicaid may or may not cover the vaccine. Contact your insurer to find  out. Private health insurance . Many private health insurance plans will cover the vaccine, but there may be a cost to you depending on your plan. Contact your insurer to find out. Vaccine assistance programs . Some pharmaceutical companies provide vaccines to eligible adults who cannot afford them. You may want to check with the vaccine manufacturer, [employer], about Shingrix. If you do not currently have health insurance, learn more about affordable health coverage optionsExternal. To find doctor's offices or pharmacies near you that offer the vaccine, visit HealthMap Vaccine FinderExternal.

## 2020-08-06 LAB — CBC WITH DIFFERENTIAL/PLATELET
Absolute Monocytes: 575 cells/uL (ref 200–950)
Basophils Absolute: 92 cells/uL (ref 0–200)
Basophils Relative: 1.3 %
Eosinophils Absolute: 128 cells/uL (ref 15–500)
Eosinophils Relative: 1.8 %
HCT: 43.5 % (ref 35.0–45.0)
Hemoglobin: 14.2 g/dL (ref 11.7–15.5)
Lymphs Abs: 1924 cells/uL (ref 850–3900)
MCH: 29.3 pg (ref 27.0–33.0)
MCHC: 32.6 g/dL (ref 32.0–36.0)
MCV: 89.7 fL (ref 80.0–100.0)
MPV: 9.2 fL (ref 7.5–12.5)
Monocytes Relative: 8.1 %
Neutro Abs: 4381 cells/uL (ref 1500–7800)
Neutrophils Relative %: 61.7 %
Platelets: 305 10*3/uL (ref 140–400)
RBC: 4.85 10*6/uL (ref 3.80–5.10)
RDW: 13.4 % (ref 11.0–15.0)
Total Lymphocyte: 27.1 %
WBC: 7.1 10*3/uL (ref 3.8–10.8)

## 2020-08-06 LAB — URINALYSIS W MICROSCOPIC + REFLEX CULTURE
Bacteria, UA: NONE SEEN /HPF
Bilirubin Urine: NEGATIVE
Glucose, UA: NEGATIVE
Hgb urine dipstick: NEGATIVE
Hyaline Cast: NONE SEEN /LPF
Ketones, ur: NEGATIVE
Leukocyte Esterase: NEGATIVE
Nitrites, Initial: NEGATIVE
Protein, ur: NEGATIVE
RBC / HPF: NONE SEEN /HPF (ref 0–2)
Specific Gravity, Urine: 1.006 (ref 1.001–1.03)
Squamous Epithelial / HPF: NONE SEEN /HPF (ref <=5)
WBC, UA: NONE SEEN /HPF (ref 0–5)
pH: 6 (ref 5.0–8.0)

## 2020-08-06 LAB — COMPLETE METABOLIC PANEL WITH GFR
AG Ratio: 1.6 (calc) (ref 1.0–2.5)
ALT: 15 U/L (ref 6–29)
AST: 18 U/L (ref 10–35)
Albumin: 4.5 g/dL (ref 3.6–5.1)
Alkaline phosphatase (APISO): 109 U/L (ref 37–153)
BUN: 12 mg/dL (ref 7–25)
CO2: 30 mmol/L (ref 20–32)
Calcium: 9.9 mg/dL (ref 8.6–10.4)
Chloride: 103 mmol/L (ref 98–110)
Creat: 0.81 mg/dL (ref 0.50–0.99)
GFR, Est African American: 88 mL/min/{1.73_m2} (ref >=60)
GFR, Est Non African American: 76 mL/min/{1.73_m2} (ref >=60)
Globulin: 2.9 g/dL (calc) (ref 1.9–3.7)
Glucose, Bld: 84 mg/dL (ref 65–99)
Potassium: 4.3 mmol/L (ref 3.5–5.3)
Sodium: 141 mmol/L (ref 135–146)
Total Bilirubin: 0.5 mg/dL (ref 0.2–1.2)
Total Protein: 7.4 g/dL (ref 6.1–8.1)

## 2020-08-06 LAB — LIPID PANEL
Cholesterol: 186 mg/dL (ref <200)
HDL: 51 mg/dL (ref >=50)
LDL Cholesterol (Calc): 97 mg/dL (calc)
Non-HDL Cholesterol (Calc): 135 mg/dL (calc) — ABNORMAL HIGH (ref <130)
Total CHOL/HDL Ratio: 3.6 (calc) (ref <5.0)
Triglycerides: 268 mg/dL — ABNORMAL HIGH (ref <150)

## 2020-08-06 LAB — HEMOGLOBIN A1C
Hgb A1c MFr Bld: 5.5 % of total Hgb (ref <5.7)
Mean Plasma Glucose: 111 (calc)
eAG (mmol/L): 6.2 (calc)

## 2020-08-06 LAB — NO CULTURE INDICATED

## 2020-08-06 LAB — MAGNESIUM: Magnesium: 2.2 mg/dL (ref 1.5–2.5)

## 2020-08-06 LAB — VITAMIN B12: Vitamin B-12: 440 pg/mL (ref 200–1100)

## 2020-08-06 LAB — TSH: TSH: 0.47 mIU/L (ref 0.40–4.50)

## 2020-08-06 LAB — VITAMIN D 25 HYDROXY (VIT D DEFICIENCY, FRACTURES): Vit D, 25-Hydroxy: 103 ng/mL — ABNORMAL HIGH (ref 30–100)

## 2020-08-06 NOTE — Progress Notes (Signed)
Please contact with lab results:   -Blood count is in normal range  -Electrolytes, kidney and liver function are in normal range.  -Your cholesterol Improved from last check.  Trigycerides improved 268, goal 150 or less.  Decrease carbohydrates, bread, pasta, potatoes, and sugar.  -Your thyroid, TSH, has been trending lower pat three checks.  We should decrease the dose.  Take levothyroxine one tablet five days a week and half tablet two days a week.  -A1c, looking at glucose in your blood over the past three months looks good at 5.5%  -Your Vitamin D is high 103, take 1,000IU 5days a week.  -Urine sample: No abnormalities  Let us know if you have any questions or concerns.   Sincerely,        Elder Negus, NP

## 2020-08-13 MED ORDER — OLMESARTAN MEDOXOMIL 20 MG PO TABS: ORAL_TABLET | ORAL | 0 refills | Status: DC

## 2020-09-03 DIAGNOSIS — M5136 Other intervertebral disc degeneration, lumbar region: Secondary | ICD-10-CM | POA: Diagnosis not present

## 2020-09-03 DIAGNOSIS — M5134 Other intervertebral disc degeneration, thoracic region: Secondary | ICD-10-CM | POA: Diagnosis not present

## 2020-09-03 DIAGNOSIS — M9903 Segmental and somatic dysfunction of lumbar region: Secondary | ICD-10-CM | POA: Diagnosis not present

## 2020-09-03 DIAGNOSIS — M9902 Segmental and somatic dysfunction of thoracic region: Secondary | ICD-10-CM | POA: Diagnosis not present

## 2020-09-05 DIAGNOSIS — M5136 Other intervertebral disc degeneration, lumbar region: Secondary | ICD-10-CM | POA: Diagnosis not present

## 2020-09-05 DIAGNOSIS — M9903 Segmental and somatic dysfunction of lumbar region: Secondary | ICD-10-CM | POA: Diagnosis not present

## 2020-09-05 DIAGNOSIS — M9902 Segmental and somatic dysfunction of thoracic region: Secondary | ICD-10-CM | POA: Diagnosis not present

## 2020-09-05 DIAGNOSIS — M5134 Other intervertebral disc degeneration, thoracic region: Secondary | ICD-10-CM | POA: Diagnosis not present

## 2020-09-10 DIAGNOSIS — M9902 Segmental and somatic dysfunction of thoracic region: Secondary | ICD-10-CM | POA: Diagnosis not present

## 2020-09-10 DIAGNOSIS — M5136 Other intervertebral disc degeneration, lumbar region: Secondary | ICD-10-CM | POA: Diagnosis not present

## 2020-09-10 DIAGNOSIS — M5134 Other intervertebral disc degeneration, thoracic region: Secondary | ICD-10-CM | POA: Diagnosis not present

## 2020-09-10 DIAGNOSIS — M9903 Segmental and somatic dysfunction of lumbar region: Secondary | ICD-10-CM | POA: Diagnosis not present

## 2020-09-11 ENCOUNTER — Ambulatory Visit: Payer: Medicare HMO | Admitting: Adult Health Nurse Practitioner

## 2020-09-12 DIAGNOSIS — M5136 Other intervertebral disc degeneration, lumbar region: Secondary | ICD-10-CM | POA: Diagnosis not present

## 2020-09-12 DIAGNOSIS — M5134 Other intervertebral disc degeneration, thoracic region: Secondary | ICD-10-CM | POA: Diagnosis not present

## 2020-09-12 DIAGNOSIS — M9903 Segmental and somatic dysfunction of lumbar region: Secondary | ICD-10-CM | POA: Diagnosis not present

## 2020-09-12 DIAGNOSIS — M9902 Segmental and somatic dysfunction of thoracic region: Secondary | ICD-10-CM | POA: Diagnosis not present

## 2020-09-16 ENCOUNTER — Encounter: Payer: Self-pay | Admitting: Physician Assistant

## 2020-09-16 ENCOUNTER — Other Ambulatory Visit: Payer: Self-pay | Admitting: Internal Medicine

## 2020-09-16 DIAGNOSIS — U071 COVID-19: Secondary | ICD-10-CM

## 2020-09-16 DIAGNOSIS — I10 Essential (primary) hypertension: Secondary | ICD-10-CM | POA: Insufficient documentation

## 2020-09-17 ENCOUNTER — Ambulatory Visit (HOSPITAL_COMMUNITY)
Admission: RE | Admit: 2020-09-17 | Discharge: 2020-09-17 | Disposition: A | Discharge status: Home / Self Care | Payer: Medicare HMO | Source: Ambulatory Visit | Attending: Pulmonary Disease | Admitting: Pulmonary Disease

## 2020-09-17 ENCOUNTER — Other Ambulatory Visit: Payer: Self-pay | Admitting: Nurse Practitioner

## 2020-09-17 DIAGNOSIS — Z23 Encounter for immunization: Secondary | ICD-10-CM | POA: Diagnosis not present

## 2020-09-17 DIAGNOSIS — U071 COVID-19: Secondary | ICD-10-CM | POA: Insufficient documentation

## 2020-09-17 MED ORDER — SODIUM CHLORIDE 0.9 % IV SOLN: Freq: Once | INTRAVENOUS | Status: AC

## 2020-09-17 MED ORDER — FAMOTIDINE IN NACL 20-0.9 MG/50ML-% IV SOLN: 20.0000 mg | Freq: Once | INTRAVENOUS | Status: DC | PRN

## 2020-09-17 MED ORDER — EPINEPHRINE 0.3 MG/0.3ML IJ SOAJ: 0.3000 mg | Freq: Once | INTRAMUSCULAR | Status: DC | PRN

## 2020-09-17 MED ORDER — METHYLPREDNISOLONE SODIUM SUCC 125 MG IJ SOLR: 125.0000 mg | Freq: Once | INTRAMUSCULAR | Status: DC | PRN

## 2020-09-17 MED ORDER — ALBUTEROL SULFATE HFA 108 (90 BASE) MCG/ACT IN AERS: 2.0000 | INHALATION_SPRAY | Freq: Once | RESPIRATORY_TRACT | Status: DC | PRN

## 2020-09-17 MED ORDER — DIPHENHYDRAMINE HCL 50 MG/ML IJ SOLN: 50.0000 mg | Freq: Once | INTRAMUSCULAR | Status: DC | PRN

## 2020-09-17 MED ORDER — SODIUM CHLORIDE 0.9 % IV SOLN: INTRAVENOUS | Status: DC | PRN

## 2020-09-17 NOTE — Progress Notes (Signed)
Patient reviewed Fact Sheet for Patients, Parents, and Caregivers for Emergency Use Authorization (EUA) of Bamlanivimab for the Treatment of Coronavirus. Patient also reviewed and is agreeable to the estimated cost of treatment. Patient is agreeable to proceed.   

## 2020-09-17 NOTE — Progress Notes (Signed)
  Diagnosis: COVID-19  Physician: Dr. Wright  Procedure: Covid Infusion Clinic Med: bamlanivimab\etesevimab infusion - Provided patient with bamlanimivab\etesevimab fact sheet for patients, parents and caregivers prior to infusion.  Complications: No immediate complications noted.  Discharge: Discharged home   Denise Jensen 09/17/2020  

## 2020-09-17 NOTE — Progress Notes (Signed)
I connected by phone with Denise Jensen on 09/17/2020 at 12:49 PM to discuss the potential use of a treatment for mild to moderate COVID-19 viral infection in non-hospitalized patients.  This patient is a 64 y.o. female that meets the FDA criteria for Emergency Use Authorization of bamlanivimab/etesevimab, casirivimab\imdevimab, or sotrovimab  Has a (+) direct SARS-CoV-2 viral test result  Has mild or moderate COVID-19   Is ? 65 years of age and weighs ? 40 kg  Is NOT hospitalized due to COVID-19  Is NOT requiring oxygen therapy or requiring an increase in baseline oxygen flow rate due to COVID-19  Is within 10 days of symptom onset  Has at least one of the high risk factor(s) for progression to severe COVID-19 and/or hospitalization as defined in EUA.  Specific high risk criteria : Older age (>/= 65 yo), BMI > 25, Cardiovascular disease or hypertension and Other high risk medical condition per CDC:  svi   I have spoken and communicated the following to the patient or parent/caregiver:  1. FDA has authorized the emergency use of bamlanivimab/etesevimab, casirivimab\imdevimab, or sotrovimab for the treatment of mild to moderate COVID-19 in adults and pediatric patients with positive results of direct SARS-CoV-2 viral testing who are 60 years of age and older weighing at least 40 kg, and who are at high risk for progressing to severe COVID-19 and/or hospitalization.  2. The significant known and potential risks and benefits of bamlanivimab/etesevimab, casirivimab\imdevimab, or sotrovimab, and the extent to which such potential risks and benefits are unknown.  3. Information on available alternative treatments and the risks and benefits of those alternatives, including clinical trials.  4. Patients treated with bamlanivimab/etesevimab, casirivimab\imdevimab, or sotrovimab should continue to self-isolate and use infection control measures (e.g., wear mask, isolate, social distance, avoid  sharing personal items, clean and disinfect "high touch" surfaces, and frequent handwashing) according to CDC guidelines.   5. The patient or parent/caregiver has the option to accept or refuse bamlanivimab/etesevimab, casirivimab\imdevimab, or sotrovimab.  After reviewing this information with the patient, the patient has agreed to receive one of the available covid 19 monoclonal antibodies and will be provided an appropriate fact sheet prior to infusion.[name], DNP, AGNP-C [phone] (Infusion Center Hotline)

## 2020-09-17 NOTE — Discharge Instructions (Signed)
10 Things You Can Do to Manage Your COVID-19 Symptoms at Home If you have possible or confirmed COVID-19: 1. Stay home from work and school. And stay away from other public places. If you must go out, avoid using any kind of public transportation, ridesharing, or taxis. 2. Monitor your symptoms carefully. If your symptoms get worse, call your healthcare provider immediately. 3. Get rest and stay hydrated. 4. If you have a medical appointment, call the healthcare provider ahead of time and tell them that you have or may have COVID-19. 5. For medical emergencies, call 911 and notify the dispatch personnel that you have or may have COVID-19. 6. Cover your cough and sneezes with a tissue or use the inside of your elbow. 7. Wash your hands often with soap and water for at least 20 seconds or clean your hands with an alcohol-based hand sanitizer that contains at least 60% alcohol. 8. As much as possible, stay in a specific room and away from other people in your home. Also, you should use a separate bathroom, if available. If you need to be around other people in or outside of the home, wear a mask. 9. Avoid sharing personal items with other people in your household, like dishes, towels, and bedding. 10. Clean all surfaces that are touched often, like counters, tabletops, and doorknobs. Use household cleaning sprays or wipes according to the label instructions. cdc.gov/coronavirus 04/05/2019 This information is not intended to replace advice given to you by your health care provider. Make sure you discuss any questions you have with your health care provider. Document Revised: 09/07/2019 Document Reviewed: 09/07/2019 Elsevier Patient Education  2020 [employer] What types of side effects do monoclonal antibody drugs cause?  Common side effects  In general, the more common side effects caused by monoclonal antibody drugs include: . Allergic reactions, such as hives or itching . Flu-like signs and  symptoms, including chills, fatigue, fever, and muscle aches and pains . Nausea, vomiting . Diarrhea . Skin rashes . Low blood pressure   The CDC is recommending patients who receive monoclonal antibody treatments wait at least 90 days before being vaccinated.  Currently, there are no data on the safety and efficacy of mRNA COVID-19 vaccines in persons who received monoclonal antibodies or convalescent plasma as part of COVID-19 treatment. Based on the estimated half-life of such therapies as well as evidence suggesting that reinfection is uncommon in the 90 days after initial infection, vaccination should be deferred for at least 90 days, as a precautionary measure until additional information becomes available, to avoid interference of the antibody treatment with vaccine-induced immune responses. If you have any questions or concerns after the infusion please call the Advanced Practice Provider on call at [phone]. This number is ONLY intended for your use regarding questions or concerns about the infusion post-treatment side-effects.  Please do not provide this number to others for use. For return to work notes please contact your primary care provider.   If someone you know is interested in receiving treatment please have them call the COVID hotline at [phone].   

## 2020-09-23 DIAGNOSIS — M5134 Other intervertebral disc degeneration, thoracic region: Secondary | ICD-10-CM | POA: Diagnosis not present

## 2020-09-23 DIAGNOSIS — M5136 Other intervertebral disc degeneration, lumbar region: Secondary | ICD-10-CM | POA: Diagnosis not present

## 2020-09-23 DIAGNOSIS — M9903 Segmental and somatic dysfunction of lumbar region: Secondary | ICD-10-CM | POA: Diagnosis not present

## 2020-09-23 DIAGNOSIS — M9902 Segmental and somatic dysfunction of thoracic region: Secondary | ICD-10-CM | POA: Diagnosis not present

## 2020-10-18 ENCOUNTER — Other Ambulatory Visit: Payer: Self-pay | Admitting: Internal Medicine

## 2020-10-18 DIAGNOSIS — E039 Hypothyroidism, unspecified: Secondary | ICD-10-CM

## 2020-12-09 ENCOUNTER — Other Ambulatory Visit: Payer: Self-pay | Admitting: Internal Medicine

## 2020-12-09 DIAGNOSIS — E782 Mixed hyperlipidemia: Secondary | ICD-10-CM

## 2020-12-19 DIAGNOSIS — M5134 Other intervertebral disc degeneration, thoracic region: Secondary | ICD-10-CM | POA: Diagnosis not present

## 2020-12-19 DIAGNOSIS — M5136 Other intervertebral disc degeneration, lumbar region: Secondary | ICD-10-CM | POA: Diagnosis not present

## 2020-12-19 DIAGNOSIS — M9903 Segmental and somatic dysfunction of lumbar region: Secondary | ICD-10-CM | POA: Diagnosis not present

## 2020-12-19 DIAGNOSIS — M9902 Segmental and somatic dysfunction of thoracic region: Secondary | ICD-10-CM | POA: Diagnosis not present

## 2020-12-23 ENCOUNTER — Ambulatory Visit (INDEPENDENT_AMBULATORY_CARE_PROVIDER_SITE_OTHER): Payer: Medicare HMO | Admitting: Adult Health Nurse Practitioner

## 2020-12-23 ENCOUNTER — Encounter: Payer: Self-pay | Admitting: Adult Health Nurse Practitioner

## 2020-12-23 ENCOUNTER — Other Ambulatory Visit: Payer: Self-pay

## 2020-12-23 VITALS — BP 126/80 | Temp 97.6°F | Wt 197.0 lb

## 2020-12-23 DIAGNOSIS — Z1211 Encounter for screening for malignant neoplasm of colon: Secondary | ICD-10-CM

## 2020-12-23 DIAGNOSIS — E039 Hypothyroidism, unspecified: Secondary | ICD-10-CM

## 2020-12-23 DIAGNOSIS — M797 Fibromyalgia: Secondary | ICD-10-CM | POA: Diagnosis not present

## 2020-12-23 DIAGNOSIS — Z0001 Encounter for general adult medical examination with abnormal findings: Secondary | ICD-10-CM | POA: Diagnosis not present

## 2020-12-23 DIAGNOSIS — I447 Left bundle-branch block, unspecified: Secondary | ICD-10-CM | POA: Diagnosis not present

## 2020-12-23 DIAGNOSIS — Z6829 Body mass index (BMI) 29.0-29.9, adult: Secondary | ICD-10-CM | POA: Diagnosis not present

## 2020-12-23 DIAGNOSIS — E559 Vitamin D deficiency, unspecified: Secondary | ICD-10-CM | POA: Diagnosis not present

## 2020-12-23 DIAGNOSIS — I10 Essential (primary) hypertension: Secondary | ICD-10-CM | POA: Diagnosis not present

## 2020-12-23 DIAGNOSIS — R42 Dizziness and giddiness: Secondary | ICD-10-CM

## 2020-12-23 DIAGNOSIS — E782 Mixed hyperlipidemia: Secondary | ICD-10-CM | POA: Diagnosis not present

## 2020-12-23 DIAGNOSIS — R6889 Other general symptoms and signs: Secondary | ICD-10-CM

## 2020-12-23 DIAGNOSIS — Z20822 Contact with and (suspected) exposure to covid-19: Secondary | ICD-10-CM

## 2020-12-23 DIAGNOSIS — E538 Deficiency of other specified B group vitamins: Secondary | ICD-10-CM

## 2020-12-23 DIAGNOSIS — J301 Allergic rhinitis due to pollen: Secondary | ICD-10-CM | POA: Diagnosis not present

## 2020-12-23 DIAGNOSIS — E2839 Other primary ovarian failure: Secondary | ICD-10-CM

## 2020-12-23 DIAGNOSIS — R030 Elevated blood-pressure reading, without diagnosis of hypertension: Secondary | ICD-10-CM

## 2020-12-23 DIAGNOSIS — Z0000 Encounter for general adult medical examination without abnormal findings: Secondary | ICD-10-CM

## 2020-12-23 DIAGNOSIS — R7309 Other abnormal glucose: Secondary | ICD-10-CM

## 2020-12-23 DIAGNOSIS — Z79899 Other long term (current) drug therapy: Secondary | ICD-10-CM

## 2020-12-23 DIAGNOSIS — F4323 Adjustment disorder with mixed anxiety and depressed mood: Secondary | ICD-10-CM

## 2020-12-23 DIAGNOSIS — R112 Nausea with vomiting, unspecified: Secondary | ICD-10-CM

## 2020-12-23 MED ORDER — MECLIZINE HCL 25 MG PO TABS
25.0000 mg | ORAL_TABLET | Freq: Three times a day (TID) | ORAL | 1 refills | Status: AC | PRN
Start: 2020-12-23 — End: ?

## 2020-12-23 MED ORDER — ONDANSETRON 8 MG PO TBDP
ORAL_TABLET | ORAL | 1 refills | Status: DC
Start: 2020-12-23 — End: 2021-12-23

## 2020-12-23 NOTE — Progress Notes (Signed)
AortaScan: WNL per provider  Diameter: less then 3 cm.

## 2020-12-23 NOTE — Progress Notes (Addendum)
MEDICARE ANNUAL WELLNESS    Assessment / Plan:    Denise Jensen was seen today for annual exam.  Diagnoses and all orders for this visit:  Welcome to Medicare Visit Yearly Due for Mammogram, DEXA, Cologuard, Pnuemovax and Shingrix Discussed with patient.  B12 deficiency No supplementation at this time -     Vitamin B12  LBBB (left bundle branch block) No chest pain EKG at physical Defer EKG today  Hypothyroidism, unspecified type Taking levothyroxine 112 mcg whote tablet 5 days a week and half tablet 2 days a week. Reminder to take on an empty stomach 30-28mins before first meal of the day. No antacid medications for 4 hours. -     TSH  Essential hypertension Continue current medications:bisoprolol 5-6.25mg  Monitor blood pressure at home; call if consistently over 130/80 Continue DASH diet.   Reminder to go to the ER if any CP, SOB, nausea, dizziness, severe HA, changes vision/speech, left arm numbness and tingling and jaw pain.   Hyperlipidemia, mixed Zetia 10mg .  Was prescribed rosuvastatin 5mg  but did not take this. Discussed dietary and exercise modifications Low fat diet -     Lipid panel  Vitamin D deficiency Continue supplementation to maintain goal of 70-100 Taking Vitamin D 1,000IU daily -     VITAMIN D 25 Hydroxy (Vit-D Deficiency, Fractures)  Fibromyalgia Taking tumeric 845m BID  Allergic rhinitis due to pollen, unspecified seasonality Doing well at this time Taking loratadine  BMI 29.0-29.9,adult -     phentermine 15 MG capsule; Take 1 capsule (15 mg total) by mouth every morning. 32min-1hour before first meal. Not taking at this time, did not try. Discussed monitoring food intake and changing to healthier options.  Estrogen deficiency -     DG Bone Density; Future  Abnormal glucose -     Hemoglobin A1c  Acute adjustment disorder with mixed anxiety & depression Doing well Continue escitalopram 10mg  daily  Medication  Management Continued   Further disposition pending results if labs check today. Discussed med's effects and SE's.   Over 40 minutes of face to face interview, exam, counseling, chart review, and critical decision making was performed.     Plan:   During the course of the visit the patient was educated and counseled about appropriate screening and preventive services including:    Pneumococcal vaccine   Prevnar 13  Influenza vaccine  Td vaccine  Screening electrocardiogram  Bone densitometry screening  Colorectal cancer screening  Diabetes screening  Glaucoma screening  Nutrition counseling   Advanced directives: requested   --------------------------------------------------------------------------------------------------  HPI  65 y.o. female  presents for a Welcome to Medicare Visit.   She reports her and her husband have been working to lose weight together. She has decreased her Mt Dew intake and now drinks one mini can, a day, 31m.   She drinks coffee with sweet and low.   Reports she knows she does not drink enough water.   Personal history of COVID19 09/2020 and monoclonal antibody infusion.  Had SARS-COV2 vaccination Pfizer 01/2020.  She reports some alteration in taste but improving.  Her smell has not returned, she reports the smell of a smoky fire but can not distinguish between smells, at times it is completely absent.  She has chronic pain bilateral shoulders, has seen rheumatoid in the past per patient, doing well at this time.  Her blood pressure has been controlled at home and is taking olmesartan 20mg  daily and bisoprolol 5-6.26mg  today their BP is BP: 126/80.  She  does not workout. She denies chest pain, shortness of breath, dizziness.   BMI is Body mass index is 29.95 kg/m., she is working on diet and exercise. Wt Readings from Last 3 Encounters:  12/23/20 197 lb (89.4 kg)  08/05/20 195 lb (88.5 kg)  05/29/20 191 lb 12.8 oz (87 kg)   She  is on cholesterol medication and taking rosuvastatin 5mg . Her cholesterol is not at goal, she refuses statins, never started zetia.  The cholesterol last visit was:  Lab Results  Component Value Date   CHOL 186 08/05/2020   HDL 51 08/05/2020   LDLCALC 97 08/05/2020   LDLDIRECT 75.3 09/11/2010   TRIG 268 (H) 08/05/2020   CHOLHDL 3.6 08/05/2020  . Last A1C in the office was:  Lab Results  Component Value Date   HGBA1C 5.5 08/05/2020   Patient is on Vitamin D supplement, she is on 1,000 IU daily Lab Results  Component Value Date   VD25OH 103 (H) 08/05/2020     She has not seen obgyn recently.  Her last pap smear appears to be in 2008.  She has not had one since then, declines today or further screenings.  She is on thyroid medication. Her medication was changed last visit: Levothyroxine 2009 whole tablet five days a week and half tablet two days a week.  Asymptomatic today.  Lab Results  Component Value Date   TSH 0.47 08/05/2020   Current Medications:   Current Outpatient Medications (Endocrine & Metabolic):  .  levothyroxine (SYNTHROID) 112 MCG tablet, TAKE 1 TABLET DAILY ON AN EMPTY STOMACH WITH ONLY WATER FOR 30 MINUTES & NO ANTACID MEDS, CALCIUM OR MAGNESIUM FOR 4 HOURS & AVOID BIOTIN  Current Outpatient Medications (Cardiovascular):  .  bisoprolol-hydrochlorothiazide (ZIAC) 5-6.25 MG tablet, TAKE 1 TABLET DAILY FOR BLOOD PRESSURE. 10-08-2001  ezetimibe (ZETIA) 10 MG tablet, Take 1 tablet Daily for Cholesterol to Prevent Heart Attacks, strokes  & Dementia .  olmesartan (BENICAR) 20 MG tablet, Take one tablet at Night for blood pressure.  Current Outpatient Medications (Respiratory):  Marland Kitchen  Levocetirizine Dihydrochloride (XYZAL PO), Take 1 tablet by mouth daily. .  Triamcinolone Acetonide (NASACORT AQ NA), Place 1 spray into the nose daily. OTC    Current Outpatient Medications (Other):  .  cholecalciferol (VITAMIN D) 1000 units tablet, Take 1,000 Units by mouth daily. Marland Kitchen   escitalopram (LEXAPRO) 10 MG tablet, Take 1 tablet Daily for Mood & Anxiety .  esomeprazole (NEXIUM) 20 MG capsule, Take 20 mg by mouth daily at 12 noon. .  meclizine (ANTIVERT) 25 MG tablet, Take 1 tablet (25 mg total) by mouth 3 (three) times daily as needed for dizziness. .  ondansetron (ZOFRAN ODT) 8 MG disintegrating tablet, Dissolve 1 tablet under tongue as needed for severe nausea / vomitting   MEDICARE WELLNESS OBJECTIVES: Physical activity: Current Exercise Habits: The patient does not participate in regular exercise at present, Exercise limited by: None identified Cardiac risk factors: Cardiac Risk Factors include: advanced age (>29men, >19 women);dyslipidemia;hypertension;sedentary lifestyle;smoking/ tobacco exposure Depression/mood screen:   Depression screen [facility] 2/9 12/23/2020  Decreased Interest 0  Down, Depressed, Hopeless 0  PHQ - 2 Score 0    ADLs:  In your present state of health, do you have any difficulty performing the following activities: 12/23/2020 04/15/2020  Hearing? N N  Vision? N N  Difficulty concentrating or making decisions? N N  Walking or climbing stairs? N N  Dressing or bathing? N N  Doing errands, shopping? N N  Preparing  Food and eating ? N -  Using the Toilet? N -  In the past six months, have you accidently leaked urine? N -  Do you have problems with loss of bowel control? N -  Managing your Medications? N -  Managing your Finances? N -  Some recent data might be hidden     Cognitive Testing  Alert? Yes  Normal Appearance?Yes  Oriented to person? Yes  Place? Yes   Time? Yes  Recall of three objects?  Yes  Can perform simple calculations? Yes  Displays appropriate judgment?Yes  Can read the correct time from a watch face?Yes  EOL planning: Does Patient Have a Medical Advance Directive?: No Would patient like information on creating a medical advance directive?: No - Patient declined    Health Maintenance:   Immunization History   Administered Date(s) Administered  . Influenza Split 07/10/2014, 08/06/2015  . Influenza Whole 07/06/2007  . Influenza,inj,quad, With Preservative 09/07/2016  . Td 10/05/2001  . Tdap 08/06/2015   Tetanus: 2016 Pneumovax: DUE Flu vaccine: 2020 Shingrix: DUE, discussed with patient today Pap: 2008, declines today but will think about, discussed that if one more was normal would be last one.  SARS-COV2-Pfizer complete 01/2020 MGM:04/2017 given number, Will call to schedule with DEXA  Colonoscopy: refused.  Willing to do cologuard,ordered 01/04/20 PAP: 2008, declines Last Dental Exam:  2017 Last Eye Exam:  [facility]  Patient Care Team: Lucky Cowboy, MD as PCP - General (Internal Medicine)  Medical History:  Past Medical History:  Diagnosis Date  . GERD (gastroesophageal reflux disease)   . HTN (hypertension)   . Unspecified hypothyroidism   . Unspecified vitamin D deficiency    Allergies Allergies  Allergen Reactions  . Codeine     REACTION: hallucinations  . Mometasone Furoate     REACTION: headache  . Zetia [Ezetimibe] Diarrhea and Nausea Only    Prob her sx's were due to IBS & Coincidental     SURGICAL HISTORY She  has no past surgical history on file. FAMILY HISTORY Her family history includes Arthritis in her mother; Breast cancer in her sister; COPD in her mother; Cancer in her sister; Depression in her mother and sister; Heart disease in her father and sister; Hypertension in her mother; Kidney disease in her mother. SOCIAL HISTORY She  reports that she quit smoking about 39 years ago. She has never used smokeless tobacco. She reports that she does not drink alcohol and does not use drugs.  Smoked 10-15 years  Review of Systems: Review of Systems  Constitutional: Negative for chills, diaphoresis, fever, malaise/fatigue and weight loss.  HENT: Negative for congestion, ear discharge, ear pain, hearing loss, nosebleeds, sinus pain, sore throat  and tinnitus.   Eyes: Negative for blurred vision, double vision, photophobia, pain, discharge and redness.  Respiratory: Negative for cough, hemoptysis, sputum production, shortness of breath, wheezing and stridor.   Cardiovascular: Negative for chest pain, palpitations, orthopnea, claudication, leg swelling and PND.  Gastrointestinal: Negative for abdominal pain, blood in stool, constipation, diarrhea, heartburn, melena, nausea and vomiting.  Genitourinary: Negative for dysuria, flank pain, frequency, hematuria and urgency.  Musculoskeletal: Negative for back pain, falls, joint pain, myalgias and neck pain.  Skin: Negative for itching and rash.  Neurological: Negative for dizziness, tingling, tremors, sensory change, speech change, focal weakness, seizures, loss of consciousness, weakness and headaches.  Endo/Heme/Allergies: Negative for environmental allergies and polydipsia. Does not bruise/bleed easily.  Psychiatric/Behavioral: Negative for depression, hallucinations, memory loss, substance abuse and suicidal  ideas. The patient is not nervous/anxious and does not have insomnia.     Physical Exam: Estimated body mass index is 29.95 kg/m as calculated from the following:   Height as of 08/05/20: 5\' 8"  (1.727 m).   Weight as of this encounter: 197 lb (89.4 kg). BP 126/80   Temp 97.6 F (36.4 C)   Wt 197 lb (89.4 kg)   LMP  (LMP Unknown)   BMI 29.95 kg/m   General Appearance: Well nourished well developed, in no apparent distress.  Eyes: PERRLA, EOMs, conjunctiva no swelling or erythema ENT/Mouth: Left Ear canal normal without obstruction,Right ear with some canal redness and swelling.  No left ear swelling, erythema, or discharge.  TMs normal bilaterally with no erythema, bulging, retraction, or loss of landmark.  Oropharynx moist and clear with no exudate, erythema, or swelling.   Neck: Supple, thyroid normal. No bruits.  No cervical adenopathy Respiratory: Respiratory effort normal,  Breath sounds clear A&P without wheeze, rhonchi, rales.   Cardio: RRR without murmurs, rubs or gallops. Brisk peripheral pulses without edema.  Chest: symmetric, with normal excursions Abdomen: Soft, nontender, no guarding, rebound, hernias, masses, or organomegaly.  Lymphatics: Non tender without lymphadenopathy.  Genitourinary: Patient declined pap smear and pelvic exam Musculoskeletal: Full ROM all peripheral extremities,5/5 strength, and normal gait.  Skin: Warm, dry without rashes, lesions, ecchymosis. Neuro: Awake and oriented X 3, Cranial nerves intact, reflexes equal bilaterally. Normal muscle tone, no cerebellar symptoms. Sensation intact.  Psych:  normal affect, Insight and Judgment appropriate.    Medicare Attestation I have personally reviewed: The patient's medical and social history Their use of alcohol, tobacco or illicit drugs Their current medications and supplements The patient's functional ability including ADLs,fall risks, home safety risks, cognitive, and hearing and visual impairment Diet and physical activities Evidence for depression or mood disorders  The patient's weight, height, BMI, and visual acuity have been recorded in the chart.  I have made referrals, counseling, and provided education to the patient based on review of the above and I have provided the patient with a written personalized care plan for preventive services.     Elder NegusKyra Arelis Jensen, Edrick OhAGNP-C, DNP [facility] 12/23/2020  1:20 PM

## 2020-12-23 NOTE — Patient Instructions (Addendum)
Ear care: Both of your ears are clear of wax at this time Use 2-3 drops of oil for next 3-4 nights.   You can use olive oil or mineral oil This will help to sooth your ear canals.  Use cotton ball to keep in ears while sleeping.  For regular maintenance: Use warm or room temp peroxide in ear once every 1-2 weeks, then apply oil that night x1.   Next morning let warm water from shower run into your ears.   This will help flush out any wax Do not use cotton swabs in your ears Should you have pain, decrease in your ability to hear or dizziness please contact the office for an appointment.    You are due for mammogram and Bone Density (DEXA scran).  You can schedule these together at the breast center.  HOW TO SCHEDULE Mammogram, Bone Density, DEXA  [facility]  7 a.m.-6:30 p.m., Monday 7 a.m.-5 p.m., Tuesday-Friday Schedule an appointment by calling [phone].    You are due for Pneumonia vaccine and Shingrix (shingles vaccination)  Below is information on Shingles vaccine.  Ask insurance and pharmacy about shingrix - it is a 2 part shot that we will not be getting in the office.   Suggest getting AFTER covid vaccines, have to wait at least a month This shot can make you feel bad due to such good immune response it can trigger some inflammation so take tylenol or aleve day of or day after and plan on resting.   Can go to [email or web address] for more information  Shingrix Vaccination  Two vaccines are licensed and recommended to prevent shingles in the [place] Zoster vaccine live (ZVL, Zostavax) has been in use since 2006. Recombinant zoster vaccine (RZV, Shingrix), has been in use since 2017 and is recommended by AC[employer] as the preferred shingles vaccine.  What Everyone Should Know about Shingles Vaccine (Shingrix) One of the Recommended Vaccines by Disease Shingles vaccination is the only way  to protect against shingles and postherpetic neuralgia (PHN), the most common complication from shingles. CDC recommends that healthy adults 50 years and older get two doses of the shingles vaccine called Shingrix (recombinant zoster vaccine), separated by 2 to 6 months, to prevent shingles and the complications from the disease. Your doctor or pharmacist can give you Shingrix as a shot in your upper arm. Shingrix provides strong protection against shingles and PHN. Two doses of Shingrix is more than 90% effective at preventing shingles and PHN. Protection stays above 85% for at least the first four years after you get vaccinated. Shingrix is the preferred vaccine, over Zostavax (zoster vaccine live), a shingles vaccine in use since 2006. Zostavax may still be used to prevent shingles in healthy adults 60 years and older. For example, you could use Zostavax if a person is allergic to Shingrix, prefers Zostavax, or requests immediate vaccination and Shingrix is unavailable. Who Should Get Shingrix? Healthy adults 50 years and older should get two doses of Shingrix, separated by 2 to 6 months. You should get Shingrix even if in the past you . had shingles  . received Zostavax  . are not sure if you had chickenpox There is no maximum age for getting Shingrix. If you had shingles in the past, you can get Shingrix to help prevent future occurrences of the disease. There is no specific length of time that you need to wait after having shingles before you can receive Shingrix, but generally you  should make sure the shingles rash has gone away before getting vaccinated. You can get Shingrix whether or not you remember having had chickenpox in the past. Studies show that more than 99% of Americans 40 years and older have had chickenpox, even if they don't remember having the disease. Chickenpox and shingles are related because they are caused by the same virus (varicella zoster virus). After a person recovers from  chickenpox, the virus stays dormant (inactive) in the body. It can reactivate years later and cause shingles. If you had Zostavax in the recent past, you should wait at least eight weeks before getting Shingrix. Talk to your healthcare provider to determine the best time to get Shingrix. Shingrix is available in [employer] and pharmacies. To find doctor's offices or pharmacies near you that offer the vaccine, visit HealthMap Vaccine FinderExternal. If you have questions about Shingrix, talk with your healthcare provider. Vaccine for Those 50 Years and Older  Shingrix reduces the risk of shingles and PHN by more than 90% in people 79 and older. CDC recommends the vaccine for healthy adults 50 and older.  Who Should Not Get Shingrix? You should not get Shingrix if you: . have ever had a severe allergic reaction to any component of the vaccine or after a dose of Shingrix  . tested negative for immunity to varicella zoster virus. If you test negative, you should get chickenpox vaccine.  . currently have shingles  . currently are pregnant or breastfeeding. Women who are pregnant or breastfeeding should wait to get Shingrix.  Marland Kitchen receive specific antiviral drugs (acyclovir, famciclovir, or valacyclovir) 24 hours before vaccination (avoid use of these antiviral drugs for 14 days after vaccination)- zoster vaccine live only If you have a minor acute (starts suddenly) illness, such as a cold, you may get Shingrix. But if you have a moderate or severe acute illness, you should usually wait until you recover before getting the vaccine. This includes anyone with a temperature of 101.87F or higher. The side effects of the Shingrix are temporary, and usually last 2 to 3 days. While you may experience pain for a few days after getting Shingrix, the pain will be less severe than having shingles and the complications from the disease. How Well Does Shingrix Work? Two doses of Shingrix provides strong protection  against shingles and postherpetic neuralgia (PHN), the most common complication of shingles. . In adults 24 to 66 years old who got two doses, Shingrix was 97% effective in preventing shingles; among adults 70 years and older, Shingrix was 91% effective.  . In adults 67 to 66 years old who got two doses, Shingrix was 91% effective in preventing PHN; among adults 70 years and older, Shingrix was 89% effective. Shingrix protection remained high (more than 85%) in people 70 years and older throughout the four years following vaccination. Since your risk of shingles and PHN increases as you get older, it is important to have strong protection against shingles in your older years. Top of Page  What Are the Possible Side Effects of Shingrix? Studies show that Shingrix is safe. The vaccine helps your body create a strong defense against shingles. As a result, you are likely to have temporary side effects from getting the shots. The side effects may affect your ability to do normal daily activities for 2 to 3 days. Most people got a sore arm with mild or moderate pain after getting Shingrix, and some also had redness and swelling where they got the shot. Some  people felt tired, had muscle pain, a headache, shivering, fever, stomach pain, or nausea. About 1 out of 6 people who got Shingrix experienced side effects that prevented them from doing regular activities. Symptoms went away on their own in about 2 to 3 days. Side effects were more common in younger people. You might have a reaction to the first or second dose of Shingrix, or both doses. If you experience side effects, you may choose to take over-the-counter pain medicine such as ibuprofen or acetaminophen. If you experience side effects from Shingrix, you should report them to the Vaccine Adverse Event Reporting System (VAERS). Your doctor might file this report, or you can do it yourself through the VAERS websiteExternal, or by calling [phone]. If  you have any questions about side effects from Shingrix, talk with your doctor. The shingles vaccine does not contain thimerosal (a preservative containing mercury). Top of Page  When Should I See a Doctor Because of the Side Effects I Experience From Shingrix? In clinical trials, Shingrix was not associated with serious adverse events. In fact, serious side effects from vaccines are extremely rare. For example, for every 1 million doses of a vaccine given, only one or two people may have a severe allergic reaction. Signs of an allergic reaction happen within minutes or hours after vaccination and include hives, swelling of the face and throat, difficulty breathing, a fast heartbeat, dizziness, or weakness. If you experience these or any other life-threatening symptoms, see a doctor right away. Shingrix causes a strong response in your immune system, so it may produce short-term side effects more intense than you are used to from other vaccines. These side effects can be uncomfortable, but they are expected and usually go away on their own in 2 or 3 days. Top of Page  How Can I Pay For Shingrix? There are several ways shingles vaccine may be paid for: Medicare . Medicare Part D plans cover the shingles vaccine, but there may be a cost to you depending on your plan. There may be a copay for the vaccine, or you may need to pay in full then get reimbursed for a certain amount.  . Medicare Part B does not cover the shingles vaccine. Medicaid . Medicaid may or may not cover the vaccine. Contact your insurer to find out. Private health insurance . Many private health insurance plans will cover the vaccine, but there may be a cost to you depending on your plan. Contact your insurer to find out. Vaccine assistance programs . Some pharmaceutical companies provide vaccines to eligible adults who cannot afford them. You may want to check with the vaccine manufacturer, [employer], about Shingrix. If you  do not currently have health insurance, learn more about affordable health coverage optionsExternal. To find doctor's offices or pharmacies near you that offer the vaccine, visit HealthMap Vaccine FinderExternal.

## 2020-12-24 DIAGNOSIS — M9903 Segmental and somatic dysfunction of lumbar region: Secondary | ICD-10-CM | POA: Diagnosis not present

## 2020-12-24 DIAGNOSIS — M5136 Other intervertebral disc degeneration, lumbar region: Secondary | ICD-10-CM | POA: Diagnosis not present

## 2020-12-24 DIAGNOSIS — M5134 Other intervertebral disc degeneration, thoracic region: Secondary | ICD-10-CM | POA: Diagnosis not present

## 2020-12-24 DIAGNOSIS — M9902 Segmental and somatic dysfunction of thoracic region: Secondary | ICD-10-CM | POA: Diagnosis not present

## 2020-12-24 LAB — COMPLETE METABOLIC PANEL WITH GFR
AG Ratio: 1.2 (calc) (ref 1.0–2.5)
ALT: 13 U/L (ref 6–29)
AST: 19 U/L (ref 10–35)
Albumin: 4.3 g/dL (ref 3.6–5.1)
Alkaline phosphatase (APISO): 113 U/L (ref 37–153)
BUN: 14 mg/dL (ref 7–25)
CO2: 27 mmol/L (ref 20–32)
Calcium: 9.6 mg/dL (ref 8.6–10.4)
Chloride: 103 mmol/L (ref 98–110)
Creat: 0.8 mg/dL (ref 0.50–0.99)
GFR, Est African American: 90 mL/min/{1.73_m2} (ref >=60)
GFR, Est Non African American: 77 mL/min/{1.73_m2} (ref >=60)
Globulin: 3.5 g/dL (calc) (ref 1.9–3.7)
Glucose, Bld: 81 mg/dL (ref 65–99)
Potassium: 4.3 mmol/L (ref 3.5–5.3)
Sodium: 142 mmol/L (ref 135–146)
Total Bilirubin: 0.5 mg/dL (ref 0.2–1.2)
Total Protein: 7.8 g/dL (ref 6.1–8.1)

## 2020-12-24 LAB — CBC WITH DIFFERENTIAL/PLATELET
Absolute Monocytes: 503 cells/uL (ref 200–950)
Basophils Absolute: 60 cells/uL (ref 0–200)
Basophils Relative: 0.9 %
Eosinophils Absolute: 107 cells/uL (ref 15–500)
Eosinophils Relative: 1.6 %
HCT: 41.7 % (ref 35.0–45.0)
Hemoglobin: 14 g/dL (ref 11.7–15.5)
Lymphs Abs: 2097 cells/uL (ref 850–3900)
MCH: 30 pg (ref 27.0–33.0)
MCHC: 33.6 g/dL (ref 32.0–36.0)
MCV: 89.5 fL (ref 80.0–100.0)
MPV: 9.1 fL (ref 7.5–12.5)
Monocytes Relative: 7.5 %
Neutro Abs: 3933 cells/uL (ref 1500–7800)
Neutrophils Relative %: 58.7 %
Platelets: 281 10*3/uL (ref 140–400)
RBC: 4.66 10*6/uL (ref 3.80–5.10)
RDW: 13.7 % (ref 11.0–15.0)
Total Lymphocyte: 31.3 %
WBC: 6.7 10*3/uL (ref 3.8–10.8)

## 2020-12-24 LAB — LIPID PANEL
Cholesterol: 176 mg/dL (ref <200)
HDL: 57 mg/dL (ref >=50)
LDL Cholesterol (Calc): 88 mg/dL (calc)
Non-HDL Cholesterol (Calc): 119 mg/dL (calc) (ref <130)
Total CHOL/HDL Ratio: 3.1 (calc) (ref <5.0)
Triglycerides: 221 mg/dL — ABNORMAL HIGH (ref <150)

## 2020-12-24 LAB — TSH: TSH: 2.01 mIU/L (ref 0.40–4.50)

## 2020-12-31 DIAGNOSIS — M5136 Other intervertebral disc degeneration, lumbar region: Secondary | ICD-10-CM | POA: Diagnosis not present

## 2020-12-31 DIAGNOSIS — M9903 Segmental and somatic dysfunction of lumbar region: Secondary | ICD-10-CM | POA: Diagnosis not present

## 2020-12-31 DIAGNOSIS — M9902 Segmental and somatic dysfunction of thoracic region: Secondary | ICD-10-CM | POA: Diagnosis not present

## 2020-12-31 DIAGNOSIS — M5134 Other intervertebral disc degeneration, thoracic region: Secondary | ICD-10-CM | POA: Diagnosis not present

## 2021-01-06 DIAGNOSIS — M9902 Segmental and somatic dysfunction of thoracic region: Secondary | ICD-10-CM | POA: Diagnosis not present

## 2021-01-06 DIAGNOSIS — M5134 Other intervertebral disc degeneration, thoracic region: Secondary | ICD-10-CM | POA: Diagnosis not present

## 2021-01-06 DIAGNOSIS — M5136 Other intervertebral disc degeneration, lumbar region: Secondary | ICD-10-CM | POA: Diagnosis not present

## 2021-01-06 DIAGNOSIS — M9903 Segmental and somatic dysfunction of lumbar region: Secondary | ICD-10-CM | POA: Diagnosis not present

## 2021-01-09 DIAGNOSIS — M5136 Other intervertebral disc degeneration, lumbar region: Secondary | ICD-10-CM | POA: Diagnosis not present

## 2021-01-09 DIAGNOSIS — M9905 Segmental and somatic dysfunction of pelvic region: Secondary | ICD-10-CM | POA: Diagnosis not present

## 2021-01-09 DIAGNOSIS — M9903 Segmental and somatic dysfunction of lumbar region: Secondary | ICD-10-CM | POA: Diagnosis not present

## 2021-01-09 DIAGNOSIS — M9904 Segmental and somatic dysfunction of sacral region: Secondary | ICD-10-CM | POA: Diagnosis not present

## 2021-01-16 DIAGNOSIS — M9903 Segmental and somatic dysfunction of lumbar region: Secondary | ICD-10-CM | POA: Diagnosis not present

## 2021-01-16 DIAGNOSIS — M5136 Other intervertebral disc degeneration, lumbar region: Secondary | ICD-10-CM | POA: Diagnosis not present

## 2021-01-16 DIAGNOSIS — M9904 Segmental and somatic dysfunction of sacral region: Secondary | ICD-10-CM | POA: Diagnosis not present

## 2021-01-16 DIAGNOSIS — M9905 Segmental and somatic dysfunction of pelvic region: Secondary | ICD-10-CM | POA: Diagnosis not present

## 2021-01-21 DIAGNOSIS — M9904 Segmental and somatic dysfunction of sacral region: Secondary | ICD-10-CM | POA: Diagnosis not present

## 2021-01-21 DIAGNOSIS — M9905 Segmental and somatic dysfunction of pelvic region: Secondary | ICD-10-CM | POA: Diagnosis not present

## 2021-01-21 DIAGNOSIS — M5136 Other intervertebral disc degeneration, lumbar region: Secondary | ICD-10-CM | POA: Diagnosis not present

## 2021-01-21 DIAGNOSIS — M9903 Segmental and somatic dysfunction of lumbar region: Secondary | ICD-10-CM | POA: Diagnosis not present

## 2021-01-28 DIAGNOSIS — M9904 Segmental and somatic dysfunction of sacral region: Secondary | ICD-10-CM | POA: Diagnosis not present

## 2021-01-28 DIAGNOSIS — M9903 Segmental and somatic dysfunction of lumbar region: Secondary | ICD-10-CM | POA: Diagnosis not present

## 2021-01-28 DIAGNOSIS — M9905 Segmental and somatic dysfunction of pelvic region: Secondary | ICD-10-CM | POA: Diagnosis not present

## 2021-01-28 DIAGNOSIS — M5136 Other intervertebral disc degeneration, lumbar region: Secondary | ICD-10-CM | POA: Diagnosis not present

## 2021-02-04 ENCOUNTER — Other Ambulatory Visit: Payer: Self-pay | Admitting: Adult Health

## 2021-02-04 DIAGNOSIS — E039 Hypothyroidism, unspecified: Secondary | ICD-10-CM

## 2021-03-10 ENCOUNTER — Telehealth: Payer: Self-pay

## 2021-03-10 ENCOUNTER — Other Ambulatory Visit: Payer: Self-pay | Admitting: Internal Medicine

## 2021-03-10 MED ORDER — BENZONATATE 200 MG PO CAPS: ORAL_CAPSULE | ORAL | 1 refills | Status: DC

## 2021-03-10 MED ORDER — AZITHROMYCIN 250 MG PO TABS: ORAL_TABLET | ORAL | 1 refills | Status: DC

## 2021-03-10 MED ORDER — DEXAMETHASONE 4 MG PO TABS: ORAL_TABLET | ORAL | 0 refills | Status: DC

## 2021-03-10 NOTE — Telephone Encounter (Signed)
Has been sick for 9 days, with drainage, coughing, green phlegm, chest congestion. No fever just very tired. Last Covid test was 4 days ago and results were negative. Wanting to come in but no appointments available. Please advise.

## 2021-03-18 DIAGNOSIS — M9902 Segmental and somatic dysfunction of thoracic region: Secondary | ICD-10-CM | POA: Diagnosis not present

## 2021-03-18 DIAGNOSIS — M5136 Other intervertebral disc degeneration, lumbar region: Secondary | ICD-10-CM | POA: Diagnosis not present

## 2021-03-18 DIAGNOSIS — M5134 Other intervertebral disc degeneration, thoracic region: Secondary | ICD-10-CM | POA: Diagnosis not present

## 2021-03-18 DIAGNOSIS — M9903 Segmental and somatic dysfunction of lumbar region: Secondary | ICD-10-CM | POA: Diagnosis not present

## 2021-04-02 DIAGNOSIS — M9902 Segmental and somatic dysfunction of thoracic region: Secondary | ICD-10-CM | POA: Diagnosis not present

## 2021-04-02 DIAGNOSIS — M9903 Segmental and somatic dysfunction of lumbar region: Secondary | ICD-10-CM | POA: Diagnosis not present

## 2021-04-02 DIAGNOSIS — M5136 Other intervertebral disc degeneration, lumbar region: Secondary | ICD-10-CM | POA: Diagnosis not present

## 2021-04-02 DIAGNOSIS — M5134 Other intervertebral disc degeneration, thoracic region: Secondary | ICD-10-CM | POA: Diagnosis not present

## 2021-04-15 DIAGNOSIS — M5134 Other intervertebral disc degeneration, thoracic region: Secondary | ICD-10-CM | POA: Diagnosis not present

## 2021-04-15 DIAGNOSIS — M9903 Segmental and somatic dysfunction of lumbar region: Secondary | ICD-10-CM | POA: Diagnosis not present

## 2021-04-15 DIAGNOSIS — M5136 Other intervertebral disc degeneration, lumbar region: Secondary | ICD-10-CM | POA: Diagnosis not present

## 2021-04-15 DIAGNOSIS — M9902 Segmental and somatic dysfunction of thoracic region: Secondary | ICD-10-CM | POA: Diagnosis not present

## 2021-04-23 DIAGNOSIS — M9902 Segmental and somatic dysfunction of thoracic region: Secondary | ICD-10-CM | POA: Diagnosis not present

## 2021-04-23 DIAGNOSIS — M9903 Segmental and somatic dysfunction of lumbar region: Secondary | ICD-10-CM | POA: Diagnosis not present

## 2021-04-23 DIAGNOSIS — M5136 Other intervertebral disc degeneration, lumbar region: Secondary | ICD-10-CM | POA: Diagnosis not present

## 2021-04-23 DIAGNOSIS — M5134 Other intervertebral disc degeneration, thoracic region: Secondary | ICD-10-CM | POA: Diagnosis not present

## 2021-05-13 DIAGNOSIS — M9903 Segmental and somatic dysfunction of lumbar region: Secondary | ICD-10-CM | POA: Diagnosis not present

## 2021-05-13 DIAGNOSIS — M5134 Other intervertebral disc degeneration, thoracic region: Secondary | ICD-10-CM | POA: Diagnosis not present

## 2021-05-13 DIAGNOSIS — M5136 Other intervertebral disc degeneration, lumbar region: Secondary | ICD-10-CM | POA: Diagnosis not present

## 2021-05-13 DIAGNOSIS — M9902 Segmental and somatic dysfunction of thoracic region: Secondary | ICD-10-CM | POA: Diagnosis not present

## 2021-05-14 ENCOUNTER — Other Ambulatory Visit: Payer: Self-pay | Admitting: Adult Health

## 2021-05-14 DIAGNOSIS — E039 Hypothyroidism, unspecified: Secondary | ICD-10-CM

## 2021-05-22 DIAGNOSIS — M5136 Other intervertebral disc degeneration, lumbar region: Secondary | ICD-10-CM | POA: Diagnosis not present

## 2021-05-22 DIAGNOSIS — M5134 Other intervertebral disc degeneration, thoracic region: Secondary | ICD-10-CM | POA: Diagnosis not present

## 2021-05-22 DIAGNOSIS — M9902 Segmental and somatic dysfunction of thoracic region: Secondary | ICD-10-CM | POA: Diagnosis not present

## 2021-05-22 DIAGNOSIS — M9903 Segmental and somatic dysfunction of lumbar region: Secondary | ICD-10-CM | POA: Diagnosis not present

## 2021-05-28 DIAGNOSIS — M5136 Other intervertebral disc degeneration, lumbar region: Secondary | ICD-10-CM | POA: Diagnosis not present

## 2021-05-28 DIAGNOSIS — M9903 Segmental and somatic dysfunction of lumbar region: Secondary | ICD-10-CM | POA: Diagnosis not present

## 2021-05-28 DIAGNOSIS — M9905 Segmental and somatic dysfunction of pelvic region: Secondary | ICD-10-CM | POA: Diagnosis not present

## 2021-05-28 DIAGNOSIS — M9904 Segmental and somatic dysfunction of sacral region: Secondary | ICD-10-CM | POA: Diagnosis not present

## 2021-06-03 DIAGNOSIS — M9904 Segmental and somatic dysfunction of sacral region: Secondary | ICD-10-CM | POA: Diagnosis not present

## 2021-06-03 DIAGNOSIS — M9905 Segmental and somatic dysfunction of pelvic region: Secondary | ICD-10-CM | POA: Diagnosis not present

## 2021-06-03 DIAGNOSIS — M5136 Other intervertebral disc degeneration, lumbar region: Secondary | ICD-10-CM | POA: Diagnosis not present

## 2021-06-03 DIAGNOSIS — M9903 Segmental and somatic dysfunction of lumbar region: Secondary | ICD-10-CM | POA: Diagnosis not present

## 2021-06-11 DIAGNOSIS — M5136 Other intervertebral disc degeneration, lumbar region: Secondary | ICD-10-CM | POA: Diagnosis not present

## 2021-06-11 DIAGNOSIS — M9903 Segmental and somatic dysfunction of lumbar region: Secondary | ICD-10-CM | POA: Diagnosis not present

## 2021-06-11 DIAGNOSIS — M9905 Segmental and somatic dysfunction of pelvic region: Secondary | ICD-10-CM | POA: Diagnosis not present

## 2021-06-11 DIAGNOSIS — M9904 Segmental and somatic dysfunction of sacral region: Secondary | ICD-10-CM | POA: Diagnosis not present

## 2021-06-25 DIAGNOSIS — M9904 Segmental and somatic dysfunction of sacral region: Secondary | ICD-10-CM | POA: Diagnosis not present

## 2021-06-25 DIAGNOSIS — M9905 Segmental and somatic dysfunction of pelvic region: Secondary | ICD-10-CM | POA: Diagnosis not present

## 2021-06-25 DIAGNOSIS — M9903 Segmental and somatic dysfunction of lumbar region: Secondary | ICD-10-CM | POA: Diagnosis not present

## 2021-06-25 DIAGNOSIS — M5136 Other intervertebral disc degeneration, lumbar region: Secondary | ICD-10-CM | POA: Diagnosis not present

## 2021-07-17 DIAGNOSIS — M9903 Segmental and somatic dysfunction of lumbar region: Secondary | ICD-10-CM | POA: Diagnosis not present

## 2021-07-17 DIAGNOSIS — M9905 Segmental and somatic dysfunction of pelvic region: Secondary | ICD-10-CM | POA: Diagnosis not present

## 2021-07-17 DIAGNOSIS — M5136 Other intervertebral disc degeneration, lumbar region: Secondary | ICD-10-CM | POA: Diagnosis not present

## 2021-07-17 DIAGNOSIS — M9904 Segmental and somatic dysfunction of sacral region: Secondary | ICD-10-CM | POA: Diagnosis not present

## 2021-07-24 DIAGNOSIS — M9904 Segmental and somatic dysfunction of sacral region: Secondary | ICD-10-CM | POA: Diagnosis not present

## 2021-07-24 DIAGNOSIS — M9905 Segmental and somatic dysfunction of pelvic region: Secondary | ICD-10-CM | POA: Diagnosis not present

## 2021-07-24 DIAGNOSIS — M9903 Segmental and somatic dysfunction of lumbar region: Secondary | ICD-10-CM | POA: Diagnosis not present

## 2021-07-24 DIAGNOSIS — M5136 Other intervertebral disc degeneration, lumbar region: Secondary | ICD-10-CM | POA: Diagnosis not present

## 2021-07-30 DIAGNOSIS — M9905 Segmental and somatic dysfunction of pelvic region: Secondary | ICD-10-CM | POA: Diagnosis not present

## 2021-07-30 DIAGNOSIS — M9904 Segmental and somatic dysfunction of sacral region: Secondary | ICD-10-CM | POA: Diagnosis not present

## 2021-07-30 DIAGNOSIS — M9903 Segmental and somatic dysfunction of lumbar region: Secondary | ICD-10-CM | POA: Diagnosis not present

## 2021-07-30 DIAGNOSIS — M5136 Other intervertebral disc degeneration, lumbar region: Secondary | ICD-10-CM | POA: Diagnosis not present

## 2021-08-05 ENCOUNTER — Encounter: Payer: Medicare HMO | Admitting: Adult Health Nurse Practitioner

## 2021-08-14 ENCOUNTER — Ambulatory Visit (INDEPENDENT_AMBULATORY_CARE_PROVIDER_SITE_OTHER): Payer: Medicare HMO | Admitting: Adult Health

## 2021-08-14 ENCOUNTER — Other Ambulatory Visit: Payer: Self-pay

## 2021-08-14 ENCOUNTER — Encounter: Payer: Self-pay | Admitting: Adult Health

## 2021-08-14 VITALS — BP 110/74 | HR 70 | Temp 97.9°F | Ht 66.0 in | Wt 198.0 lb

## 2021-08-14 DIAGNOSIS — Z136 Encounter for screening for cardiovascular disorders: Secondary | ICD-10-CM | POA: Diagnosis not present

## 2021-08-14 DIAGNOSIS — I10 Essential (primary) hypertension: Secondary | ICD-10-CM

## 2021-08-14 DIAGNOSIS — Z0001 Encounter for general adult medical examination with abnormal findings: Secondary | ICD-10-CM | POA: Diagnosis not present

## 2021-08-14 DIAGNOSIS — Z0000 Encounter for general adult medical examination without abnormal findings: Secondary | ICD-10-CM

## 2021-08-14 DIAGNOSIS — Z23 Encounter for immunization: Secondary | ICD-10-CM

## 2021-08-14 DIAGNOSIS — I447 Left bundle-branch block, unspecified: Secondary | ICD-10-CM

## 2021-08-14 DIAGNOSIS — F419 Anxiety disorder, unspecified: Secondary | ICD-10-CM

## 2021-08-14 DIAGNOSIS — R7309 Other abnormal glucose: Secondary | ICD-10-CM

## 2021-08-14 DIAGNOSIS — E559 Vitamin D deficiency, unspecified: Secondary | ICD-10-CM | POA: Diagnosis not present

## 2021-08-14 DIAGNOSIS — Z79899 Other long term (current) drug therapy: Secondary | ICD-10-CM

## 2021-08-14 DIAGNOSIS — E663 Overweight: Secondary | ICD-10-CM | POA: Insufficient documentation

## 2021-08-14 DIAGNOSIS — E2839 Other primary ovarian failure: Secondary | ICD-10-CM

## 2021-08-14 DIAGNOSIS — H6122 Impacted cerumen, left ear: Secondary | ICD-10-CM

## 2021-08-14 DIAGNOSIS — E039 Hypothyroidism, unspecified: Secondary | ICD-10-CM | POA: Diagnosis not present

## 2021-08-14 DIAGNOSIS — Z1231 Encounter for screening mammogram for malignant neoplasm of breast: Secondary | ICD-10-CM

## 2021-08-14 DIAGNOSIS — E782 Mixed hyperlipidemia: Secondary | ICD-10-CM | POA: Diagnosis not present

## 2021-08-14 DIAGNOSIS — Z1211 Encounter for screening for malignant neoplasm of colon: Secondary | ICD-10-CM

## 2021-08-14 DIAGNOSIS — R002 Palpitations: Secondary | ICD-10-CM

## 2021-08-14 DIAGNOSIS — Z131 Encounter for screening for diabetes mellitus: Secondary | ICD-10-CM | POA: Diagnosis not present

## 2021-08-14 DIAGNOSIS — Z1389 Encounter for screening for other disorder: Secondary | ICD-10-CM | POA: Diagnosis not present

## 2021-08-14 MED ORDER — DOXYCYCLINE HYCLATE 100 MG PO CAPS: ORAL_CAPSULE | ORAL | 0 refills | Status: DC

## 2021-08-14 MED ORDER — DEXAMETHASONE 4 MG PO TABS: ORAL_TABLET | ORAL | 0 refills | Status: DC

## 2021-08-14 NOTE — Progress Notes (Signed)
CPE  Assessment / Plan:    Denise Jensen was seen today for annual exam.  Diagnoses and all orders for this visit:  Encounter for Annual Physical Exam with abnormal findings Due annually  Health Maintenance reviewed Healthy lifestyle reviewed and goals set Mammogram ordered and phone number given to schedule  LBBB (left bundle branch block) Denies any concerning sx; est with cardiology if needed  Monitor closely, reduce BB if needed - EKG  Hypothyroidism, unspecified type Taking levothyroxine 112 mcg whote tablet 5 days a week and half tablet 2 days a week. Reminder to take on an empty stomach 30-79mins before first meal of the day. No antacid medications for 4 hours. -     TSH  Essential hypertension Continue current medications] Monitor blood pressure at home; call if consistently over 130/80 Continue DASH diet.   Reminder to go to the ER if any CP, SOB, nausea, dizziness, severe HA, changes vision/speech, left arm numbness and tingling and jaw pain.  Hyperlipidemia, mixed Zetia 10mg .  Discussed dietary and exercise modifications Low fat diet -     Lipid panel  Vitamin D deficiency Continue supplementation to maintain goal of 70-100 Taking ? 5000 IU daily - verify -     VITAMIN D 25 Hydroxy (Vit-D Deficiency, Fractures)  Fibromyalgia Lifestyle reivewed, doing fairly   Allergic rhinitis due to pollen, unspecified seasonality Currently with flare; continue antihistamine, rotate q4-28months Add steroid taper, do saline irrigations, flonase, hygiene reviewed - Doxycycline given for prolonged sinusitis type sx Follow up in office if not resolving.   Obesity - BMI 31 Long discussion about weight loss, diet, and exercise Recommended diet heavy in fruits and veggies and low in animal meats, cheeses, and dairy products, appropriate calorie intake Patient will work on adding resistance exercises to build muscle mass, continue portion control, avoid processed carbs Discussed  appropriate weight for height  Follow up at next visit  Estrogen deficiency -     DG Bone Density; Future - patient declines  Abnormal glucose -     Hemoglobin A1c  Chronic anxiety Doing well, never started lexapro Stress management techniques discussed, increase water, good sleep hygiene discussed, increase exercise, and increase veggies.   Medication Management Continued  Left ear impaction - stop using Qtips, irrigation used in the office without complications, use OTC drops/oil at home to prevent reoccurence   Orders Placed This Encounter  Procedures   MM Digital Screening   Flu vaccine HIGH DOSE PF   CBC with Differential/Platelet   COMPLETE METABOLIC PANEL WITH GFR   Magnesium   Lipid panel   TSH   Hemoglobin A1c   VITAMIN D 25 Hydroxy (Vit-D Deficiency, Fractures)   Microalbumin / creatinine urine ratio   Urinalysis, Routine w reflex microscopic   Cologuard   EKG 12-Lead     Further disposition pending results if labs check today. Discussed med's effects and SE's.   Over 40 minutes of face to face interview, exam, counseling, chart review, and critical decision making was performed.   Future Appointments  Date Time Provider Department Center  12/23/2021 11:00 AM 12/25/2021, NP [facility] None  08/18/2022  3:00 PM 08/20/2022, NP [facility] None     Plan:   During the course of the visit the patient was educated and counseled about appropriate screening and preventive services including:   Pneumococcal vaccine  Prevnar 13 Influenza vaccine Td vaccine Screening electrocardiogram Bone densitometry screening Colorectal cancer screening Diabetes screening Glaucoma screening Nutrition counseling  Advanced directives: requested   --------------------------------------------------------------------------------------------------  HPI  65 y.o. female  presents for CPE. She has Hypothyroidism; ALLERGIC RHINITIS, SEASONAL; DEGENERATION,  LUMBAR/LUMBOSACRAL DISC; Fibromyalgia; Palpitations; Vitamin D deficiency; Mixed hyperlipidemia; Medication management; White coat syndrome with hypertension; LBBB (left bundle branch block); Chronic anxiety; HTN (hypertension); and Overweight (BMI 25.0-29.9) on their problem list.  Married, 46 years, 1 child, 2 grandchildren in [place]. 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   Personal history of COVID19 09/2020 and monoclonal antibody infusion.  She reports was improving overall, but in May had a few days where she felt ill, since then has persistent copious amounts of nasal discharge, perstent dry cough, started on allegra with some improvement, recently switched to a generic alternative h2i, has seen some improvement but still persistent. Has completed zpak that helped some.   She has chronic pain bilateral shoulders, bil foot pain, has seen rheumatoid in the past per patient, doing well at this time. Tylenol arthritis and voltaren helps.   BMI is Body mass index is 31.96 kg/m., she has been working on diet, will do smaller servings. Admits hasn't been exercising but thinks she needs to restart walking to help with weight. Is active in home and garden.  Wt Readings from Last 3 Encounters:  08/14/21 198 lb (89.8 kg)  12/23/20 197 lb (89.4 kg)  08/05/20 195 lb (88.5 kg)   She has LBBB, had exertional dyspnea in 2019 and saw cardiology Dr. Henrietta Hoover, ECHO 01/03/2018 showed LVEF 45-50% with hypokinesis/inferior/septal walls, mild LVH, grade 1 diastolic dysfunction, mild MR.   Her blood pressure has been controlled at home, today their BP is BP: 110/74.   She does not workout. She denies chest pain, shortness of breath, dizziness.    She is on cholesterol medication and taking zetia 10 mg daily. Her LDL cholesterol is at goal. The cholesterol last visit was:  Lab Results  Component Value Date   CHOL 176 12/23/2020   HDL 57 12/23/2020   LDLCALC 88 12/23/2020   LDLDIRECT 75.3  09/11/2010   TRIG 221 (H) 12/23/2020   CHOLHDL 3.1 12/23/2020  . Last A1C in the office was:  Lab Results  Component Value Date   HGBA1C 5.5 08/05/2020   Patient is on Vitamin D supplement, she reduced by half since 2021, ? 5000 IU Lab Results  Component Value Date   VD25OH 103 (H) 08/05/2020     She is on thyroid medication. Her medication was not changed last visit: Levothyroxine 112 mcg whole tablet five days a week and half tablet two days a week.  Asymptomatic today.  Lab Results  Component Value Date   TSH 2.01 12/23/2020     Current Medications:   Current Outpatient Medications (Endocrine & Metabolic):    levothyroxine (SYNTHROID) 112 MCG tablet, TAKE 1 TABLET DAILY ON AN EMPTY STOMACH WITH ONLY WATER FOR 30 MINUTES & NO ANTACID MEDS, CALCIUM OR MAGNESIUM FOR 4 HOURS & AVOID BIOTIN   dexamethasone (DECADRON) 4 MG tablet, Take 1 tab 3 x /day for 2 days,      then 2 x /day for 2  Days,     then 1 tab daily  Current Outpatient Medications (Cardiovascular):    bisoprolol-hydrochlorothiazide (ZIAC) 5-6.25 MG tablet, TAKE 1 TABLET DAILY FOR BLOOD PRESSURE.   ezetimibe (ZETIA) 10 MG tablet, Take 1 tablet Daily for Cholesterol to Prevent Heart Attacks, strokes  & Dementia   olmesartan (BENICAR) 20 MG tablet, Take one tablet at Night for blood pressure.  Current Outpatient Medications (Respiratory):    Levocetirizine  Dihydrochloride (XYZAL PO), Take 1 tablet by mouth daily.   Triamcinolone Acetonide (NASACORT AQ NA), Place 1 spray into the nose daily as needed. OTC    Current Outpatient Medications (Other):    cholecalciferol (VITAMIN D) 1000 units tablet, Take 1,000 Units by mouth daily.   doxycycline (VIBRAMYCIN) 100 MG capsule, Take 1 capsule twice daily with food   esomeprazole (NEXIUM) 20 MG capsule, Take 20 mg by mouth daily at 12 noon.   meclizine (ANTIVERT) 25 MG tablet, Take 1 tablet (25 mg total) by mouth 3 (three) times daily as needed for dizziness. (Patient not  taking: Reported on 08/14/2021)   ondansetron (ZOFRAN ODT) 8 MG disintegrating tablet, Dissolve 1 tablet under tongue as needed for severe nausea / vomitting (Patient not taking: Reported on 08/14/2021)  Health Maintenance:   Immunization History  Administered Date(s) Administered   Influenza Split 07/10/2014, 08/06/2015   Influenza Whole 07/06/2007   Influenza, High Dose Seasonal PF 08/14/2021   Influenza,inj,quad, With Preservative 09/07/2016   Td 10/05/2001   Tdap 08/06/2015   Tetanus: 2016 Pneumovax: declines to next OV Flu vaccine: 2020, TODAY  Shingrix: check with insurance  Covid 19: 2/2, 2021, moderna, requested date  Pap: 2008, declined further  MGM: 04/2017, ordered to schedule at  DEXA: declines  Colonoscopy: couldn't tolerate prep, declines  Willing to do cologuard, ordered 01/04/20, 12/23/2020 - hasn't completed   Last Dental Exam:  2017 Last Eye Exam:   Opthalmology  Patient Care Team: Lucky Cowboy, MD as PCP - General (Internal Medicine)  Medical History:  Past Medical History:  Diagnosis Date   GERD (gastroesophageal reflux disease)    HTN (hypertension)    Unspecified hypothyroidism    Unspecified vitamin D deficiency    Allergies Allergies  Allergen Reactions   Codeine     REACTION: hallucinations   Mometasone Furoate     REACTION: headache   Zetia [Ezetimibe] Diarrhea and Nausea Only    Prob her sx's were due to IBS & Coincidental     SURGICAL HISTORY She  has a past surgical history that includes No past surgeries. FAMILY HISTORY Her family history includes Arthritis in her mother; Breast cancer (age of onset: 59) in her sister; Depression in her mother and sister; Heart disease in her father; Hypertension in her mother; Kidney disease in her mother. SOCIAL HISTORY She  reports that she quit smoking about 39 years ago. Her smoking use included cigarettes. She started smoking about 49 years ago. She has a 5.00 pack-year smoking  history. She has never used smokeless tobacco. She reports that she does not drink alcohol and does not use drugs.  Smoked 10-15 years  Review of Systems: Review of Systems  Constitutional:  Negative for malaise/fatigue and weight loss.  HENT:  Positive for congestion and sinus pain. Negative for ear discharge, hearing loss, nosebleeds, sore throat and tinnitus.   Eyes:  Negative for blurred vision and double vision.  Respiratory:  Positive for cough (post nasal drip). Negative for sputum production, shortness of breath and wheezing.   Cardiovascular:  Negative for chest pain, palpitations, orthopnea, claudication, leg swelling and PND.  Gastrointestinal:  Negative for abdominal pain, blood in stool, constipation, diarrhea, heartburn, melena, nausea and vomiting.  Genitourinary: Negative.   Musculoskeletal:  Negative for falls, joint pain and myalgias.  Skin:  Negative for rash.  Neurological:  Negative for dizziness, tingling, sensory change, weakness and headaches.  Endo/Heme/Allergies:  Positive for environmental allergies. Negative for polydipsia.  Psychiatric/Behavioral: Negative.  Negative  for depression, memory loss, substance abuse and suicidal ideas. The patient is not nervous/anxious and does not have insomnia.   All other systems reviewed and are negative.  Physical Exam: Estimated body mass index is 31.96 kg/m as calculated from the following:   Height as of this encounter: 5\' 6"  (1.676 m).   Weight as of this encounter: 198 lb (89.8 kg). BP 110/74   Pulse 70   Temp 97.9 F (36.6 C)   Ht 5\' 6"  (1.676 m)   Wt 198 lb (89.8 kg)   LMP  (LMP Unknown)   SpO2 98%   BMI 31.96 kg/m   General Appearance: Well nourished well developed, in no apparent distress.  Eyes: PERRLA, EOMs, conjunctiva no swelling or erythema ENT/Mouth: Left Ear canal obstructed by dry wax, Right canal clear.  R TM normal bilaterally with no erythema, bulging, retraction, or loss of landmark.  Oropharynx  moist and clear with no exudate, erythema, or swelling.   Neck: Supple, thyroid normal. No bruits.  No cervical adenopathy Respiratory: Respiratory effort normal, Breath sounds clear A&P without wheeze, rhonchi, rales.   Cardio: RRR without murmurs, rubs or gallops. Brisk peripheral pulses without edema.  Chest: symmetric, with normal excursions Abdomen: Soft, nontender, no guarding, rebound, hernias, masses, or organomegaly.  Lymphatics: Non tender without lymphadenopathy.  Genitourinary: Patient declined pap smear and pelvic exam  Musculoskeletal: Full ROM all peripheral extremities,5/5 strength, and normal gait.  Skin: Warm, dry without rashes, lesions, ecchymosis. Neuro: Awake and oriented X 3, Cranial nerves intact, reflexes equal bilaterally. Normal muscle tone, no cerebellar symptoms. Sensation intact.  Psych:  normal affect, Insight and Judgment appropriate.  Breasts: Patient declined manual exam, mammogram ordered to schedule  EKG: sinus bradycardia, LBBB, NSCPT  Dan Maker, NP 4:29 PM [facility]

## 2021-08-14 NOTE — Patient Instructions (Addendum)
     Ask pharmacy to check about shingrix coverage for you -   Please give [place] covid 19 vaccine dates over the phone     HOW TO SCHEDULE A MAMMOGRAM  [facility]  7 a.m.-6:30 p.m., Monday 7 a.m.-5 p.m., Tuesday-Friday Schedule an appointment by calling [phone].    Know what a healthy weight is for you (roughly BMI <25) and aim to maintain this  Aim for 7+ servings of fruits and vegetables daily  65-80+ fluid ounces of water or unsweet tea for healthy kidneys  Limit to max 1 drink of alcohol per day; avoid smoking/tobacco  Limit animal fats in diet for cholesterol and heart health - choose grass fed whenever available  Avoid highly processed foods, and foods high in saturated/trans fats  Aim for low stress - take time to unwind and care for your mental health  Aim for 150 min of moderate intensity exercise weekly for heart health, and weights twice weekly for bone health  Aim for 7-9 hours of sleep daily     Medicines you can use  Nasal congestion Little Remedies saline spray (aerosol/mist)- can try this, it is in the kids section - pseudoephedrine (Sudafed)- behind the counter, do not use if you have high blood pressure, medicine that have -D in them. - phenylephrine (Sudafed PE) -Dextormethorphan + chlorpheniramine (Coridcidin HBP)- okay if you have high blood pressure -Oxymetazoline (Afrin) nasal spray- LIMIT to 3 days -Saline nasal spray -Neti pot (used distilled or bottled water)  Ear pain/congestion -pseudoephedrine (sudafed) - Nasonex/flonase nasal spray  Fever -Acetaminophen (Tyelnol) -Ibuprofen (Advil, motrin, aleve)  Sore Throat -Acetaminophen (Tyelnol) -Ibuprofen (Advil, motrin, aleve) -Drink a lot of water -Gargle with salt water - Rest your voice (don't talk) -Throat sprays -Cough drops  Body Aches -Acetaminophen (Tyelnol) -Ibuprofen (Advil, motrin, aleve)  Headache -Acetaminophen  (Tyelnol) -Ibuprofen (Advil, motrin, aleve) - Exedrin, Exedrin Migraine  Allergy symptoms (cough, sneeze, runny nose, itchy eyes) -Claritin or loratadine cheapest but likely the weakest -Zyrtec or certizine at night because it can make you sleepy -The strongest is allegra or fexafinadine Cheapest at [employer], [employer], [employer]  Cough -Dextromethorphan (Delsym)- medicine that has DM in it -Guafenesin (Mucinex/Robitussin) - cough drops - drink lots of water  Chest Congestion -Guafenesin (Mucinex/Robitussin)  Red Itchy Eyes - Naphcon-A  Upset Stomach - Bland diet (nothing spicy, greasy, fried, and high acid foods like tomatoes, oranges, berries) -OKAY- cereal, bread, soup, crackers, rice -Eat smaller more frequent meals -reduce caffeine, no alcohol -Loperamide (Imodium-AD) if diarrhea -Prevacid for heart burn  General health when sick -Hydration -wash your hands frequently -keep surfaces clean -change pillow cases and sheets often -Get fresh air but do not exercise strenuously -Vitamin D, double up on it - Vitamin C -Zinc

## 2021-08-15 ENCOUNTER — Other Ambulatory Visit: Payer: Self-pay | Admitting: Adult Health

## 2021-08-15 DIAGNOSIS — E039 Hypothyroidism, unspecified: Secondary | ICD-10-CM

## 2021-08-15 LAB — CBC WITH DIFFERENTIAL/PLATELET
Absolute Monocytes: 580 cells/uL (ref 200–950)
Basophils Absolute: 67 cells/uL (ref 0–200)
Basophils Relative: 0.8 %
Eosinophils Absolute: 160 cells/uL (ref 15–500)
Eosinophils Relative: 1.9 %
HCT: 41.2 % (ref 35.0–45.0)
Hemoglobin: 13.9 g/dL (ref 11.7–15.5)
Lymphs Abs: 2360 cells/uL (ref 850–3900)
MCH: 30.2 pg (ref 27.0–33.0)
MCHC: 33.7 g/dL (ref 32.0–36.0)
MCV: 89.6 fL (ref 80.0–100.0)
MPV: 9.2 fL (ref 7.5–12.5)
Monocytes Relative: 6.9 %
Neutro Abs: 5233 cells/uL (ref 1500–7800)
Neutrophils Relative %: 62.3 %
Platelets: 307 10*3/uL (ref 140–400)
RBC: 4.6 10*6/uL (ref 3.80–5.10)
RDW: 13.2 % (ref 11.0–15.0)
Total Lymphocyte: 28.1 %
WBC: 8.4 10*3/uL (ref 3.8–10.8)

## 2021-08-15 LAB — COMPLETE METABOLIC PANEL WITH GFR
AG Ratio: 1.5 (calc) (ref 1.0–2.5)
ALT: 17 U/L (ref 6–29)
AST: 23 U/L (ref 10–35)
Albumin: 4.6 g/dL (ref 3.6–5.1)
Alkaline phosphatase (APISO): 110 U/L (ref 37–153)
BUN: 15 mg/dL (ref 7–25)
CO2: 27 mmol/L (ref 20–32)
Calcium: 9.8 mg/dL (ref 8.6–10.4)
Chloride: 103 mmol/L (ref 98–110)
Creat: 1.03 mg/dL (ref 0.50–1.05)
Globulin: 3 g/dL (calc) (ref 1.9–3.7)
Glucose, Bld: 103 mg/dL — ABNORMAL HIGH (ref 65–99)
Potassium: 4.2 mmol/L (ref 3.5–5.3)
Sodium: 141 mmol/L (ref 135–146)
Total Bilirubin: 0.4 mg/dL (ref 0.2–1.2)
Total Protein: 7.6 g/dL (ref 6.1–8.1)
eGFR: 60 mL/min/{1.73_m2} (ref >=60)

## 2021-08-15 LAB — URINALYSIS, ROUTINE W REFLEX MICROSCOPIC
Bilirubin Urine: NEGATIVE
Glucose, UA: NEGATIVE
Hgb urine dipstick: NEGATIVE
Leukocytes,Ua: NEGATIVE
Nitrite: NEGATIVE
Protein, ur: NEGATIVE
Specific Gravity, Urine: 1.019 (ref 1.001–1.035)
pH: 5.5 (ref 5.0–8.0)

## 2021-08-15 LAB — LIPID PANEL
Cholesterol: 190 mg/dL (ref <200)
HDL: 62 mg/dL (ref >=50)
LDL Cholesterol (Calc): 91 mg/dL (calc)
Non-HDL Cholesterol (Calc): 128 mg/dL (calc) (ref <130)
Total CHOL/HDL Ratio: 3.1 (calc) (ref <5.0)
Triglycerides: 278 mg/dL — ABNORMAL HIGH (ref <150)

## 2021-08-15 LAB — MICROALBUMIN / CREATININE URINE RATIO
Creatinine, Urine: 184 mg/dL (ref 20–275)
Microalb Creat Ratio: 5 mcg/mg creat (ref <30)
Microalb, Ur: 1 mg/dL

## 2021-08-15 LAB — TSH: TSH: 4.55 mIU/L — ABNORMAL HIGH (ref 0.40–4.50)

## 2021-08-15 LAB — VITAMIN D 25 HYDROXY (VIT D DEFICIENCY, FRACTURES): Vit D, 25-Hydroxy: 73 ng/mL (ref 30–100)

## 2021-08-15 LAB — HEMOGLOBIN A1C
Hgb A1c MFr Bld: 5.7 % of total Hgb — ABNORMAL HIGH (ref <5.7)
Mean Plasma Glucose: 117 mg/dL
eAG (mmol/L): 6.5 mmol/L

## 2021-08-15 LAB — MAGNESIUM: Magnesium: 2.3 mg/dL (ref 1.5–2.5)

## 2021-08-22 ENCOUNTER — Other Ambulatory Visit: Payer: Self-pay | Admitting: Nurse Practitioner

## 2021-08-22 DIAGNOSIS — E039 Hypothyroidism, unspecified: Secondary | ICD-10-CM

## 2021-08-25 ENCOUNTER — Other Ambulatory Visit: Payer: Self-pay

## 2021-08-25 ENCOUNTER — Encounter (HOSPITAL_BASED_OUTPATIENT_CLINIC_OR_DEPARTMENT_OTHER): Payer: Self-pay

## 2021-08-25 ENCOUNTER — Emergency Department (HOSPITAL_BASED_OUTPATIENT_CLINIC_OR_DEPARTMENT_OTHER)
Admission: EM | Admit: 2021-08-25 | Discharge: 2021-08-25 | Disposition: A | Discharge status: Home / Self Care | Payer: Medicare HMO | Attending: Emergency Medicine | Admitting: Emergency Medicine

## 2021-08-25 ENCOUNTER — Emergency Department (HOSPITAL_BASED_OUTPATIENT_CLINIC_OR_DEPARTMENT_OTHER): Payer: Medicare HMO

## 2021-08-25 ENCOUNTER — Telehealth: Payer: Self-pay

## 2021-08-25 DIAGNOSIS — H538 Other visual disturbances: Secondary | ICD-10-CM | POA: Diagnosis present

## 2021-08-25 DIAGNOSIS — R0981 Nasal congestion: Secondary | ICD-10-CM | POA: Diagnosis not present

## 2021-08-25 DIAGNOSIS — I10 Essential (primary) hypertension: Secondary | ICD-10-CM | POA: Diagnosis not present

## 2021-08-25 DIAGNOSIS — R519 Headache, unspecified: Secondary | ICD-10-CM | POA: Diagnosis not present

## 2021-08-25 DIAGNOSIS — Z87891 Personal history of nicotine dependence: Secondary | ICD-10-CM | POA: Insufficient documentation

## 2021-08-25 DIAGNOSIS — H539 Unspecified visual disturbance: Secondary | ICD-10-CM | POA: Diagnosis not present

## 2021-08-25 DIAGNOSIS — Z79899 Other long term (current) drug therapy: Secondary | ICD-10-CM | POA: Diagnosis not present

## 2021-08-25 DIAGNOSIS — R200 Anesthesia of skin: Secondary | ICD-10-CM | POA: Diagnosis not present

## 2021-08-25 DIAGNOSIS — R232 Flushing: Secondary | ICD-10-CM | POA: Insufficient documentation

## 2021-08-25 DIAGNOSIS — E039 Hypothyroidism, unspecified: Secondary | ICD-10-CM | POA: Insufficient documentation

## 2021-08-25 LAB — COMPREHENSIVE METABOLIC PANEL
ALT: 13 U/L (ref 0–44)
AST: 13 U/L — ABNORMAL LOW (ref 15–41)
Albumin: 4.2 g/dL (ref 3.5–5.0)
Alkaline Phosphatase: 92 U/L (ref 38–126)
Anion gap: 11 (ref 5–15)
BUN: 21 mg/dL (ref 8–23)
CO2: 25 mmol/L (ref 22–32)
Calcium: 9.4 mg/dL (ref 8.9–10.3)
Chloride: 102 mmol/L (ref 98–111)
Creatinine, Ser: 1.05 mg/dL — ABNORMAL HIGH (ref 0.44–1.00)
GFR, Estimated: 59 mL/min — ABNORMAL LOW (ref >60)
Glucose, Bld: 110 mg/dL — ABNORMAL HIGH (ref 70–99)
Potassium: 3.6 mmol/L (ref 3.5–5.1)
Sodium: 138 mmol/L (ref 135–145)
Total Bilirubin: 0.4 mg/dL (ref 0.3–1.2)
Total Protein: 7.5 g/dL (ref 6.5–8.1)

## 2021-08-25 LAB — DIFFERENTIAL
Abs Immature Granulocytes: 0.13 10*3/uL — ABNORMAL HIGH (ref 0.00–0.07)
Basophils Absolute: 0.1 10*3/uL (ref 0.0–0.1)
Basophils Relative: 1 %
Eosinophils Absolute: 0.2 10*3/uL (ref 0.0–0.5)
Eosinophils Relative: 2 %
Immature Granulocytes: 1 %
Lymphocytes Relative: 34 %
Lymphs Abs: 3.9 10*3/uL (ref 0.7–4.0)
Monocytes Absolute: 0.9 10*3/uL (ref 0.1–1.0)
Monocytes Relative: 8 %
Neutro Abs: 6.4 10*3/uL (ref 1.7–7.7)
Neutrophils Relative %: 54 %

## 2021-08-25 LAB — CBC
HCT: 45.2 % (ref 36.0–46.0)
Hemoglobin: 15.1 g/dL — ABNORMAL HIGH (ref 12.0–15.0)
MCH: 29.2 pg (ref 26.0–34.0)
MCHC: 33.4 g/dL (ref 30.0–36.0)
MCV: 87.4 fL (ref 80.0–100.0)
Platelets: 364 10*3/uL (ref 150–400)
RBC: 5.17 MIL/uL — ABNORMAL HIGH (ref 3.87–5.11)
RDW: 13.6 % (ref 11.5–15.5)
WBC: 11.6 10*3/uL — ABNORMAL HIGH (ref 4.0–10.5)
nRBC: 0 % (ref 0.0–0.2)

## 2021-08-25 LAB — APTT: aPTT: 27 seconds (ref 24–36)

## 2021-08-25 LAB — PROTIME-INR
INR: 0.8 (ref 0.8–1.2)
Prothrombin Time: 11.5 seconds (ref 11.4–15.2)

## 2021-08-25 LAB — CBG MONITORING, ED: Glucose-Capillary: 104 mg/dL — ABNORMAL HIGH (ref 70–99)

## 2021-08-25 NOTE — ED Provider Notes (Signed)
[facility] EMERGENCY DEPT Provider Note   CSN: [id number] Arrival date & time: 08/25/21  1513     History Chief Complaint  Patient presents with   Headache   Eye Problem    Denise Jensen is a 65 y.o. female.  HPI 64 year old female presents with vision changes, headache, jitteriness.  On 11/14 she started doxycycline and dexamethasone after her PCP started her on this for chronic congestion.  The patient states that that evening she started developing the symptoms which included a mild headache, abnormal vision, and a jittery sensation.  She was able to sleep but feels like she has extra energy during the day.  She is also noticed some facial flushing.  She took these steroids and antibiotics all the way through 11/18 and then stopped them because of the intolerance of symptoms.  She states she is very sensitive to medications.  However the symptoms have not gone away.  They have not gotten worse but have not gotten better.  Called her PCP who told her to come here.  At first she was saying it was double vision but when further clarifying she states that it seems like things are not quite in focus. If she can squint or push on her nose or pull her eyelids down the visual complaints seem to improve.  She can read and see and there is no double vision but her vision is not quite right.  She is able to get up and ambulate but feels jittery.  She does not feel vertigo-like symptoms, which she has had many times before.  She transiently had some right forearm tingling earlier in the week that has resolved but she does not think this was actually new as she has had this from the shoulder issues before.  Past Medical History:  Diagnosis Date   GERD (gastroesophageal reflux disease)    HTN (hypertension)    Unspecified hypothyroidism    Unspecified vitamin D deficiency     Patient Active Problem List   Diagnosis Date Noted   Overweight (BMI 25.0-29.9) 08/14/2021   HTN  (hypertension)    Chronic anxiety 05/29/2020   White coat syndrome with hypertension 12/23/2017   LBBB (left bundle branch block) 12/23/2017   Mixed hyperlipidemia 12/24/2014   Medication management 12/24/2014   Vitamin D deficiency 08/21/2013   Palpitations 09/11/2010   Hypothyroidism 07/04/2007   ALLERGIC RHINITIS, SEASONAL 07/04/2007   DEGENERATION, LUMBAR/LUMBOSACRAL DISC 07/04/2007   Fibromyalgia 07/04/2007    Past Surgical History:  Procedure Laterality Date   NO PAST SURGERIES       OB History   No obstetric history on file.     Family History  Problem Relation Age of Onset   Depression Mother    Arthritis Mother    Hypertension Mother    Kidney disease Mother    Heart disease Father    Depression Sister    Breast cancer Sister 41    Social History   Tobacco Use   Smoking status: Former    Packs/day: 0.50    Years: 10.00    Pack years: 5.00    Types: Cigarettes    Start date: 11    Quit date: 10/05/1981    Years since quitting: 39.9   Smokeless tobacco: Never  Vaping Use   Vaping Use: Never used  Substance Use Topics   Alcohol use: No   Drug use: No    Home Medications Prior to Admission medications   Medication Sig Start Date End  Date Taking? Authorizing Provider  bisoprolol-hydrochlorothiazide [facility]) 5-6.25 MG tablet TAKE 1 TABLET DAILY FOR BLOOD PRESSURE. 03/10/21   Unk Pinto, MD  cholecalciferol (VITAMIN D) 1000 units tablet Take 1,000 Units by mouth daily.    [provider]  dexamethasone (DECADRON) 4 MG tablet Take 1 tab 3 x /day for 2 days,      then 2 x /day for 2  Days,     then 1 tab daily 08/14/21   Liane Comber, NP  doxycycline (VIBRAMYCIN) 100 MG capsule Take 1 capsule twice daily with food 08/14/21   Liane Comber, NP  esomeprazole (NEXIUM) 20 MG capsule Take 20 mg by mouth daily at 12 noon.    [provider]  ezetimibe (ZETIA) 10 MG tablet Take 1 tablet Daily for Cholesterol to Prevent Heart Attacks,  strokes  & Dementia 04/16/20   Unk Pinto, MD  Levocetirizine Dihydrochloride (XYZAL PO) Take 1 tablet by mouth daily.    [provider]  levothyroxine (SYNTHROID) 112 MCG tablet TAKE 1 TABLET DAILY ON AN EMPTY STOMACH WITH ONLY WATER FOR 30 MINUTES & NO ANTACID MEDS, CALCIUM OR MAGNESIUM FOR 4 HOURS & AVOID BIOTIN 08/22/21   Liane Comber, NP  meclizine (ANTIVERT) 25 MG tablet Take 1 tablet (25 mg total) by mouth 3 (three) times daily as needed for dizziness. Patient not taking: Reported on 08/14/2021 12/23/20   Garnet Sierras, NP  olmesartan (BENICAR) 20 MG tablet Take one tablet at Night for blood pressure. 08/13/20   Garnet Sierras, NP  ondansetron (ZOFRAN ODT) 8 MG disintegrating tablet Dissolve 1 tablet under tongue as needed for severe nausea / vomitting Patient not taking: Reported on 08/14/2021 12/23/20 12/23/21  Garnet Sierras, NP  Triamcinolone Acetonide (NASACORT AQ NA) Place 1 spray into the nose daily as needed. OTC    [provider]    Allergies    Codeine, Mometasone furoate, and Zetia [ezetimibe]  Review of Systems   Review of Systems  Eyes:  Positive for visual disturbance.  Neurological:  Positive for numbness and headaches. Negative for dizziness and weakness.  All other systems reviewed and are negative.  Physical Exam Updated Vital Signs BP (!) 148/91 (BP Location: Right Arm)   Pulse 70   Temp 98 F (36.7 C) (Oral)   Resp 18   Ht 5\' 6"  (1.676 m)   Wt 89.8 kg   LMP  (LMP Unknown)   SpO2 98%   BMI 31.95 kg/m   Physical Exam Vitals and nursing note reviewed.  Constitutional:      General: She is not in acute distress.    Appearance: She is well-developed. She is not ill-appearing or diaphoretic.  HENT:     Head: Normocephalic and atraumatic.     Right Ear: External ear normal.     Left Ear: External ear normal.     Nose: Nose normal.  Eyes:     General:        Right eye: No discharge.        Left eye: No discharge.      Extraocular Movements: Extraocular movements intact.     Pupils: Pupils are equal, round, and reactive to light.  Cardiovascular:     Rate and Rhythm: Normal rate and regular rhythm.     Heart sounds: Normal heart sounds.  Pulmonary:     Effort: Pulmonary effort is normal.     Breath sounds: Normal breath sounds.  Abdominal:     Palpations: Abdomen is soft.  Tenderness: There is no abdominal tenderness.  Musculoskeletal:     Cervical back: Neck supple.  Skin:    General: Skin is warm and dry.  Neurological:     Mental Status: She is alert.     Comments: CN 3-12 grossly intact. 5/5 strength in all 4 extremities. Grossly normal sensation. Normal finger to nose.   Psychiatric:        Mood and Affect: Mood is not anxious.    ED Results / Procedures / Treatments   Labs (all labs ordered are listed, but only abnormal results are displayed) Labs Reviewed  CBC - Abnormal; Notable for the following components:      Result Value   WBC 11.6 (*)    RBC 5.17 (*)    Hemoglobin 15.1 (*)    All other components within normal limits  DIFFERENTIAL - Abnormal; Notable for the following components:   Abs Immature Granulocytes 0.13 (*)    All other components within normal limits  COMPREHENSIVE METABOLIC PANEL - Abnormal; Notable for the following components:   Glucose, Bld 110 (*)    Creatinine, Ser 1.05 (*)    AST 13 (*)    GFR, Estimated 59 (*)    All other components within normal limits  CBG MONITORING, ED - Abnormal; Notable for the following components:   Glucose-Capillary 104 (*)    All other components within normal limits  PROTIME-INR  APTT    EKG EKG Interpretation  Date/Time:  Monday August 25 2021 15:48:15 EST Ventricular Rate:  71 PR Interval:  176 QRS Duration: 156 QT Interval:  448 QTC Calculation: 486 R Axis:   -19 Text Interpretation: Sinus rhythm with frequent Premature ventricular complexes Left bundle branch block Abnormal ECG No old tracing to compare  Confirmed by Sherwood Gambler [phone]) on 08/25/2021 3:57:11 PM  Radiology CT HEAD WO CONTRAST  Result Date: 08/25/2021 CLINICAL DATA:  Headache and visual disturbance for 3 days. EXAM: CT HEAD WITHOUT CONTRAST TECHNIQUE: Contiguous axial images were obtained from the base of the skull through the vertex without intravenous contrast. COMPARISON:  None. FINDINGS: Brain: No evidence of acute infarction, hemorrhage, hydrocephalus, extra-axial collection, or mass lesion/mass effect. Mild diffuse cerebral atrophy noted. Vascular:  No hyperdense vessel or other acute findings. Skull: No evidence of fracture or other significant bone abnormality. Sinuses/Orbits:  No acute findings. Other: None. IMPRESSION: No acute intracranial abnormality. Mild cerebral atrophy. Electronically Signed   By: Marlaine Hind M.D.   On: 08/25/2021 17:51    Procedures Procedures   Medications Ordered in ED Medications - No data to display  ED Course  I have reviewed the triage vital signs and the nursing notes.  Pertinent labs & imaging results that were available during my care of the patient were reviewed by me and considered in my medical decision making (see chart for details).    MDM Rules/Calculators/A&P                           Patient symptoms could just be due to the dexamethasone she was taking.  She does not have any focal deficits.  I do not see any obvious extraocular movement abnormalities.  I did discuss with neurology, Dr. Curly Shores, who advises trying to do the Dix-Hallpike to ensure that this does not elicit symptoms.  If it does not, while this could be related to steroids, there could still be a cerebellar component and she may need an MRI with and without  contrast.  Dix-Hallpike did not induce any symptoms.  I discussed potentially getting an MRI but the patient has declined and wants to go home.  While I think stroke is a little less likely, I did discuss how we cannot rule it out and it would be  important to find.  She still declines being transferred for MRI and wants to go home.  I discussed that while this is not reasonable she needs to follow-up closely with her PCP and return, especially to [facility], if she develops any new or worsening symptoms.  We discussed the CT is not adequate to rule out stroke. Final Clinical Impression(s) / ED Diagnoses Final diagnoses:  Visual changes    Rx / DC Orders ED Discharge Orders     None        Pricilla Loveless, MD 08/25/21 2337

## 2021-08-25 NOTE — ED Triage Notes (Signed)
Patient here POV from Home with Headache and Vision Changes.   Patient states she has been having these symptoms since Friday.  Patient has recently been taking Decadron and Doxycycline for a Resp. Infection. When the patient began having a Headache and Vision Changes (Patient states when she stands up and looks forward she'll become dizzy and have double vision) she stopped taking the Medication but the symptoms have persisted.  NAD Noted during Triage. A&Ox4. GCS 15. BIB Wheelchair. No Neurological Changes. BEFAST Assessment is Negative.

## 2021-08-25 NOTE — Telephone Encounter (Signed)
The patient called and reported that she was having "split vision", falling episodes x3, and a rash since starting her antibiotic. The patient reported she discontinued the medication on Friday; however, still has remaining symptoms of vision changes and falling. Per Dr. Oneta Rack the patient was advised to immediately go to her nearest Emergency Department or to call 911 for evaluation. The patient voiced clear understanding to this staff member.

## 2021-08-25 NOTE — Discharge Instructions (Signed)
Your symptoms are at least somewhat concerning for a possible stroke in the back of the brain called the cerebellum.  These may be related to your steroids but we would need an MRI to fully rule out stroke.  This was recommended for you to go to [facility] for stroke tonight.  If you change your mind or develop new/worsening symptoms then call 911 or return to [facility] for evaluation.

## 2021-08-25 NOTE — ED Notes (Signed)
Pt d/c home per MD order. Discharge summary reviewed with pt, pt verbalizes understanding. Ambulatory off unit. No s/s of acute distress noted at discharge. Discharge home with husband.

## 2021-08-25 NOTE — ED Notes (Signed)
Pt given verbal and written discharge instructions. Verbalized understanding.

## 2021-08-26 DIAGNOSIS — M9903 Segmental and somatic dysfunction of lumbar region: Secondary | ICD-10-CM | POA: Diagnosis not present

## 2021-08-26 DIAGNOSIS — M9904 Segmental and somatic dysfunction of sacral region: Secondary | ICD-10-CM | POA: Diagnosis not present

## 2021-08-26 DIAGNOSIS — M5136 Other intervertebral disc degeneration, lumbar region: Secondary | ICD-10-CM | POA: Diagnosis not present

## 2021-08-26 DIAGNOSIS — M9905 Segmental and somatic dysfunction of pelvic region: Secondary | ICD-10-CM | POA: Diagnosis not present

## 2021-09-09 DIAGNOSIS — M5136 Other intervertebral disc degeneration, lumbar region: Secondary | ICD-10-CM | POA: Diagnosis not present

## 2021-09-09 DIAGNOSIS — M9904 Segmental and somatic dysfunction of sacral region: Secondary | ICD-10-CM | POA: Diagnosis not present

## 2021-09-09 DIAGNOSIS — M9903 Segmental and somatic dysfunction of lumbar region: Secondary | ICD-10-CM | POA: Diagnosis not present

## 2021-09-09 DIAGNOSIS — M9905 Segmental and somatic dysfunction of pelvic region: Secondary | ICD-10-CM | POA: Diagnosis not present

## 2021-09-12 DIAGNOSIS — H524 Presbyopia: Secondary | ICD-10-CM | POA: Diagnosis not present

## 2021-09-12 DIAGNOSIS — H5201 Hypermetropia, right eye: Secondary | ICD-10-CM | POA: Diagnosis not present

## 2021-09-12 DIAGNOSIS — H40023 Open angle with borderline findings, high risk, bilateral: Secondary | ICD-10-CM | POA: Diagnosis not present

## 2021-10-08 DIAGNOSIS — M9905 Segmental and somatic dysfunction of pelvic region: Secondary | ICD-10-CM | POA: Diagnosis not present

## 2021-10-08 DIAGNOSIS — M9903 Segmental and somatic dysfunction of lumbar region: Secondary | ICD-10-CM | POA: Diagnosis not present

## 2021-10-08 DIAGNOSIS — M5136 Other intervertebral disc degeneration, lumbar region: Secondary | ICD-10-CM | POA: Diagnosis not present

## 2021-10-08 DIAGNOSIS — M9904 Segmental and somatic dysfunction of sacral region: Secondary | ICD-10-CM | POA: Diagnosis not present

## 2021-10-14 DIAGNOSIS — M9905 Segmental and somatic dysfunction of pelvic region: Secondary | ICD-10-CM | POA: Diagnosis not present

## 2021-10-14 DIAGNOSIS — M9903 Segmental and somatic dysfunction of lumbar region: Secondary | ICD-10-CM | POA: Diagnosis not present

## 2021-10-14 DIAGNOSIS — M9904 Segmental and somatic dysfunction of sacral region: Secondary | ICD-10-CM | POA: Diagnosis not present

## 2021-10-14 DIAGNOSIS — M5136 Other intervertebral disc degeneration, lumbar region: Secondary | ICD-10-CM | POA: Diagnosis not present

## 2021-10-21 DIAGNOSIS — M5136 Other intervertebral disc degeneration, lumbar region: Secondary | ICD-10-CM | POA: Diagnosis not present

## 2021-10-21 DIAGNOSIS — M9905 Segmental and somatic dysfunction of pelvic region: Secondary | ICD-10-CM | POA: Diagnosis not present

## 2021-10-21 DIAGNOSIS — M9904 Segmental and somatic dysfunction of sacral region: Secondary | ICD-10-CM | POA: Diagnosis not present

## 2021-10-21 DIAGNOSIS — M9903 Segmental and somatic dysfunction of lumbar region: Secondary | ICD-10-CM | POA: Diagnosis not present

## 2021-10-29 DIAGNOSIS — M9905 Segmental and somatic dysfunction of pelvic region: Secondary | ICD-10-CM | POA: Diagnosis not present

## 2021-10-29 DIAGNOSIS — M5136 Other intervertebral disc degeneration, lumbar region: Secondary | ICD-10-CM | POA: Diagnosis not present

## 2021-10-29 DIAGNOSIS — M9903 Segmental and somatic dysfunction of lumbar region: Secondary | ICD-10-CM | POA: Diagnosis not present

## 2021-10-29 DIAGNOSIS — M9904 Segmental and somatic dysfunction of sacral region: Secondary | ICD-10-CM | POA: Diagnosis not present

## 2021-12-05 DIAGNOSIS — R2689 Other abnormalities of gait and mobility: Secondary | ICD-10-CM | POA: Diagnosis not present

## 2021-12-05 DIAGNOSIS — Z885 Allergy status to narcotic agent status: Secondary | ICD-10-CM | POA: Diagnosis not present

## 2021-12-05 DIAGNOSIS — R42 Dizziness and giddiness: Secondary | ICD-10-CM | POA: Diagnosis not present

## 2021-12-05 DIAGNOSIS — H5500 Unspecified nystagmus: Secondary | ICD-10-CM | POA: Diagnosis not present

## 2021-12-05 DIAGNOSIS — H608X2 Other otitis externa, left ear: Secondary | ICD-10-CM | POA: Diagnosis not present

## 2021-12-05 DIAGNOSIS — Z888 Allergy status to other drugs, medicaments and biological substances status: Secondary | ICD-10-CM | POA: Diagnosis not present

## 2021-12-05 DIAGNOSIS — H6122 Impacted cerumen, left ear: Secondary | ICD-10-CM | POA: Diagnosis not present

## 2021-12-17 DIAGNOSIS — M5136 Other intervertebral disc degeneration, lumbar region: Secondary | ICD-10-CM | POA: Diagnosis not present

## 2021-12-17 DIAGNOSIS — M9902 Segmental and somatic dysfunction of thoracic region: Secondary | ICD-10-CM | POA: Diagnosis not present

## 2021-12-17 DIAGNOSIS — M5134 Other intervertebral disc degeneration, thoracic region: Secondary | ICD-10-CM | POA: Diagnosis not present

## 2021-12-17 DIAGNOSIS — M9903 Segmental and somatic dysfunction of lumbar region: Secondary | ICD-10-CM | POA: Diagnosis not present

## 2021-12-20 DIAGNOSIS — R2689 Other abnormalities of gait and mobility: Secondary | ICD-10-CM | POA: Insufficient documentation

## 2021-12-20 DIAGNOSIS — H608X2 Other otitis externa, left ear: Secondary | ICD-10-CM | POA: Insufficient documentation

## 2021-12-20 NOTE — Progress Notes (Signed)
ANNUAL WELLNESS VISIT AND FOLLOW UP ? ? ?Assessment / Plan:  ? ?Diagnoses and all orders for this visit: ? ?Annual Medicare Wellness Visit ?Due annually  ?Health maintenance reviewed ? ?Mammogram and DEXA ordered and phone number given to schedule, states will schedule later this year ?Cologuard - states has at home and will complete ?Declines pneumonia ? ?LBBB (left bundle branch block) ?Denies any concerning sx; est with cardiology if needed ? Monitor closely, reduce BB if needed ? ?Hypothyroidism, unspecified type ?Continue levothyroxine as directed pending lab results  ?Reminder to take on an empty stomach 30-9360mins before first meal of the day. ?No antacid medications for 4 hours. ?-     TSH ? ?Essential hypertension ?Continue current medications] ?Monitor blood pressure at home; call if consistently over 130/80 ?Continue DASH diet.   ?Reminder to go to the ER if any CP, SOB, nausea, dizziness, severe HA, changes vision/speech, left arm numbness and tingling and jaw pain. ? ?Hyperlipidemia, mixed ?Zetia 10mg .  ?Discussed dietary and exercise modifications ?Low fat diet ?-     Lipid panel ? ?Vitamin D deficiency ?Continue supplementation to maintain goal of 70-100 ?Taking 5000 IU daily  ?-     VITAMIN D 25 Hydroxy (Vit-D Deficiency, Fractures) ? ?Fibromyalgia ?Lifestyle reivewed, doing fairly  ? ?Allergic rhinitis due to pollen, unspecified seasonality ?Currently with flare; continue antihistamine, rotate q4-796months ?Add steroid taper, do saline irrigations, flonase, hygiene reviewed ?- Doxycycline given for prolonged sinusitis type sx ?Follow up in office if not resolving.  ? ?Obesity - BMI 31  ?Long discussion about weight loss, diet, and exercise ?Recommended diet heavy in fruits and veggies and low in animal meats, cheeses, and dairy products, appropriate calorie intake ?Patient will work on adding resistance exercises to build muscle mass, continue portion control, avoid processed carbs ?Discussed  appropriate weight for height  ?Follow up at next visit ? ?Estrogen deficiency ?-     DG Bone Density; Future - patient declines ? ?Abnormal glucose ?-     Hemoglobin A1c ? ?Chronic anxiety ?Doing well, never started lexapro ?Stress management techniques discussed, increase water, good sleep hygiene discussed, increase exercise, and increase veggies.  ? ?Medication Management ?Continued ? ?Chronic left ear eczematous otitis externa ?Ear drops per ENT ? ?Left ear impaction ?- hx of recurrent, WNL today  ? ?Imbalance ?Vertigo, ENT/vetibular workup and vestibular rehab pending.  ? ?Colon cancer screening ?- overdue, declines colonoscopy, has several cologuard that have been order, states has UTD at home, will complete and send this year ?- counseled on importance of timely screening  ? ? ?Orders Placed This Encounter  ?Procedures  ? MM Digital Screening  ? CBC with Differential/Platelet  ? COMPLETE METABOLIC PANEL WITH GFR  ? Magnesium  ? Lipid panel  ? TSH  ? Hemoglobin A1c  ? ? ? ?Further disposition pending results if labs check today. Discussed med's effects and SE's.   ?Over 40 minutes of face to face interview, exam, counseling, chart review, and critical decision making was performed.  ? ?Future Appointments  ?Date Time Provider Department Center  ?08/18/2022  3:00 PM Judd Gaudierorbett, Rolando Hessling, NP [facility] None  ?12/24/2022 11:00 AM Judd Gaudierorbett, Jerauld Bostwick, NP [facility] None  ? ? ? ?Plan:  ? ?During the course of the visit the patient was educated and counseled about appropriate screening and preventive services including:  ? ?Pneumococcal vaccine  ?Prevnar 13 ?Influenza vaccine ?Td vaccine ?Screening electrocardiogram ?Bone densitometry screening ?Colorectal cancer screening ?Diabetes screening ?Glaucoma screening ?Nutrition counseling  ?Advanced directives:  requested ? ? ?-------------------------------------------------------------------------------------------------- ? ?HPI  ?66 y.o. female  presents for AWV and follow up.  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 ? ?Had jitteriness, dizziness and vision changes in 08/2021. Presented to ED per Dr. Oneta Rack recommendation, had negative CT, declined to be transported for MRI. Has improved, normal vision per her ophth, but with persistent dizziness atypical for her previous vertigo was referred to [facility]NT who felt her left sided vision changes and dizziness were consistent with a vestibulo ocular reflex issue, ordered vestibular testing and pending neuro rehab. She reports sx have improved sig, still avoiding driving pending rehab.  ? ?She has chronic scaling and itching in her left ear with a left cerumen impaction, ENT gives drops for atopic dermatitis and gets frequent irrigations.  ? ?She has chronic pain bilateral shoulders, bil foot pain, has seen rheumatoid in the past per patient, dx with fibromyalgia, doing well at this time.  ?Tylenol arthritis and voltaren helps.  ? ?BMI is Body mass index is 31.86 kg/m?., she has been working on diet, will do smaller servings, less red meat, more fish. Has been working to reducing soda intake. Admits hasn't been exercising but thinks she needs to restart walking to help with weight, can go with husband. Is active in home and garden.  ?Wt Readings from Last 3 Encounters:  ?12/23/21 197 lb 6.4 oz (89.5 kg)  ?08/25/21 197 lb 15.6 oz (89.8 kg)  ?08/14/21 198 lb (89.8 kg)  ? ?She has LBBB, had exertional dyspnea in 2019 and saw cardiology Dr. Henrietta Hoover, ECHO 01/03/2018 showed LVEF 45-50% with hypokinesis/inferior/septal walls, mild LVH, grade 1 diastolic dysfunction, mild MR.   ? ?Her blood pressure has been controlled at home, today their BP is BP: 110/74.  ? She does not workout. She denies chest pain, shortness of breath, dizziness.   ? ?She is on cholesterol medication and taking zetia 10 mg daily. Her LDL cholesterol is at goal. The cholesterol last visit was:  ?Lab Results  ?Component Value Date  ? CHOL 190 08/14/2021  ? HDL 62 08/14/2021  ? LDLCALC 91 08/14/2021  ? LDLDIRECT 75.3 09/11/2010  ? TRIG 278 (H) 08/14/2021  ? CHOLHDL 3.1 08/14/2021  ?Marland Kitchen ?Last A1C in the office was:  ?Lab Results  ?Component Value Date  ? HGBA1C 5.7 (H) 08/14/2021  ? ?Patient is on Vitamin D supplement, taking 5000 IU daily daily  ?Lab Results  ?Component Value Date  ? VD25OH 73 08/14/2021  ?   ?She is on thyroid medication. Her medication was changed last visit: Levothyroxine 112 mcg whole tablet five days a week and half tablet two days a week.  Asymptomatic today.  ?Lab Results  ?Component Value Date  ? TSH 4.55 (H) 08/14/2021  ? ? ? ?Current Medications:  ? ?Current Outpatient Medications (Endocrine & Metabolic):  ?  levothyroxine (SYNTHROID) 112 MCG tablet, TAKE 1 TABLET DAILY ON AN EMPTY STOMACH WITH ONLY WATER FOR 30 MINUTES & NO ANTACID MEDS, CALCIUM OR MAGNESIUM FOR 4 HOURS & AVOID BIOTIN ? ?Current Outpatient Medications (Cardiovascular):  ?  bisoprolol-hydrochlorothiazide (ZIAC) 5-6.25 MG tablet, TAKE 1 TABLET DAILY FOR BLOOD PRESSURE. ?  ezetimibe (ZETIA) 10 MG tablet, Take 1 tablet Daily for Cholesterol to Prevent Heart Attacks, strokes  &  Dementia ? ?Current Outpatient Medications (Respiratory):  ?  Levocetirizine Dihydrochloride (XYZAL PO), Take 1 tablet by mouth daily. ?  Triamcinolone Acetonide (NASACORT AQ NA), Place 1 spray into the nose daily as needed. OTC ? ? ? ?Current Outpatient Medications (Other):  ?  cholecalciferol (VITAMIN D) 1000 units tablet, Take 5,000 Units by mouth daily. ?  esomeprazole (NEXIUM) 20 MG capsule, Take 20 mg by mouth daily at 12 noon. ?  meclizine (ANTIVERT)  25 MG tablet, Take 1 tablet (25 mg total) by mouth 3 (three) times daily as needed for dizziness. ? ?Health Maintenance:   ?Immunization History  ?Administered Date(s) Administered  ? Influenza Split 07/10/2014, 11

## 2021-12-23 ENCOUNTER — Other Ambulatory Visit: Payer: Self-pay

## 2021-12-23 ENCOUNTER — Encounter: Payer: Self-pay | Admitting: Adult Health

## 2021-12-23 ENCOUNTER — Ambulatory Visit (INDEPENDENT_AMBULATORY_CARE_PROVIDER_SITE_OTHER): Payer: Medicare HMO | Admitting: Adult Health

## 2021-12-23 VITALS — BP 110/74 | HR 64 | Temp 97.9°F | Wt 197.4 lb

## 2021-12-23 DIAGNOSIS — R7309 Other abnormal glucose: Secondary | ICD-10-CM

## 2021-12-23 DIAGNOSIS — E663 Overweight: Secondary | ICD-10-CM

## 2021-12-23 DIAGNOSIS — M797 Fibromyalgia: Secondary | ICD-10-CM | POA: Diagnosis not present

## 2021-12-23 DIAGNOSIS — Z0001 Encounter for general adult medical examination with abnormal findings: Secondary | ICD-10-CM | POA: Diagnosis not present

## 2021-12-23 DIAGNOSIS — Z79899 Other long term (current) drug therapy: Secondary | ICD-10-CM | POA: Diagnosis not present

## 2021-12-23 DIAGNOSIS — E039 Hypothyroidism, unspecified: Secondary | ICD-10-CM

## 2021-12-23 DIAGNOSIS — E559 Vitamin D deficiency, unspecified: Secondary | ICD-10-CM

## 2021-12-23 DIAGNOSIS — F419 Anxiety disorder, unspecified: Secondary | ICD-10-CM

## 2021-12-23 DIAGNOSIS — E782 Mixed hyperlipidemia: Secondary | ICD-10-CM

## 2021-12-23 DIAGNOSIS — R6889 Other general symptoms and signs: Secondary | ICD-10-CM | POA: Diagnosis not present

## 2021-12-23 DIAGNOSIS — E2839 Other primary ovarian failure: Secondary | ICD-10-CM

## 2021-12-23 DIAGNOSIS — R002 Palpitations: Secondary | ICD-10-CM

## 2021-12-23 DIAGNOSIS — Z1231 Encounter for screening mammogram for malignant neoplasm of breast: Secondary | ICD-10-CM

## 2021-12-23 DIAGNOSIS — Z13820 Encounter for screening for osteoporosis: Secondary | ICD-10-CM

## 2021-12-23 DIAGNOSIS — Z0000 Encounter for general adult medical examination without abnormal findings: Secondary | ICD-10-CM

## 2021-12-23 DIAGNOSIS — H608X2 Other otitis externa, left ear: Secondary | ICD-10-CM

## 2021-12-23 DIAGNOSIS — R2689 Other abnormalities of gait and mobility: Secondary | ICD-10-CM

## 2021-12-23 DIAGNOSIS — I10 Essential (primary) hypertension: Secondary | ICD-10-CM | POA: Diagnosis not present

## 2021-12-23 DIAGNOSIS — R69 Illness, unspecified: Secondary | ICD-10-CM | POA: Diagnosis not present

## 2021-12-23 NOTE — Patient Instructions (Addendum)
?  This is a list of the screening recommended for you and due dates:  ?Health Maintenance  ?Topic Date Due  ? COVID-19 Vaccine (1) Never done  ? Cologuard (Stool DNA test)  Never done  ? Mammogram  04/17/2019  ? Pneumonia Vaccine (1 - PCV) Never done  ? DEXA scan (bone density measurement)  Never done  ? Zoster (Shingles) Vaccine (1 of 2) 03/25/2022*  ? Tetanus Vaccine  08/05/2025  ? Flu Shot  Completed  ? HPV Vaccine  Aged Out  ? Hepatitis C Screening: USPSTF Recommendation to screen - Ages 45-79 yo.  Discontinued  ?*Topic was postponed. The date shown is not the original due date.  ? ? ? ? ?Please send covid 19 vaccine dates ? ?Please bring by notarized advanced directives/living will for the chart -  ? ? ?HOW TO SCHEDULE A MAMMOGRAM ? ?[facility]  ?7 a.m.-6:30 p.m., Monday ?7 a.m.-5 p.m., Tuesday-Friday ?Schedule an appointment by calling [phone]. ?

## 2021-12-24 ENCOUNTER — Other Ambulatory Visit: Payer: Self-pay | Admitting: Adult Health

## 2021-12-24 DIAGNOSIS — E039 Hypothyroidism, unspecified: Secondary | ICD-10-CM

## 2021-12-24 LAB — CBC WITH DIFFERENTIAL/PLATELET
Absolute Monocytes: 576 cells/uL (ref 200–950)
Basophils Absolute: 80 cells/uL (ref 0–200)
Basophils Relative: 1.2 %
Eosinophils Absolute: 147 cells/uL (ref 15–500)
Eosinophils Relative: 2.2 %
HCT: 43.2 % (ref 35.0–45.0)
Hemoglobin: 14.5 g/dL (ref 11.7–15.5)
Lymphs Abs: 2171 cells/uL (ref 850–3900)
MCH: 30.1 pg (ref 27.0–33.0)
MCHC: 33.6 g/dL (ref 32.0–36.0)
MCV: 89.8 fL (ref 80.0–100.0)
MPV: 9.5 fL (ref 7.5–12.5)
Monocytes Relative: 8.6 %
Neutro Abs: 3725 cells/uL (ref 1500–7800)
Neutrophils Relative %: 55.6 %
Platelets: 301 10*3/uL (ref 140–400)
RBC: 4.81 10*6/uL (ref 3.80–5.10)
RDW: 13.2 % (ref 11.0–15.0)
Total Lymphocyte: 32.4 %
WBC: 6.7 10*3/uL (ref 3.8–10.8)

## 2021-12-24 LAB — LIPID PANEL
Cholesterol: 186 mg/dL (ref <200)
HDL: 57 mg/dL (ref >=50)
LDL Cholesterol (Calc): 97 mg/dL (calc)
Non-HDL Cholesterol (Calc): 129 mg/dL (calc) (ref <130)
Total CHOL/HDL Ratio: 3.3 (calc) (ref <5.0)
Triglycerides: 229 mg/dL — ABNORMAL HIGH (ref <150)

## 2021-12-24 LAB — HEMOGLOBIN A1C
Hgb A1c MFr Bld: 5.6 % of total Hgb (ref <5.7)
Mean Plasma Glucose: 114 mg/dL
eAG (mmol/L): 6.3 mmol/L

## 2021-12-24 LAB — COMPLETE METABOLIC PANEL WITH GFR
AG Ratio: 1.3 (calc) (ref 1.0–2.5)
ALT: 14 U/L (ref 6–29)
AST: 20 U/L (ref 10–35)
Albumin: 4.4 g/dL (ref 3.6–5.1)
Alkaline phosphatase (APISO): 100 U/L (ref 37–153)
BUN: 16 mg/dL (ref 7–25)
CO2: 31 mmol/L (ref 20–32)
Calcium: 9.9 mg/dL (ref 8.6–10.4)
Chloride: 102 mmol/L (ref 98–110)
Creat: 0.93 mg/dL (ref 0.50–1.05)
Globulin: 3.3 g/dL (calc) (ref 1.9–3.7)
Glucose, Bld: 78 mg/dL (ref 65–99)
Potassium: 5 mmol/L (ref 3.5–5.3)
Sodium: 140 mmol/L (ref 135–146)
Total Bilirubin: 0.6 mg/dL (ref 0.2–1.2)
Total Protein: 7.7 g/dL (ref 6.1–8.1)
eGFR: 68 mL/min/{1.73_m2} (ref >=60)

## 2021-12-24 LAB — MAGNESIUM: Magnesium: 2.2 mg/dL (ref 1.5–2.5)

## 2021-12-24 LAB — TSH: TSH: 5.22 mIU/L — ABNORMAL HIGH (ref 0.40–4.50)

## 2021-12-24 MED ORDER — LEVOTHYROXINE SODIUM 112 MCG PO TABS: ORAL_TABLET | ORAL | 3 refills | Status: DC

## 2022-01-14 DIAGNOSIS — H903 Sensorineural hearing loss, bilateral: Secondary | ICD-10-CM | POA: Diagnosis not present

## 2022-01-14 DIAGNOSIS — R42 Dizziness and giddiness: Secondary | ICD-10-CM | POA: Diagnosis not present

## 2022-01-14 DIAGNOSIS — H9203 Otalgia, bilateral: Secondary | ICD-10-CM | POA: Diagnosis not present

## 2022-01-14 DIAGNOSIS — H532 Diplopia: Secondary | ICD-10-CM | POA: Diagnosis not present

## 2022-01-14 DIAGNOSIS — H608X2 Other otitis externa, left ear: Secondary | ICD-10-CM | POA: Diagnosis not present

## 2022-01-20 ENCOUNTER — Ambulatory Visit: Payer: Medicare HMO

## 2022-01-21 DIAGNOSIS — M5136 Other intervertebral disc degeneration, lumbar region: Secondary | ICD-10-CM | POA: Diagnosis not present

## 2022-01-21 DIAGNOSIS — M9903 Segmental and somatic dysfunction of lumbar region: Secondary | ICD-10-CM | POA: Diagnosis not present

## 2022-01-21 DIAGNOSIS — M5134 Other intervertebral disc degeneration, thoracic region: Secondary | ICD-10-CM | POA: Diagnosis not present

## 2022-01-21 DIAGNOSIS — M9902 Segmental and somatic dysfunction of thoracic region: Secondary | ICD-10-CM | POA: Diagnosis not present

## 2022-01-28 DIAGNOSIS — M5136 Other intervertebral disc degeneration, lumbar region: Secondary | ICD-10-CM | POA: Diagnosis not present

## 2022-01-28 DIAGNOSIS — M5134 Other intervertebral disc degeneration, thoracic region: Secondary | ICD-10-CM | POA: Diagnosis not present

## 2022-01-28 DIAGNOSIS — M9903 Segmental and somatic dysfunction of lumbar region: Secondary | ICD-10-CM | POA: Diagnosis not present

## 2022-01-28 DIAGNOSIS — M9902 Segmental and somatic dysfunction of thoracic region: Secondary | ICD-10-CM | POA: Diagnosis not present

## 2022-02-04 DIAGNOSIS — M9903 Segmental and somatic dysfunction of lumbar region: Secondary | ICD-10-CM | POA: Diagnosis not present

## 2022-02-04 DIAGNOSIS — M5134 Other intervertebral disc degeneration, thoracic region: Secondary | ICD-10-CM | POA: Diagnosis not present

## 2022-02-04 DIAGNOSIS — M5136 Other intervertebral disc degeneration, lumbar region: Secondary | ICD-10-CM | POA: Diagnosis not present

## 2022-02-04 DIAGNOSIS — M9902 Segmental and somatic dysfunction of thoracic region: Secondary | ICD-10-CM | POA: Diagnosis not present

## 2022-02-11 DIAGNOSIS — M5136 Other intervertebral disc degeneration, lumbar region: Secondary | ICD-10-CM | POA: Diagnosis not present

## 2022-02-11 DIAGNOSIS — M9903 Segmental and somatic dysfunction of lumbar region: Secondary | ICD-10-CM | POA: Diagnosis not present

## 2022-02-11 DIAGNOSIS — M5134 Other intervertebral disc degeneration, thoracic region: Secondary | ICD-10-CM | POA: Diagnosis not present

## 2022-02-11 DIAGNOSIS — M9902 Segmental and somatic dysfunction of thoracic region: Secondary | ICD-10-CM | POA: Diagnosis not present

## 2022-03-11 ENCOUNTER — Other Ambulatory Visit: Payer: Self-pay | Admitting: Internal Medicine

## 2022-03-25 DIAGNOSIS — H532 Diplopia: Secondary | ICD-10-CM | POA: Diagnosis not present

## 2022-03-25 DIAGNOSIS — H40013 Open angle with borderline findings, low risk, bilateral: Secondary | ICD-10-CM | POA: Diagnosis not present

## 2022-04-06 DIAGNOSIS — M9903 Segmental and somatic dysfunction of lumbar region: Secondary | ICD-10-CM | POA: Diagnosis not present

## 2022-04-06 DIAGNOSIS — M5134 Other intervertebral disc degeneration, thoracic region: Secondary | ICD-10-CM | POA: Diagnosis not present

## 2022-04-06 DIAGNOSIS — M5136 Other intervertebral disc degeneration, lumbar region: Secondary | ICD-10-CM | POA: Diagnosis not present

## 2022-04-06 DIAGNOSIS — M9902 Segmental and somatic dysfunction of thoracic region: Secondary | ICD-10-CM | POA: Diagnosis not present

## 2022-04-08 DIAGNOSIS — H40013 Open angle with borderline findings, low risk, bilateral: Secondary | ICD-10-CM | POA: Diagnosis not present

## 2022-04-08 DIAGNOSIS — H532 Diplopia: Secondary | ICD-10-CM | POA: Diagnosis not present

## 2022-04-16 DIAGNOSIS — M9902 Segmental and somatic dysfunction of thoracic region: Secondary | ICD-10-CM | POA: Diagnosis not present

## 2022-04-16 DIAGNOSIS — M9903 Segmental and somatic dysfunction of lumbar region: Secondary | ICD-10-CM | POA: Diagnosis not present

## 2022-04-16 DIAGNOSIS — M5134 Other intervertebral disc degeneration, thoracic region: Secondary | ICD-10-CM | POA: Diagnosis not present

## 2022-04-16 DIAGNOSIS — M5136 Other intervertebral disc degeneration, lumbar region: Secondary | ICD-10-CM | POA: Diagnosis not present

## 2022-04-23 DIAGNOSIS — M5134 Other intervertebral disc degeneration, thoracic region: Secondary | ICD-10-CM | POA: Diagnosis not present

## 2022-04-23 DIAGNOSIS — M9903 Segmental and somatic dysfunction of lumbar region: Secondary | ICD-10-CM | POA: Diagnosis not present

## 2022-04-23 DIAGNOSIS — M9902 Segmental and somatic dysfunction of thoracic region: Secondary | ICD-10-CM | POA: Diagnosis not present

## 2022-04-23 DIAGNOSIS — M5136 Other intervertebral disc degeneration, lumbar region: Secondary | ICD-10-CM | POA: Diagnosis not present

## 2022-05-18 DIAGNOSIS — M5136 Other intervertebral disc degeneration, lumbar region: Secondary | ICD-10-CM | POA: Diagnosis not present

## 2022-05-18 DIAGNOSIS — M5134 Other intervertebral disc degeneration, thoracic region: Secondary | ICD-10-CM | POA: Diagnosis not present

## 2022-05-18 DIAGNOSIS — M9903 Segmental and somatic dysfunction of lumbar region: Secondary | ICD-10-CM | POA: Diagnosis not present

## 2022-05-18 DIAGNOSIS — M9902 Segmental and somatic dysfunction of thoracic region: Secondary | ICD-10-CM | POA: Diagnosis not present

## 2022-05-27 DIAGNOSIS — M9903 Segmental and somatic dysfunction of lumbar region: Secondary | ICD-10-CM | POA: Diagnosis not present

## 2022-05-27 DIAGNOSIS — M9902 Segmental and somatic dysfunction of thoracic region: Secondary | ICD-10-CM | POA: Diagnosis not present

## 2022-05-27 DIAGNOSIS — M5136 Other intervertebral disc degeneration, lumbar region: Secondary | ICD-10-CM | POA: Diagnosis not present

## 2022-05-27 DIAGNOSIS — M5134 Other intervertebral disc degeneration, thoracic region: Secondary | ICD-10-CM | POA: Diagnosis not present

## 2022-06-03 DIAGNOSIS — M5134 Other intervertebral disc degeneration, thoracic region: Secondary | ICD-10-CM | POA: Diagnosis not present

## 2022-06-03 DIAGNOSIS — M9903 Segmental and somatic dysfunction of lumbar region: Secondary | ICD-10-CM | POA: Diagnosis not present

## 2022-06-03 DIAGNOSIS — M5136 Other intervertebral disc degeneration, lumbar region: Secondary | ICD-10-CM | POA: Diagnosis not present

## 2022-06-03 DIAGNOSIS — M9902 Segmental and somatic dysfunction of thoracic region: Secondary | ICD-10-CM | POA: Diagnosis not present

## 2022-07-07 DIAGNOSIS — M9905 Segmental and somatic dysfunction of pelvic region: Secondary | ICD-10-CM | POA: Diagnosis not present

## 2022-07-07 DIAGNOSIS — M5136 Other intervertebral disc degeneration, lumbar region: Secondary | ICD-10-CM | POA: Diagnosis not present

## 2022-07-07 DIAGNOSIS — M9904 Segmental and somatic dysfunction of sacral region: Secondary | ICD-10-CM | POA: Diagnosis not present

## 2022-07-07 DIAGNOSIS — M9903 Segmental and somatic dysfunction of lumbar region: Secondary | ICD-10-CM | POA: Diagnosis not present

## 2022-07-14 DIAGNOSIS — M9905 Segmental and somatic dysfunction of pelvic region: Secondary | ICD-10-CM | POA: Diagnosis not present

## 2022-07-14 DIAGNOSIS — M5136 Other intervertebral disc degeneration, lumbar region: Secondary | ICD-10-CM | POA: Diagnosis not present

## 2022-07-14 DIAGNOSIS — M9903 Segmental and somatic dysfunction of lumbar region: Secondary | ICD-10-CM | POA: Diagnosis not present

## 2022-07-14 DIAGNOSIS — M9904 Segmental and somatic dysfunction of sacral region: Secondary | ICD-10-CM | POA: Diagnosis not present

## 2022-07-28 DIAGNOSIS — M9905 Segmental and somatic dysfunction of pelvic region: Secondary | ICD-10-CM | POA: Diagnosis not present

## 2022-07-28 DIAGNOSIS — M9903 Segmental and somatic dysfunction of lumbar region: Secondary | ICD-10-CM | POA: Diagnosis not present

## 2022-07-28 DIAGNOSIS — M5136 Other intervertebral disc degeneration, lumbar region: Secondary | ICD-10-CM | POA: Diagnosis not present

## 2022-07-28 DIAGNOSIS — M9904 Segmental and somatic dysfunction of sacral region: Secondary | ICD-10-CM | POA: Diagnosis not present

## 2022-08-04 ENCOUNTER — Ambulatory Visit (INDEPENDENT_AMBULATORY_CARE_PROVIDER_SITE_OTHER): Payer: Medicare HMO | Admitting: Nurse Practitioner

## 2022-08-04 ENCOUNTER — Ambulatory Visit
Admission: RE | Admit: 2022-08-04 | Discharge: 2022-08-04 | Disposition: A | Discharge status: Home / Self Care | Payer: Medicare HMO | Source: Ambulatory Visit | Attending: Nurse Practitioner | Admitting: Nurse Practitioner

## 2022-08-04 ENCOUNTER — Encounter: Payer: Self-pay | Admitting: Nurse Practitioner

## 2022-08-04 VITALS — BP 128/80 | HR 62 | Temp 97.5°F | Ht 66.0 in | Wt 195.0 lb

## 2022-08-04 DIAGNOSIS — J40 Bronchitis, not specified as acute or chronic: Secondary | ICD-10-CM

## 2022-08-04 DIAGNOSIS — I10 Essential (primary) hypertension: Secondary | ICD-10-CM | POA: Diagnosis not present

## 2022-08-04 DIAGNOSIS — R051 Acute cough: Secondary | ICD-10-CM

## 2022-08-04 DIAGNOSIS — R059 Cough, unspecified: Secondary | ICD-10-CM | POA: Diagnosis not present

## 2022-08-04 MED ORDER — ALBUTEROL SULFATE HFA 108 (90 BASE) MCG/ACT IN AERS: 2.0000 | INHALATION_SPRAY | Freq: Four times a day (QID) | RESPIRATORY_TRACT | 2 refills | Status: AC | PRN

## 2022-08-04 MED ORDER — DEXAMETHASONE 4 MG PO TABS: ORAL_TABLET | ORAL | 0 refills | Status: DC

## 2022-08-04 MED ORDER — LEVOFLOXACIN 750 MG PO TABS: 750.0000 mg | ORAL_TABLET | Freq: Every day | ORAL | 0 refills | Status: AC

## 2022-08-04 NOTE — Patient Instructions (Addendum)
Chest xray ordered at [facility] [place] M-F 8:30- 3:45  Levaquin 1 tab daily with largest meal Dexamethasone 3 tabs for 3 days, 2 tabs for 3 days and 1 tab for 5 days  Use Albuterol inhaler 2 puffs every 6 hours as needed for shortness of breath   Acute Bronchitis, Adult  Acute bronchitis is sudden inflammation of the main airways (bronchi) that come off the windpipe (trachea) in the lungs. The swelling causes the airways to get smaller and make more mucus than normal. This can make it hard to breathe and can cause coughing or noisy breathing (wheezing). Acute bronchitis may last several weeks. The cough may last longer. Allergies, asthma, and exposure to smoke may make the condition worse. What are the causes? This condition can be caused by germs and by substances that irritate the lungs, including: Cold and flu viruses. The most common cause of this condition is the virus that causes the common cold. Bacteria. This is less common. Breathing in substances that irritate the lungs, including: Smoke from cigarettes and other forms of tobacco. Dust and pollen. Fumes from household cleaning products, gases, or burned fuel. Indoor or outdoor air pollution. What increases the risk? The following factors may make you more likely to develop this condition: A weak body's defense system, also called the immune system. A condition that affects your lungs and breathing, such as asthma. What are the signs or symptoms? Common symptoms of this condition include: Coughing. This may bring up clear, yellow, or green mucus from your lungs (sputum). Wheezing. Runny or stuffy nose. Having too much mucus in your lungs (chest congestion). Shortness of breath. Aches and pains, including sore throat or chest. How is this diagnosed? This condition is usually diagnosed based on: Your symptoms and medical history. A physical exam. You may also have other tests,  including tests to rule out other conditions, such as pneumonia. These tests include: A test of lung function. Test of a mucus sample to look for the presence of bacteria. Tests to check the oxygen level in your blood. Blood tests. Chest X-ray. How is this treated? Most cases of acute bronchitis clear up over time without treatment. Your health care provider may recommend: Drinking more fluids to help thin your mucus so it is easier to cough up. Taking inhaled medicine (inhaler) to improve air flow in and out of your lungs. Using a vaporizer or a humidifier. These are machines that add water to the air to help you breathe better. Taking a medicine that thins mucus and clears congestion (expectorant). Taking a medicine that prevents or stops coughing (cough suppressant). It is not common to take an antibiotic medicine for this condition. Follow these instructions at home:  Take over-the-counter and prescription medicines only as told by your health care provider. Use an inhaler, vaporizer, or humidifier as told by your health care provider. Take two teaspoons (10 mL) of honey at bedtime to lessen coughing at night. Drink enough fluid to keep your urine pale yellow. Do not use any products that contain nicotine or tobacco. These products include cigarettes, chewing tobacco, and vaping devices, such as e-cigarettes. If you need help quitting, ask your health care provider. Get plenty of rest. Return to your normal activities as told by your health care provider. Ask your health care provider what activities are safe for you. Keep all follow-up visits. This is important. How is this prevented? To lower your risk of getting this condition again:  Wash your hands often with soap and water for at least 20 seconds. If soap and water are not available, use hand sanitizer. Avoid contact with people who have cold symptoms. Try not to touch your mouth, nose, or eyes with your hands. Avoid breathing  in smoke or chemical fumes. Breathing smoke or chemical fumes will make your condition worse. Get the flu shot every year. Contact a health care provider if: Your symptoms do not improve after 2 weeks. You have trouble coughing up the mucus. Your cough keeps you awake at night. You have a fever. Get help right away if you: Cough up blood. Feel pain in your chest. Have severe shortness of breath. Faint or keep feeling like you are going to faint. Have a severe headache. Have a fever or chills that get worse. These symptoms may represent a serious problem that is an emergency. Do not wait to see if the symptoms will go away. Get medical help right away. Call your local emergency services (911 in the [place] Do not drive yourself to the hospital. Summary Acute bronchitis is inflammation of the main airways (bronchi) that come off the windpipe (trachea) in the lungs. The swelling causes the airways to get smaller and make more mucus than normal. Drinking more fluids can help thin your mucus so it is easier to cough up. Take over-the-counter and prescription medicines only as told by your health care provider. Do not use any products that contain nicotine or tobacco. These products include cigarettes, chewing tobacco, and vaping devices, such as e-cigarettes. If you need help quitting, ask your health care provider. Contact a health care provider if your symptoms do not improve after 2 weeks. This information is not intended to replace advice given to you by your health care provider. Make sure you discuss any questions you have with your health care provider. Document Revised: 01/01/2022 Document Reviewed: 01/22/2021 Elsevier Patient Education  2023 [employer].

## 2022-08-04 NOTE — Progress Notes (Signed)
Assessment and Plan:  Denise Jensen was seen today for cough and nasal congestion.  Diagnoses and all orders for this visit:  Bronchitis Push fluids, Mucinex as needed Levaquin with biggest meal of the day and take probiotic Had reaction to dexamethasone so will hold.  If symptoms do not resolve can consider Prednisone.  If develops fever > 102, unable to catch her breath or symptoms worsen she is to go to the ER -     levofloxacin (LEVAQUIN) 750 MG tablet; Take 1 tablet (750 mg total) by mouth daily for 10 days. -     DG Chest 2 View; Future -     albuterol (VENTOLIN HFA) 108 (90 Base) MCG/ACT inhaler; Inhale 2 puffs into the lungs every 6 (six) hours as needed for wheezing or shortness of breath.  Essential hypertension - continue medications, DASH diet, exercise and monitor at home. Call if greater than 130/80.   Acute cough -     DG Chest 2 View; Future       Further disposition pending results of labs. Discussed med's effects and SE's.   Over 30 minutes of exam, counseling, chart review, and critical decision making was performed.   Future Appointments  Date Time Provider Department Center  08/18/2022  3:00 PM Adela Glimpse, NP [facility] None  12/24/2022 11:00 AM [place], Archie Patten, NP [facility] None    ------------------------------------------------------------------------------------------------------------------   HPI BP 128/80   Pulse 62   Temp (!) 97.5 F (36.4 C)   Ht 5\' 6"  (1.676 m)   Wt 195 lb (88.5 kg)   LMP  (LMP Unknown)   SpO2 94%   BMI 31.47 kg/m   66 y.o.female presents for  cough and congestion x 3 weeks. She has worsening of cough in the morning and will cough so much she will throw up.   She is bringing up mucus- yellow in color. She does also have some noted fatigue and shortness of breath with exertion. She has not tried any over the counter medications. She did take a covid test which was negative. She denies a fever and sore throat.    BP is  currently well Controlled with Ziac 5/6.25 mg QD,  Denies headaches, chest pain and dizziness.  BP Readings from Last 3 Encounters:  08/04/22 128/80  12/23/21 110/74  08/25/21 (!) 148/91      Past Medical History:  Diagnosis Date   GERD (gastroesophageal reflux disease)    HTN (hypertension)    Unspecified hypothyroidism    Unspecified vitamin D deficiency      Allergies  Allergen Reactions   Codeine     REACTION: hallucinations   Mometasone Furoate     REACTION: headache   Zetia [Ezetimibe] Diarrhea and Nausea Only    Prob her sx's were due to IBS & Coincidental     Current Outpatient Medications on File Prior to Visit  Medication Sig   bisoprolol-hydrochlorothiazide (ZIAC) 5-6.25 MG tablet TAKE 1 TABLET DAILY FOR BLOOD PRESSURE.   cholecalciferol (VITAMIN D) 1000 units tablet Take 5,000 Units by mouth daily.   esomeprazole (NEXIUM) 20 MG capsule Take 20 mg by mouth daily at 12 noon.   ezetimibe (ZETIA) 10 MG tablet Take 1 tablet Daily for Cholesterol to Prevent Heart Attacks, strokes  & Dementia   Fexofenadine HCl (ALLEGRA PO) Take by mouth.   levothyroxine (SYNTHROID) 112 MCG tablet TAKE 1 TABLET DAILY EXCEPT 1/2 TAB ON Sunday. TAKE ON AN EMPTY STOMACH WITH ONLY WATER FOR 30 MINUTES & NO ANTACID MEDS,  CALCIUM OR MAGNESIUM FOR 4 HOURS & AVOID BIOTIN   meclizine (ANTIVERT) 25 MG tablet Take 1 tablet (25 mg total) by mouth 3 (three) times daily as needed for dizziness.   Meclizine HCl (BONINE PO) Take by mouth. PRN   Triamcinolone Acetonide (NASACORT AQ NA) Place 1 spray into the nose daily as needed. OTC   Levocetirizine Dihydrochloride (XYZAL PO) Take 1 tablet by mouth daily. (Patient not taking: Reported on 08/04/2022)   No current facility-administered medications on file prior to visit.    ROS: all negative except above.   Physical Exam:  BP 128/80   Pulse 62   Temp (!) 97.5 F (36.4 C)   Ht 5\' 6"  (1.676 m)   Wt 195 lb (88.5 kg)   LMP  (LMP Unknown)   SpO2  94%   BMI 31.47 kg/m   General Appearance: Well nourished, in no apparent distress. Eyes: PERRLA, EOMs, conjunctiva no swelling or erythema Sinuses: No Frontal/maxillary tenderness ENT/Mouth: Ext aud canals clear, TMs without erythema, bulging. No erythema, swelling, or exudate on post pharynx.  Tonsils not swollen or erythematous. Hearing normal.  Neck: Supple, thyroid normal.  Respiratory: Respiratory effort labored. Crackles and wheezes right lower lobe, coughing throughout visit Cardio: RRR with no MRGs. Brisk peripheral pulses without edema.  Abdomen: Soft, + BS.  Non tender, no guarding, rebound, hernias, masses. Lymphatics: Non tender without lymphadenopathy.  Musculoskeletal: Full ROM, 5/5 strength, normal gait.  Skin: Warm, dry without rashes, lesions, ecchymosis.  Neuro: Cranial nerves intact. Normal muscle tone, no cerebellar symptoms. Sensation intact.  Psych: Awake and oriented X 3, normal affect, Insight and Judgment appropriate.     Alycia Rossetti, NP 2:40 PM [facility]

## 2022-08-07 NOTE — Progress Notes (Signed)
LMOM THAT CHEST X RAY WAS NORMAL.

## 2022-08-11 DIAGNOSIS — M9904 Segmental and somatic dysfunction of sacral region: Secondary | ICD-10-CM | POA: Diagnosis not present

## 2022-08-11 DIAGNOSIS — M5136 Other intervertebral disc degeneration, lumbar region: Secondary | ICD-10-CM | POA: Diagnosis not present

## 2022-08-11 DIAGNOSIS — M9905 Segmental and somatic dysfunction of pelvic region: Secondary | ICD-10-CM | POA: Diagnosis not present

## 2022-08-11 DIAGNOSIS — M9903 Segmental and somatic dysfunction of lumbar region: Secondary | ICD-10-CM | POA: Diagnosis not present

## 2022-08-18 ENCOUNTER — Encounter: Payer: Medicare HMO | Admitting: Nurse Practitioner

## 2022-08-25 DIAGNOSIS — M9905 Segmental and somatic dysfunction of pelvic region: Secondary | ICD-10-CM | POA: Diagnosis not present

## 2022-08-25 DIAGNOSIS — M9903 Segmental and somatic dysfunction of lumbar region: Secondary | ICD-10-CM | POA: Diagnosis not present

## 2022-08-25 DIAGNOSIS — M5136 Other intervertebral disc degeneration, lumbar region: Secondary | ICD-10-CM | POA: Diagnosis not present

## 2022-08-25 DIAGNOSIS — M9904 Segmental and somatic dysfunction of sacral region: Secondary | ICD-10-CM | POA: Diagnosis not present

## 2022-09-03 NOTE — Progress Notes (Signed)
CPE  Assessment / Plan:    Denise Jensen was seen today for annual exam.  Diagnoses and all orders for this visit:  Encounter for Annual Physical Exam with abnormal findings Due annually  Health Maintenance reviewed Healthy lifestyle reviewed and goals set Strongly encouraged to schedule mammogram  LBBB (left bundle branch block) Denies any concerning sx; est with cardiology if needed  Monitor closely, reduce BB if needed - EKG  Hypothyroidism, unspecified type Taking levothyroxine 112 mcg whote tablet 5 days a week and half tablet 2 days a week. Reminder to take on an empty stomach 30-8mins before first meal of the day. No antacid medications for 4 hours. -     TSH  Essential hypertension Continue current medications: Ziac 5/6.25 mg daily Monitor blood pressure at home; call if consistently over 130/80 Continue DASH diet.   Reminder to go to the ER if any CP, SOB, nausea, dizziness, severe HA, changes vision/speech, left arm numbness and tingling and jaw pain.  Hyperlipidemia, mixed Zetia 10mg .  Discussed dietary and exercise modifications Low fat diet -     Lipid panel  Vitamin D deficiency Continue supplementation to maintain goal of 70-100 Taking  5000 IU daily - verify -     VITAMIN D 25 Hydroxy (Vit-D Deficiency, Fractures)  Fibromyalgia Lifestyle reivewed, doing fairly   Allergic rhinitis due to pollen, unspecified seasonality Switch from Allegra to Zyrtec, begin nasacort daily Follow up in office if not resolving.   Obesity - BMI 30 Long discussion about weight loss, diet, and exercise Recommended diet heavy in fruits and veggies and low in animal meats, cheeses, and dairy products, appropriate calorie intake Patient will work on adding resistance exercises to build muscle mass, continue portion control, avoid processed carbs Discussed appropriate weight for height  Follow up at next visit  Estrogen deficiency -     DG Bone Density; Future - patient  declines  Abnormal glucose Continue diet and exercise -     Hemoglobin A1c  Chronic anxiety Managing without medication Stress management techniques discussed, increase water, good sleep hygiene discussed, increase exercise, and increase veggies.    Medication management -     CBC with Differential/Platelet -     COMPLETE METABOLIC PANEL WITH GFR -     Magnesium -     Lipid panel -     TSH -     Hemoglobin A1c -     VITAMIN D 25 Hydroxy (Vit-D Deficiency, Fractures) -     EKG 12-Lead -     [place], RETROPERITNL ABD,  LTD -     Urinalysis w microscopic + reflex cultur -     Microalbumin / creatinine urine ratio   Screening for hematuria or proteinuria -     Urinalysis w microscopic + reflex cultur -     Microalbumin / creatinine urine ratio  Screening for ischemic heart disease -     EKG 12-Lead  Screening for AAA (abdominal aortic aneurysm) -     [place], RETROPERITNL ABD,  LTD           Further disposition pending results if labs check today. Discussed med's effects and SE's.   Over 40 minutes of face to face interview, exam, counseling, chart review, and critical decision making was performed.   Future Appointments  Date Time Provider [place]  12/24/2022 11:00 AM Darrol Jump, NP [facility] None  09/08/2023  2:00 PM Alycia Rossetti, NP [facility] None      --------------------------------------------------------------------------------------------------  HPI  66 y.o. female  presents for CPE. 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  Married, 1 child, 2 grandchildren in [place]. She  had to take care of her husband who was dealing with cancer and 3 friends with Alzheimers. She has started to learn to say no to be able to get some time for herself. Her husband is to have hip surgery in January. She is retired from Teacher, music.  Her granddaughter was 56 and got covid and was in ICU and had to be resuscitated 2 years ago, now 81 but is now doing well    She continues to cough up clear to yellow thick mucus. Continues to use nebulizer. Has not been using nasal steroid and has been on same antihistamine for many months.   BMI is Body mass index is 30.82 kg/m., she has been working on diet, will do smaller servings. Admits hasn't been exercising but thinks she needs to restart walking to help with weight. Is active in home and garden.  Wt Readings from Last 3 Encounters:  09/07/22 196 lb 12.8 oz (89.3 kg)  08/04/22 195 lb (88.5 kg)  12/23/21 197 lb 6.4 oz (89.5 kg)   She has LBBB, had exertional dyspnea in 2019 and saw cardiology Dr. Julianne Jensen, ECHO 01/03/2018 showed LVEF 45-50% with hypokinesis/inferior/septal walls, mild LVH, grade 1 diastolic dysfunction, mild MR.   Her blood pressure has been controlled at home, currently on Ziac 5/6.25 mg QD, today their BP is BP: 128/84.  BP Readings from Last 3 Encounters:  09/07/22 128/84  08/04/22 128/80  12/23/21 110/74  She does not workout. She denies chest pain, shortness of breath, dizziness.     She is on cholesterol medication and taking zetia 10 mg daily. Her LDL cholesterol is at goal. The cholesterol last visit was:  Lab Results  Component Value Date   CHOL 186 12/23/2021   HDL 57 12/23/2021   LDLCALC 97 12/23/2021   LDLDIRECT 75.3 09/11/2010   TRIG 229 (H) 12/23/2021   CHOLHDL 3.3 12/23/2021  . Last A1C in the office was:  Lab Results  Component Value Date   HGBA1C 5.6 12/23/2021   Patient is on Vitamin D supplement, she reduced by half since 2021, ? 5000 IU Lab Results  Component Value  Date   VD25OH 73 08/14/2021     She is on thyroid medication. Her medication was not changed last visit: Levothyroxine 112 mcg whole tablet six days a week and half tablet on Sunday. Asymptomatic today.  Lab Results  Component Value Date   TSH 5.22 (H) 12/23/2021     Current Medications:   Current Outpatient Medications (Endocrine & Metabolic):    levothyroxine (SYNTHROID) 112 MCG tablet, TAKE 1 TABLET DAILY EXCEPT 1/2 TAB ON Sunday. TAKE ON AN EMPTY STOMACH WITH ONLY WATER FOR 30 MINUTES & NO ANTACID MEDS, CALCIUM OR MAGNESIUM FOR 4 HOURS & AVOID BIOTIN  Current Outpatient Medications (Cardiovascular):    bisoprolol-hydrochlorothiazide (ZIAC) 5-6.25 MG tablet, TAKE 1 TABLET DAILY FOR BLOOD PRESSURE.   ezetimibe (ZETIA) 10 MG tablet, Take 1 tablet Daily for Cholesterol to Prevent Heart Attacks, strokes  & Dementia  Current Outpatient Medications (Respiratory):    albuterol (VENTOLIN HFA) 108 (90 Base) MCG/ACT inhaler, Inhale 2  puffs into the lungs every 6 (six) hours as needed for wheezing or shortness of breath.   Fexofenadine HCl (ALLEGRA PO), Take by mouth.   Levocetirizine Dihydrochloride (XYZAL PO), Take 1 tablet by mouth daily.   Triamcinolone Acetonide (NASACORT AQ NA), Place 1 spray into the nose daily as needed. OTC (Patient not taking: Reported on 09/07/2022)    Current Outpatient Medications (Other):    cholecalciferol (VITAMIN D) 1000 units tablet, Take 5,000 Units by mouth daily.   esomeprazole (NEXIUM) 20 MG capsule, Take 20 mg by mouth daily at 12 noon.   meclizine (ANTIVERT) 25 MG tablet, Take 1 tablet (25 mg total) by mouth 3 (three) times daily as needed for dizziness.   Meclizine HCl (BONINE PO), Take by mouth. PRN  Health Maintenance:   Immunization History  Administered Date(s) Administered   Influenza Split 07/10/2014, 08/06/2015   Influenza Whole 07/06/2007   Influenza, High Dose Seasonal PF 08/14/2021   Influenza,inj,quad, With Preservative 09/07/2016    Td 10/05/2001   Tdap 08/06/2015   Tetanus: 2016 Pneumovax: declines to next OV Flu vaccine: 2020, TODAY  Shingrix: check with insurance  Covid 19: 2/2, 2021, moderna, requested date  Pap: 2008, declined further  MGM: 04/2017, ordered to schedule at  DEXA: declines  Colonoscopy: couldn't tolerate prep, declines  Willing to do cologuard, ordered 01/04/20, 12/23/2020 - hasn't completed   Last Dental Exam:  2017 Last Eye Exam:  [facility]  Patient Care Team: Lucky Cowboy, MD as PCP - General (Internal Medicine)  Medical History:  Past Medical History:  Diagnosis Date   GERD (gastroesophageal reflux disease)    HTN (hypertension)    Unspecified hypothyroidism    Unspecified vitamin D deficiency    Allergies Allergies  Allergen Reactions   Codeine     REACTION: hallucinations   Doxycycline Other (See Comments)    Double vision   Mometasone Furoate     REACTION: headache   Zetia [Ezetimibe] Diarrhea and Nausea Only    Prob her sx's were due to IBS & Coincidental     SURGICAL HISTORY She  has a past surgical history that includes No past surgeries. FAMILY HISTORY Her family history includes Arthritis in her mother; Breast cancer (age of onset: 38) in her sister; Depression in her mother and sister; Heart disease in her father; Hypertension in her mother; Kidney disease in her mother. SOCIAL HISTORY She  reports that she quit smoking about 40 years ago. Her smoking use included cigarettes. She started smoking about 50 years ago. She has a 5.00 pack-year smoking history. She has never used smokeless tobacco. She reports that she does not drink alcohol and does not use drugs.  Smoked 10-15 years  Review of Systems: Review of Systems  Constitutional:  Negative for malaise/fatigue and weight loss.  HENT:  Positive for congestion. Negative for ear discharge, hearing loss, nosebleeds, sinus pain, sore throat and tinnitus.   Eyes:  Negative for blurred vision  and double vision.  Respiratory:  Positive for cough (post nasal drip). Negative for sputum production, shortness of breath and wheezing.   Cardiovascular:  Negative for chest pain, palpitations, orthopnea, claudication, leg swelling and PND.  Gastrointestinal:  Negative for abdominal pain, blood in stool, constipation, diarrhea, heartburn, melena, nausea and vomiting.  Genitourinary: Negative.   Musculoskeletal:  Negative for falls, joint pain and myalgias.  Skin:  Negative for rash.  Neurological:  Negative for dizziness, tingling, sensory change, weakness and headaches.  Endo/Heme/Allergies:  Positive for environmental allergies. Negative  for polydipsia.  Psychiatric/Behavioral: Negative.  Negative for depression, memory loss, substance abuse and suicidal ideas. The patient is not nervous/anxious and does not have insomnia.   All other systems reviewed and are negative.   Physical Exam: Estimated body mass index is 30.82 kg/m as calculated from the following:   Height as of this encounter: 5\' 7"  (1.702 m).   Weight as of this encounter: 196 lb 12.8 oz (89.3 kg). BP 128/84   Pulse 66   Temp (!) 97.5 F (36.4 C)   Ht 5\' 7"  (1.702 m)   Wt 196 lb 12.8 oz (89.3 kg)   LMP  (LMP Unknown)   SpO2 98%   BMI 30.82 kg/m   General Appearance: Well nourished well developed, in no apparent distress.  Eyes: PERRLA, EOMs, conjunctiva no swelling or erythema ENT/Mouth: Left Ear canal obstructed by dry wax, Right canal clear.  R TM normal bilaterally with no erythema, bulging, retraction, or loss of landmark.  Oropharynx moist and clear with no exudate, erythema, or swelling.   Neck: Supple, thyroid normal. No bruits.  No cervical adenopathy Respiratory: Respiratory effort normal, Breath sounds clear A&P without wheeze, rhonchi, rales.   Cardio: RRR without murmurs, rubs or gallops. Brisk peripheral pulses without edema.  Chest: symmetric, with normal excursions Abdomen: Soft, nontender, no  guarding, rebound, hernias, masses, or organomegaly.  Lymphatics: Non tender without lymphadenopathy.  Genitourinary: Patient declined pap smear and pelvic exam  Musculoskeletal: Full ROM all peripheral extremities,5/5 strength, and normal gait.  Skin: Warm, dry without rashes, lesions, ecchymosis. Neuro: Awake and oriented X 3, Cranial nerves intact, reflexes equal bilaterally. Normal muscle tone, no cerebellar symptoms. Sensation intact.  Psych:  normal affect, Insight and Judgment appropriate.  Breasts: Patient declined manual exam, mammogram strongly encouraged to schedule  EKG: sinus bradycardia, LBBB, NSCPT AAA: < 3 cm  Tyrhonda Georgiades Mikki Santee, NP 2:38 PM [facility]

## 2022-09-07 ENCOUNTER — Ambulatory Visit (INDEPENDENT_AMBULATORY_CARE_PROVIDER_SITE_OTHER): Payer: Medicare HMO | Admitting: Nurse Practitioner

## 2022-09-07 ENCOUNTER — Encounter: Payer: Self-pay | Admitting: Nurse Practitioner

## 2022-09-07 VITALS — BP 128/84 | HR 66 | Temp 97.5°F | Ht 67.0 in | Wt 196.8 lb

## 2022-09-07 DIAGNOSIS — Z136 Encounter for screening for cardiovascular disorders: Secondary | ICD-10-CM

## 2022-09-07 DIAGNOSIS — Z79899 Other long term (current) drug therapy: Secondary | ICD-10-CM | POA: Diagnosis not present

## 2022-09-07 DIAGNOSIS — I447 Left bundle-branch block, unspecified: Secondary | ICD-10-CM

## 2022-09-07 DIAGNOSIS — Z1389 Encounter for screening for other disorder: Secondary | ICD-10-CM

## 2022-09-07 DIAGNOSIS — Z0001 Encounter for general adult medical examination with abnormal findings: Secondary | ICD-10-CM

## 2022-09-07 DIAGNOSIS — I700 Atherosclerosis of aorta: Secondary | ICD-10-CM | POA: Diagnosis not present

## 2022-09-07 DIAGNOSIS — Z0000 Encounter for general adult medical examination without abnormal findings: Secondary | ICD-10-CM

## 2022-09-07 DIAGNOSIS — I10 Essential (primary) hypertension: Secondary | ICD-10-CM

## 2022-09-07 DIAGNOSIS — E782 Mixed hyperlipidemia: Secondary | ICD-10-CM

## 2022-09-07 DIAGNOSIS — E039 Hypothyroidism, unspecified: Secondary | ICD-10-CM | POA: Diagnosis not present

## 2022-09-07 DIAGNOSIS — J301 Allergic rhinitis due to pollen: Secondary | ICD-10-CM

## 2022-09-07 DIAGNOSIS — R7309 Other abnormal glucose: Secondary | ICD-10-CM

## 2022-09-07 DIAGNOSIS — E559 Vitamin D deficiency, unspecified: Secondary | ICD-10-CM

## 2022-09-07 DIAGNOSIS — M797 Fibromyalgia: Secondary | ICD-10-CM

## 2022-09-07 DIAGNOSIS — E663 Overweight: Secondary | ICD-10-CM

## 2022-09-07 DIAGNOSIS — F419 Anxiety disorder, unspecified: Secondary | ICD-10-CM

## 2022-09-07 NOTE — Patient Instructions (Signed)

## 2022-09-08 DIAGNOSIS — M5136 Other intervertebral disc degeneration, lumbar region: Secondary | ICD-10-CM | POA: Diagnosis not present

## 2022-09-08 DIAGNOSIS — M9903 Segmental and somatic dysfunction of lumbar region: Secondary | ICD-10-CM | POA: Diagnosis not present

## 2022-09-08 DIAGNOSIS — M9905 Segmental and somatic dysfunction of pelvic region: Secondary | ICD-10-CM | POA: Diagnosis not present

## 2022-09-08 DIAGNOSIS — M9904 Segmental and somatic dysfunction of sacral region: Secondary | ICD-10-CM | POA: Diagnosis not present

## 2022-09-08 LAB — URINALYSIS W MICROSCOPIC + REFLEX CULTURE
Bacteria, UA: NONE SEEN /HPF
Bilirubin Urine: NEGATIVE
Glucose, UA: NEGATIVE
Hgb urine dipstick: NEGATIVE
Hyaline Cast: NONE SEEN /LPF
Ketones, ur: NEGATIVE
Leukocyte Esterase: NEGATIVE
Nitrites, Initial: NEGATIVE
Protein, ur: NEGATIVE
RBC / HPF: NONE SEEN /HPF (ref 0–2)
Specific Gravity, Urine: 1.017 (ref 1.001–1.035)
WBC, UA: NONE SEEN /HPF (ref 0–5)
pH: 6 (ref 5.0–8.0)

## 2022-09-08 LAB — TSH: TSH: 2.55 mIU/L (ref 0.40–4.50)

## 2022-09-08 LAB — CBC WITH DIFFERENTIAL/PLATELET
Absolute Monocytes: 598 {cells}/uL (ref 200–950)
Basophils Absolute: 58 {cells}/uL (ref 0–200)
Basophils Relative: 0.7 %
Eosinophils Absolute: 100 {cells}/uL (ref 15–500)
Eosinophils Relative: 1.2 %
HCT: 39.8 % (ref 35.0–45.0)
Hemoglobin: 13.8 g/dL (ref 11.7–15.5)
Lymphs Abs: 2606 {cells}/uL (ref 850–3900)
MCH: 30.7 pg (ref 27.0–33.0)
MCHC: 34.7 g/dL (ref 32.0–36.0)
MCV: 88.4 fL (ref 80.0–100.0)
MPV: 9.3 fL (ref 7.5–12.5)
Monocytes Relative: 7.2 %
Neutro Abs: 4939 {cells}/uL (ref 1500–7800)
Neutrophils Relative %: 59.5 %
Platelets: 301 10*3/uL (ref 140–400)
RBC: 4.5 Million/uL (ref 3.80–5.10)
RDW: 13.4 % (ref 11.0–15.0)
Total Lymphocyte: 31.4 %
WBC: 8.3 10*3/uL (ref 3.8–10.8)

## 2022-09-08 LAB — COMPLETE METABOLIC PANEL WITHOUT GFR
AG Ratio: 1.4 (calc) (ref 1.0–2.5)
ALT: 11 U/L (ref 6–29)
AST: 18 U/L (ref 10–35)
Albumin: 4.4 g/dL (ref 3.6–5.1)
Alkaline phosphatase (APISO): 115 U/L (ref 37–153)
BUN: 15 mg/dL (ref 7–25)
CO2: 29 mmol/L (ref 20–32)
Calcium: 9.9 mg/dL (ref 8.6–10.4)
Chloride: 101 mmol/L (ref 98–110)
Creat: 0.95 mg/dL (ref 0.50–1.05)
Globulin: 3.2 g/dL (ref 1.9–3.7)
Glucose, Bld: 94 mg/dL (ref 65–99)
Potassium: 3.9 mmol/L (ref 3.5–5.3)
Sodium: 141 mmol/L (ref 135–146)
Total Bilirubin: 0.5 mg/dL (ref 0.2–1.2)
Total Protein: 7.6 g/dL (ref 6.1–8.1)
eGFR: 66 mL/min/{1.73_m2}

## 2022-09-08 LAB — LIPID PANEL
Cholesterol: 184 mg/dL (ref <200)
HDL: 57 mg/dL (ref >=50)
LDL Cholesterol (Calc): 91 mg/dL (calc)
Non-HDL Cholesterol (Calc): 127 mg/dL (calc) (ref <130)
Total CHOL/HDL Ratio: 3.2 (calc) (ref <5.0)
Triglycerides: 271 mg/dL — ABNORMAL HIGH (ref <150)

## 2022-09-08 LAB — HEMOGLOBIN A1C
Hgb A1c MFr Bld: 5.7 %{Hb} — ABNORMAL HIGH
Mean Plasma Glucose: 117 mg/dL
eAG (mmol/L): 6.5 mmol/L

## 2022-09-08 LAB — MICROALBUMIN / CREATININE URINE RATIO
Creatinine, Urine: 125 mg/dL (ref 20–275)
Microalb Creat Ratio: 5 ug/mg{creat}
Microalb, Ur: 0.6 mg/dL

## 2022-09-08 LAB — MAGNESIUM: Magnesium: 2.1 mg/dL (ref 1.5–2.5)

## 2022-09-08 LAB — VITAMIN D 25 HYDROXY (VIT D DEFICIENCY, FRACTURES): Vit D, 25-Hydroxy: 74 ng/mL (ref 30–100)

## 2022-09-08 LAB — NO CULTURE INDICATED

## 2022-09-09 ENCOUNTER — Encounter: Payer: Medicare HMO | Admitting: Nurse Practitioner

## 2022-09-14 ENCOUNTER — Telehealth: Payer: Self-pay | Admitting: Nurse Practitioner

## 2022-09-14 DIAGNOSIS — E039 Hypothyroidism, unspecified: Secondary | ICD-10-CM

## 2022-09-14 MED ORDER — LEVOTHYROXINE SODIUM 112 MCG PO TABS: ORAL_TABLET | ORAL | 3 refills | Status: DC

## 2022-09-14 NOTE — Addendum Note (Signed)
Addended by: Dionicio Stall on: 09/14/2022 04:25 PM   Modules accepted: Orders

## 2022-09-14 NOTE — Telephone Encounter (Signed)
Requesting refill on Synthroid to go to [facility]

## 2022-09-15 ENCOUNTER — Other Ambulatory Visit: Payer: Self-pay | Admitting: Internal Medicine

## 2022-09-15 DIAGNOSIS — E039 Hypothyroidism, unspecified: Secondary | ICD-10-CM

## 2022-09-15 MED ORDER — LEVOTHYROXINE SODIUM 112 MCG PO TABS: ORAL_TABLET | ORAL | 3 refills | Status: DC

## 2022-09-22 DIAGNOSIS — M9905 Segmental and somatic dysfunction of pelvic region: Secondary | ICD-10-CM | POA: Diagnosis not present

## 2022-09-22 DIAGNOSIS — M5136 Other intervertebral disc degeneration, lumbar region: Secondary | ICD-10-CM | POA: Diagnosis not present

## 2022-09-22 DIAGNOSIS — M9904 Segmental and somatic dysfunction of sacral region: Secondary | ICD-10-CM | POA: Diagnosis not present

## 2022-09-22 DIAGNOSIS — M9903 Segmental and somatic dysfunction of lumbar region: Secondary | ICD-10-CM | POA: Diagnosis not present

## 2022-10-28 DIAGNOSIS — M9905 Segmental and somatic dysfunction of pelvic region: Secondary | ICD-10-CM | POA: Diagnosis not present

## 2022-10-28 DIAGNOSIS — M5136 Other intervertebral disc degeneration, lumbar region: Secondary | ICD-10-CM | POA: Diagnosis not present

## 2022-10-28 DIAGNOSIS — M9904 Segmental and somatic dysfunction of sacral region: Secondary | ICD-10-CM | POA: Diagnosis not present

## 2022-10-28 DIAGNOSIS — M9903 Segmental and somatic dysfunction of lumbar region: Secondary | ICD-10-CM | POA: Diagnosis not present

## 2022-11-25 DIAGNOSIS — M9905 Segmental and somatic dysfunction of pelvic region: Secondary | ICD-10-CM | POA: Diagnosis not present

## 2022-11-25 DIAGNOSIS — M9904 Segmental and somatic dysfunction of sacral region: Secondary | ICD-10-CM | POA: Diagnosis not present

## 2022-11-25 DIAGNOSIS — M9903 Segmental and somatic dysfunction of lumbar region: Secondary | ICD-10-CM | POA: Diagnosis not present

## 2022-11-25 DIAGNOSIS — M5136 Other intervertebral disc degeneration, lumbar region: Secondary | ICD-10-CM | POA: Diagnosis not present

## 2022-12-02 DIAGNOSIS — M9905 Segmental and somatic dysfunction of pelvic region: Secondary | ICD-10-CM | POA: Diagnosis not present

## 2022-12-02 DIAGNOSIS — M5136 Other intervertebral disc degeneration, lumbar region: Secondary | ICD-10-CM | POA: Diagnosis not present

## 2022-12-02 DIAGNOSIS — M9903 Segmental and somatic dysfunction of lumbar region: Secondary | ICD-10-CM | POA: Diagnosis not present

## 2022-12-02 DIAGNOSIS — M9904 Segmental and somatic dysfunction of sacral region: Secondary | ICD-10-CM | POA: Diagnosis not present

## 2022-12-09 DIAGNOSIS — M9903 Segmental and somatic dysfunction of lumbar region: Secondary | ICD-10-CM | POA: Diagnosis not present

## 2022-12-09 DIAGNOSIS — M9905 Segmental and somatic dysfunction of pelvic region: Secondary | ICD-10-CM | POA: Diagnosis not present

## 2022-12-09 DIAGNOSIS — M9904 Segmental and somatic dysfunction of sacral region: Secondary | ICD-10-CM | POA: Diagnosis not present

## 2022-12-09 DIAGNOSIS — M5136 Other intervertebral disc degeneration, lumbar region: Secondary | ICD-10-CM | POA: Diagnosis not present

## 2022-12-24 ENCOUNTER — Encounter: Payer: Self-pay | Admitting: Nurse Practitioner

## 2022-12-24 ENCOUNTER — Ambulatory Visit (INDEPENDENT_AMBULATORY_CARE_PROVIDER_SITE_OTHER): Payer: Medicare HMO | Admitting: Nurse Practitioner

## 2022-12-24 VITALS — BP 130/80 | HR 58 | Temp 97.7°F | Ht 67.0 in | Wt 202.6 lb

## 2022-12-24 DIAGNOSIS — J301 Allergic rhinitis due to pollen: Secondary | ICD-10-CM

## 2022-12-24 DIAGNOSIS — R6889 Other general symptoms and signs: Secondary | ICD-10-CM

## 2022-12-24 DIAGNOSIS — E559 Vitamin D deficiency, unspecified: Secondary | ICD-10-CM

## 2022-12-24 DIAGNOSIS — Z0001 Encounter for general adult medical examination with abnormal findings: Secondary | ICD-10-CM

## 2022-12-24 DIAGNOSIS — I10 Essential (primary) hypertension: Secondary | ICD-10-CM | POA: Diagnosis not present

## 2022-12-24 DIAGNOSIS — E039 Hypothyroidism, unspecified: Secondary | ICD-10-CM | POA: Diagnosis not present

## 2022-12-24 DIAGNOSIS — F419 Anxiety disorder, unspecified: Secondary | ICD-10-CM

## 2022-12-24 DIAGNOSIS — Z0000 Encounter for general adult medical examination without abnormal findings: Secondary | ICD-10-CM

## 2022-12-24 DIAGNOSIS — M797 Fibromyalgia: Secondary | ICD-10-CM | POA: Diagnosis not present

## 2022-12-24 DIAGNOSIS — R2689 Other abnormalities of gait and mobility: Secondary | ICD-10-CM

## 2022-12-24 DIAGNOSIS — E669 Obesity, unspecified: Secondary | ICD-10-CM

## 2022-12-24 DIAGNOSIS — H608X2 Other otitis externa, left ear: Secondary | ICD-10-CM

## 2022-12-24 DIAGNOSIS — Z79899 Other long term (current) drug therapy: Secondary | ICD-10-CM

## 2022-12-24 DIAGNOSIS — E782 Mixed hyperlipidemia: Secondary | ICD-10-CM

## 2022-12-24 DIAGNOSIS — E2839 Other primary ovarian failure: Secondary | ICD-10-CM | POA: Diagnosis not present

## 2022-12-24 DIAGNOSIS — E66811 Obesity, class 1: Secondary | ICD-10-CM

## 2022-12-24 DIAGNOSIS — I447 Left bundle-branch block, unspecified: Secondary | ICD-10-CM

## 2022-12-24 DIAGNOSIS — R7309 Other abnormal glucose: Secondary | ICD-10-CM

## 2022-12-24 NOTE — Progress Notes (Signed)
ANNUAL WELLNESS VISIT AND FOLLOW UP   Assessment / Plan:   Diagnoses and all orders for this visit:  Annual Medicare Wellness Visit Due annually  Health maintenance reviewed   LBBB (left bundle branch block) Cardiology following Controlled  Hypothyroidism, unspecified type Controlled. Continue Levothyroxine. Reminded to take on an empty stomach 30-15mins before food.  Stop any Biotin Supplement 48-72 hours before next TSH level to reduce the risk of falsely low TSH levels. Continue to monitor.     Essential hypertension Discussed DASH (Dietary Approaches to Stop Hypertension) DASH diet is lower in sodium than a typical American diet. Cut back on foods that are high in saturated fat, cholesterol, and trans fats. Eat more whole-grain foods, fish, poultry, and nuts Remain active and exercise as tolerated daily.  Monitor BP at home-Call if greater than 130/80.  Check CMP/CBC  Hyperlipidemia, mixed Discussed lifestyle modifications. Recommended diet heavy in fruits and veggies, omega 3's. Decrease consumption of animal meats, cheeses, and dairy products. Remain active and exercise as tolerated. Continue to monitor. Check lipids/TSH  Vitamin D deficiency Continue supplementation to maintain goal of 70-100 Monitor levels  Fibromyalgia Lifestyle reviewed Currently managed  Allergic rhinitis due to pollen, unspecified seasonality Norel AD samples provided Continue medications as directed PRN Avoid triggers Continue to monitor  Obesity - BMI 31  Discussed appropriate BMI Diet modification. Physical activity. Encouraged/praised to build confidence.   Estrogen deficiency Continue to monitor  Abnormal glucose Education: Reviewed 'ABCs' of diabetes management  Discussed goals to be met and/or maintained include A1C (<7) Blood pressure (<130/80) Cholesterol (LDL <70) Continue Eye Exam yearly  Continue Dental Exam Q6 mo Discussed dietary  recommendations Discussed Physical Activity recommendations Check A1C  Chronic anxiety Reviewed relaxation techniques.  Sleep hygiene. Recommended Cognitive Behavioral Therapy (CBT). Recommended mindfulness meditation and exercise.   Insight-oriented psychotherapy given for 16 minutes exclusively. Psychoeducation:  encouraged personality growth wand development through coping techniques and problem-solving skills. Limit/Decrease/Monitor drug/alcohol intake.    Medication Management All medications discussed and reviewed in full. All questions and concerns regarding medications addressed.    Chronic left ear eczematous otitis externa Ear drops per ENT  Imbalance Improved  No orders of the defined types were placed in this encounter.  Future Appointments  Date Time Provider [place]  09/08/2023  2:00 PM Alycia Rossetti, NP [facility] None  12/24/2023 11:00 AM Darrol Jump, NP [facility] None     Plan:   During the course of the visit the patient was educated and counseled about appropriate screening and preventive services including:   Pneumococcal vaccine  Prevnar 13 Influenza vaccine Td vaccine Screening electrocardiogram Bone densitometry screening Colorectal cancer screening Diabetes screening Glaucoma screening Nutrition counseling  Advanced directives: requested   --------------------------------------------------------------------------------------------------  HPI  67 y.o. female  presents for AWV and follow up. 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  Overall she feels well  today.  She has no new concerns at this time.    Had jitteriness, dizziness and vision changes in 08/2021. Presented to ED per Dr. Melford Aase recommendation, had negative CT, declined to be transported for MRI. Has improved, normal vision per her ophth, but with persistent dizziness atypical for her previous vertigo was referred to [facility] who felt her  left sided vision changes and dizziness were consistent with a vestibulo ocular reflex issue, ordered vestibular testing and pending neuro rehab. She reports sx have improved.  Continues to have chronic nasal congestion with thick nasal drainage.    She has chronic scaling and itching in her left ear with a left cerumen impaction, ENT gives drops for atopic dermatitis and gets frequent irrigations.   She has chronic pain bilateral shoulders, bil foot pain, has seen rheumatoid in the past per patient, dx with fibromyalgia, doing well at this time.  Tylenol arthritis and voltaren helps.   BMI is Body mass index is 31.73 kg/m., she has been working on diet and exercise.  Wt Readings from Last 3 Encounters:  12/24/22 202 lb 9.6 oz (91.9 kg)  09/07/22 196 lb 12.8 oz (89.3 kg)  08/04/22 195 lb (88.5 kg)   She has LBBB, had exertional dyspnea in 2019 and saw cardiology Dr. Julianne Rice, ECHO 01/03/2018 showed LVEF 45-50% with hypokinesis/inferior/septal walls, mild LVH, grade 1 diastolic dysfunction, mild MR.   Her blood pressure has been controlled at home, today their BP is BP: 130/80.    She does not workout. She denies chest pain, shortness of breath, dizziness.    She is on cholesterol medication and taking zetia 10 mg daily. Her LDL cholesterol is at goal. The cholesterol last visit was:  Lab Results  Component Value Date   CHOL 184 09/07/2022   HDL 57 09/07/2022   LDLCALC 91 09/07/2022   LDLDIRECT 75.3 09/11/2010   TRIG 271 (H) 09/07/2022   CHOLHDL 3.2 09/07/2022  . Last A1C in the office was:  Lab Results  Component Value Date   HGBA1C 5.7  (H) 09/07/2022   Patient is on Vitamin D supplement, taking 5000 IU daily daily  Lab Results  Component Value Date   VD25OH 57 09/07/2022     She is on thyroid medication. Her medication was changed last visit: Levothyroxine 112 mcg whole tablet five days a week and half tablet two days a week.  Asymptomatic today.  Lab Results  Component Value Date   TSH 2.55 09/07/2022     Current Medications:   Current Outpatient Medications (Endocrine & Metabolic):    levothyroxine (SYNTHROID) 112 MCG tablet, TAKE 1 TABLET DAILY EXCEPT 1/2 TAB ON Sunday. TAKE ON AN EMPTY STOMACH WITH ONLY WATER FOR 30 MINUTES & NO ANTACID MEDS, CALCIUM OR MAGNESIUM FOR 4 HOURS & AVOID BIOTIN  Current Outpatient Medications (Cardiovascular):    bisoprolol-hydrochlorothiazide (ZIAC) 5-6.25 MG tablet, TAKE 1 TABLET DAILY FOR BLOOD PRESSURE.   ezetimibe (ZETIA) 10 MG tablet, Take 1 tablet Daily for Cholesterol to Prevent Heart Attacks, strokes  & Dementia  Current Outpatient Medications (Respiratory):    albuterol (VENTOLIN HFA) 108 (90 Base) MCG/ACT inhaler, Inhale 2 puffs into the lungs every 6 (six) hours as needed for wheezing or shortness of breath.   cetirizine (ZYRTEC) 10 MG tablet, Take 10 mg by mouth daily.   Fexofenadine HCl (ALLEGRA PO), Take by mouth. (Patient not taking: Reported on 12/24/2022)   Levocetirizine Dihydrochloride (XYZAL PO), Take 1 tablet by mouth daily. (Patient not taking: Reported on 12/24/2022)   Triamcinolone Acetonide (NASACORT AQ NA), Place 1 spray into the nose daily as needed. OTC (Patient not taking: Reported on 09/07/2022)    Current Outpatient Medications (Other):    cholecalciferol (VITAMIN D) 1000 units tablet, Take 5,000 Units by mouth daily.   esomeprazole (NEXIUM) 20 MG capsule, Take 20 mg by mouth daily at 12 noon.  meclizine (ANTIVERT) 25 MG tablet, Take 1 tablet (25 mg total) by mouth 3 (three) times daily as needed for dizziness.   Meclizine HCl (BONINE PO), Take by  mouth. PRN  Health Maintenance:   Immunization History  Administered Date(s) Administered   Influenza Split 07/10/2014, 08/06/2015   Influenza Whole 07/06/2007   Influenza, High Dose Seasonal PF 08/14/2021   Influenza,inj,quad, With Preservative 09/07/2016   Td 10/05/2001   Tdap 08/06/2015   Health Maintenance  Topic Date Due   COVID-19 Vaccine (1) Never done   Pneumonia Vaccine 62+ Years old (1 of 2 - PCV) Never done   Zoster Vaccines- Shingrix (1 of 2) Never done   Fecal DNA (Cologuard)  Never done   MAMMOGRAM  04/17/2019   DEXA SCAN  Never done   INFLUENZA VACCINE  05/05/2022   Medicare Annual Wellness (AWV)  12/24/2022   DTaP/Tdap/Td (3 - Td or Tdap) 08/05/2025   HPV VACCINES  Aged Out   Hepatitis C Screening  Discontinued    Pneumovax: declines to next OV Shingrix: declines for now Covid 19: 2/2, 2021, moderna, requested dates  Pap: 2008, declined further  MGM: 04/2017, ordered to schedule at breast center last CPE still hasn't scheduled, states will schedule once she is able, declines at this time DEXA: declines  Colonoscopy: couldn't tolerate prep, declines  Willing to do cologuard, ordered 01/04/20, 12/23/2020, 08/14/2021- states has at home, hasn't completed, will complete later this year per patient, declines today  Last Dental Exam: Dr. Vanessa Kick, last 12/2021, goes q42m Last Eye Exam:  [facility], last 2023  Patient Care Team: Unk Pinto, MD as PCP - General (Internal Medicine)  Medical History:  Past Medical History:  Diagnosis Date   GERD (gastroesophageal reflux disease)    HTN (hypertension)    Unspecified hypothyroidism    Unspecified vitamin D deficiency    Allergies Allergies  Allergen Reactions   Codeine     REACTION: hallucinations   Doxycycline Other (See Comments)    Double vision   Mometasone Furoate     REACTION: headache   Zetia [Ezetimibe] Diarrhea and Nausea Only    Prob her sx's were due to IBS & Coincidental      SURGICAL HISTORY She  has a past surgical history that includes No past surgeries. FAMILY HISTORY Her family history includes Arthritis in her mother; Breast cancer (age of onset: 77) in her sister; Depression in her mother and sister; Heart disease in her father; Hypertension in her mother; Kidney disease in her mother. SOCIAL HISTORY She  reports that she quit smoking about 41 years ago. Her smoking use included cigarettes. She started smoking about 51 years ago. She has a 5.00 pack-year smoking history. She has never used smokeless tobacco. She reports that she does not drink alcohol and does not use drugs.    MEDICARE WELLNESS OBJECTIVES: Physical activity:   Cardiac risk factors:   Depression/mood screen:      12/24/2022   11:56 AM  Depression screen PHQ 2/9  Decreased Interest 0  Down, Depressed, Hopeless 0  PHQ - 2 Score 0    ADLs:     12/24/2022   11:35 AM  In your present state of health, do you have any difficulty performing the following activities:  Hearing? 0  Vision? 0  Difficulty concentrating or making decisions? 0  Walking or climbing stairs? 0  Dressing or bathing? 0  Doing errands, shopping? 0  Preparing Food and eating ? N  Using the Toilet?  N  In the past six months, have you accidently leaked urine? N  Do you have problems with loss of bowel control? N  Managing your Medications? N  Managing your Finances? N  Housekeeping or managing your Housekeeping? N     Cognitive Testing  Alert? Yes  Normal Appearance?Yes  Oriented to person? Yes  Place? Yes   Time? Yes  Recall of three objects?  Yes  Can perform simple calculations? Yes  Displays appropriate judgment?Yes  Can read the correct time from a watch face?Yes  EOL planning: Does Patient Have a Medical Advance Directive?: Yes Type of Advance Directive: Living will     Review of Systems: Review of Systems  Constitutional:  Negative for malaise/fatigue and weight loss.  HENT:  Positive  for congestion (chronic following covid 19). Negative for ear discharge, hearing loss, nosebleeds, sinus pain, sore throat and tinnitus.   Eyes:  Negative for blurred vision and double vision.       Change in peripheral left vision, vesibular per ENT workup, pending rehab  Respiratory:  Negative for cough (post nasal drip, throat clearing), sputum production, shortness of breath and wheezing.   Cardiovascular:  Negative for chest pain, palpitations, orthopnea, claudication, leg swelling and PND.  Gastrointestinal:  Negative for abdominal pain, blood in stool, constipation, diarrhea, heartburn, melena, nausea and vomiting.  Genitourinary: Negative.   Musculoskeletal:  Negative for falls, joint pain and myalgias.  Skin:  Negative for rash.  Neurological:  Negative for dizziness (improved,), tingling, sensory change, weakness and headaches.  Endo/Heme/Allergies:  Positive for environmental allergies. Negative for polydipsia.  Psychiatric/Behavioral: Negative.  Negative for depression, memory loss, substance abuse and suicidal ideas. The patient is not nervous/anxious and does not have insomnia.   All other systems reviewed and are negative.   Physical Exam: Estimated body mass index is 31.73 kg/m as calculated from the following:   Height as of this encounter: 5\' 7"  (1.702 m).   Weight as of this encounter: 202 lb 9.6 oz (91.9 kg). BP 130/80   Pulse (!) 58   Temp 97.7 F (36.5 C)   Ht 5\' 7"  (1.702 m)   Wt 202 lb 9.6 oz (91.9 kg)   LMP  (LMP Unknown)   SpO2 98%   BMI 31.73 kg/m   General Appearance: Well nourished well developed, in no apparent distress.  Eyes: PERRLA, EOMs, conjunctiva no swelling or erythema ENT/Mouth: Left Ear canal obstructed by dry wax, Right canal clear.  R TM normal bilaterally with no erythema, bulging, retraction, or loss of landmark.  Oropharynx moist and clear with no exudate, erythema, or swelling.   Neck: Supple, thyroid normal. No bruits.  No cervical  adenopathy Respiratory: Respiratory effort normal, Breath sounds clear A&P without wheeze, rhonchi, rales.   Cardio: RRR without murmurs, rubs or gallops. Brisk peripheral pulses without edema.  Chest: symmetric, with normal excursions Abdomen: Soft, nontender, no guarding, rebound, hernias, masses, or organomegaly.  Lymphatics: Non tender without lymphadenopathy.  Genitourinary: Patient declined pap smear and pelvic exam  Musculoskeletal: Full ROM all peripheral extremities,5/5 strength, and normal gait.  Skin: Warm, dry without rashes, lesions, ecchymosis. Neuro: Awake and oriented X 3, Cranial nerves intact, reflexes equal bilaterally. Normal muscle tone, no cerebellar symptoms. Sensation intact.  Psych:  normal affect, Insight and Judgment appropriate.  Breasts: Patient declined manual exam, mammogram ordered to schedule   Medicare Attestation I have personally reviewed: The patient's medical and social history Their use of alcohol, tobacco or illicit drugs Their current  medications and supplements The patient's functional ability including ADLs,fall risks, home safety risks, cognitive, and hearing and visual impairment Diet and physical activities Evidence for depression or mood disorders  The patient's weight, height, BMI, and visual acuity have been recorded in the chart.  I have made referrals, counseling, and provided education to the patient based on review of the above and I have provided the patient with a written personalized care plan for preventive services.     Darrol Jump, NP 12:26 PM [facility]

## 2023-01-06 DIAGNOSIS — M9903 Segmental and somatic dysfunction of lumbar region: Secondary | ICD-10-CM | POA: Diagnosis not present

## 2023-01-06 DIAGNOSIS — M5136 Other intervertebral disc degeneration, lumbar region: Secondary | ICD-10-CM | POA: Diagnosis not present

## 2023-01-06 DIAGNOSIS — M9905 Segmental and somatic dysfunction of pelvic region: Secondary | ICD-10-CM | POA: Diagnosis not present

## 2023-01-06 DIAGNOSIS — M9904 Segmental and somatic dysfunction of sacral region: Secondary | ICD-10-CM | POA: Diagnosis not present

## 2023-01-27 DIAGNOSIS — M9905 Segmental and somatic dysfunction of pelvic region: Secondary | ICD-10-CM | POA: Diagnosis not present

## 2023-01-27 DIAGNOSIS — M9904 Segmental and somatic dysfunction of sacral region: Secondary | ICD-10-CM | POA: Diagnosis not present

## 2023-01-27 DIAGNOSIS — M5136 Other intervertebral disc degeneration, lumbar region: Secondary | ICD-10-CM | POA: Diagnosis not present

## 2023-01-27 DIAGNOSIS — M9903 Segmental and somatic dysfunction of lumbar region: Secondary | ICD-10-CM | POA: Diagnosis not present

## 2023-02-03 DIAGNOSIS — M5136 Other intervertebral disc degeneration, lumbar region: Secondary | ICD-10-CM | POA: Diagnosis not present

## 2023-02-03 DIAGNOSIS — M9904 Segmental and somatic dysfunction of sacral region: Secondary | ICD-10-CM | POA: Diagnosis not present

## 2023-02-03 DIAGNOSIS — M9905 Segmental and somatic dysfunction of pelvic region: Secondary | ICD-10-CM | POA: Diagnosis not present

## 2023-02-03 DIAGNOSIS — M9903 Segmental and somatic dysfunction of lumbar region: Secondary | ICD-10-CM | POA: Diagnosis not present

## 2023-02-10 DIAGNOSIS — M9903 Segmental and somatic dysfunction of lumbar region: Secondary | ICD-10-CM | POA: Diagnosis not present

## 2023-02-10 DIAGNOSIS — M9905 Segmental and somatic dysfunction of pelvic region: Secondary | ICD-10-CM | POA: Diagnosis not present

## 2023-02-10 DIAGNOSIS — M9904 Segmental and somatic dysfunction of sacral region: Secondary | ICD-10-CM | POA: Diagnosis not present

## 2023-02-10 DIAGNOSIS — M5136 Other intervertebral disc degeneration, lumbar region: Secondary | ICD-10-CM | POA: Diagnosis not present

## 2023-03-04 ENCOUNTER — Other Ambulatory Visit: Payer: Self-pay | Admitting: Nurse Practitioner

## 2023-03-05 ENCOUNTER — Other Ambulatory Visit: Payer: Self-pay | Admitting: Internal Medicine

## 2023-03-31 DIAGNOSIS — M9904 Segmental and somatic dysfunction of sacral region: Secondary | ICD-10-CM | POA: Diagnosis not present

## 2023-03-31 DIAGNOSIS — M9903 Segmental and somatic dysfunction of lumbar region: Secondary | ICD-10-CM | POA: Diagnosis not present

## 2023-03-31 DIAGNOSIS — M5136 Other intervertebral disc degeneration, lumbar region: Secondary | ICD-10-CM | POA: Diagnosis not present

## 2023-03-31 DIAGNOSIS — M9905 Segmental and somatic dysfunction of pelvic region: Secondary | ICD-10-CM | POA: Diagnosis not present

## 2023-04-05 IMAGING — CT CT HEAD W/O CM
4 series · 17 of 47 positions shown, 19 images · non-contrast
Comparison: None.

CLINICAL DATA: Headache and visual disturbance for 3 days.

EXAM:
CT HEAD WITHOUT CONTRAST
TECHNIQUE: Contiguous axial images were obtained from the base of the skull
through the vertex without intravenous contrast.

[Series 2: head wo · axial · 0.41mm/px · z∈[+936,+1056]mm · 7 of 32 slices shown, 9 images]
[im 4/32  brain]
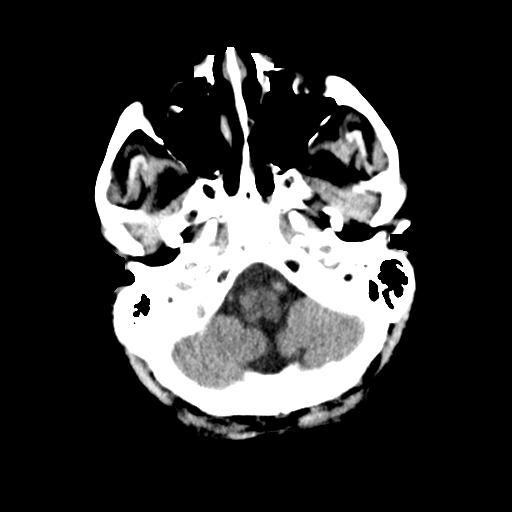
[im 4/32  bone]
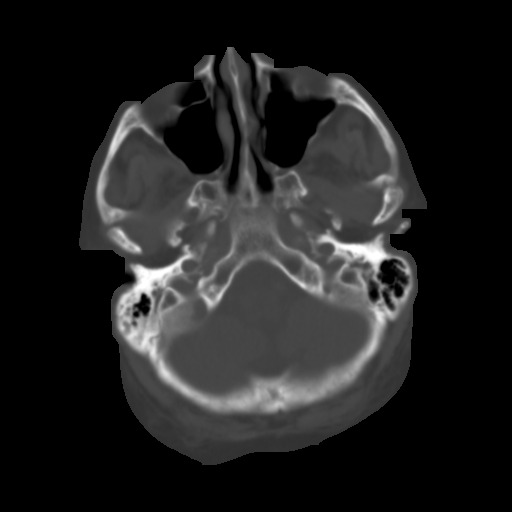
[im 8/32  brain]
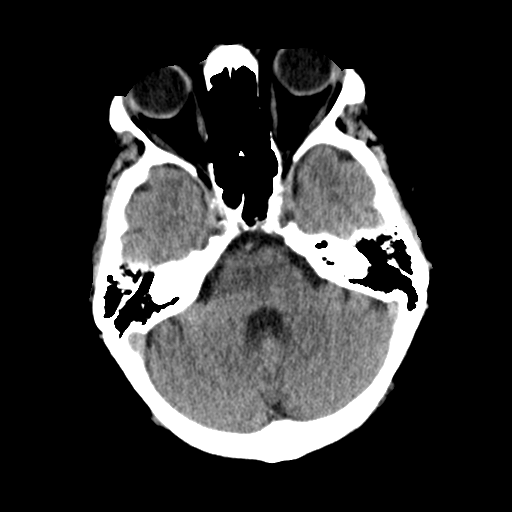
[im 12/32  brain]
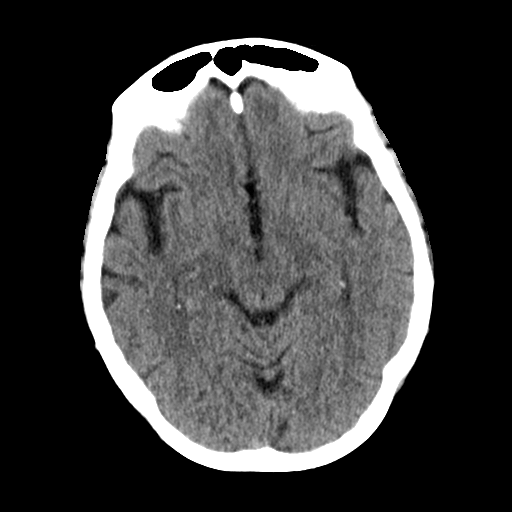
[im 16/32  brain]
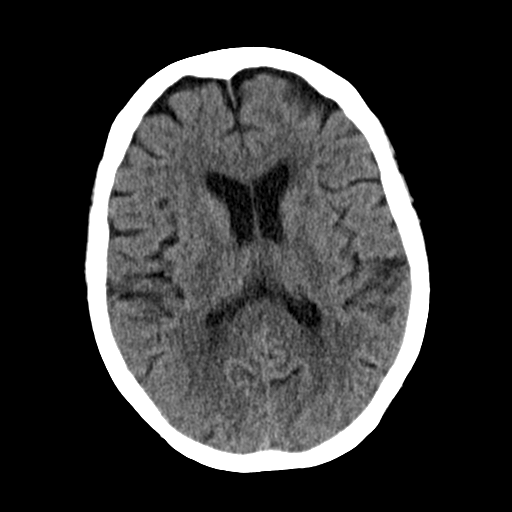
[im 20/32  brain]
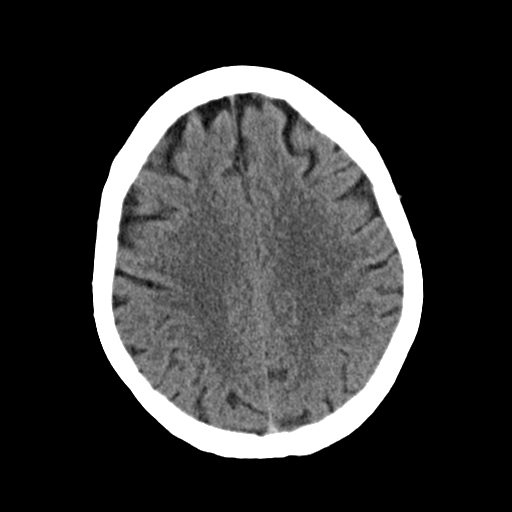
[im 20/32  bone]
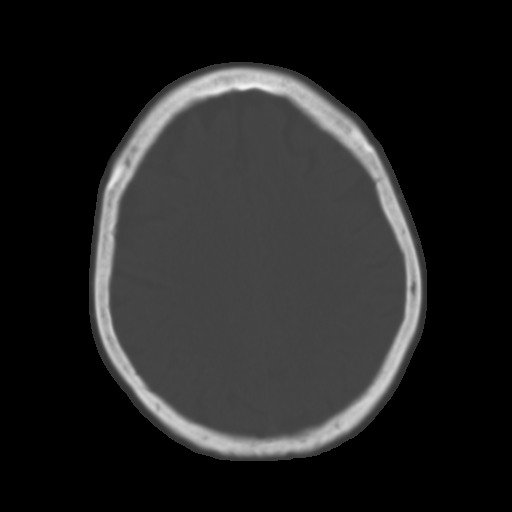
[im 24/32  brain]
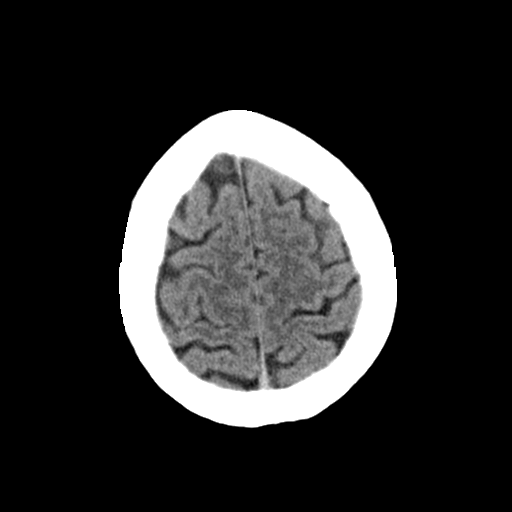
[im 28/32  brain]
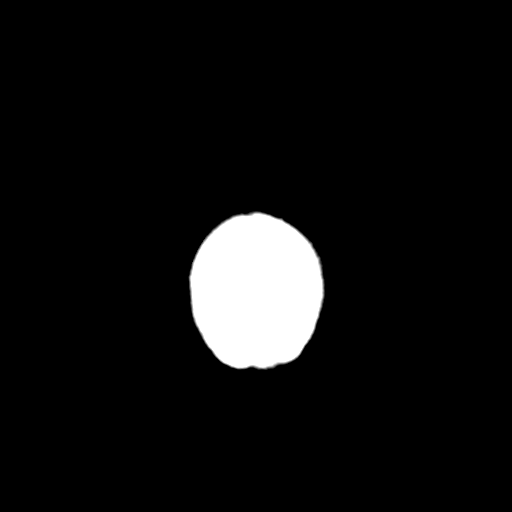

[Series 3: head bone · axial · 0.41mm/px · z∈[+935,+991]mm · 4 of 79 slices shown]
[im 8/79  bone]
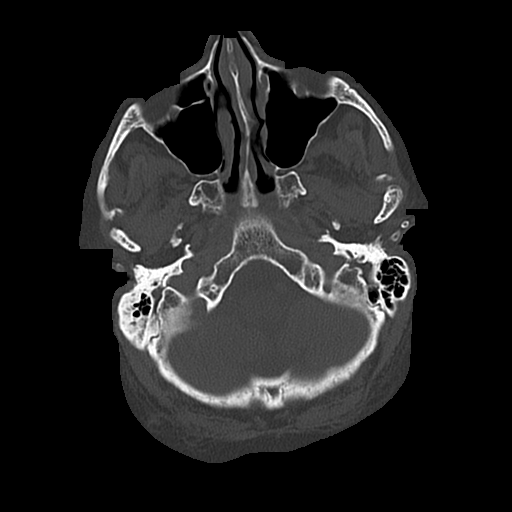
[im 16/79  bone]
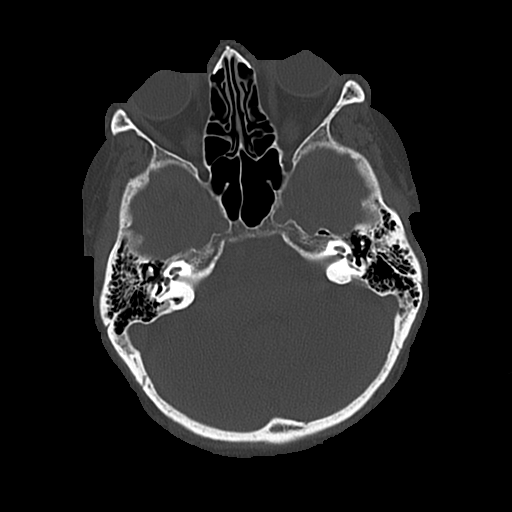
[im 24/79  bone]
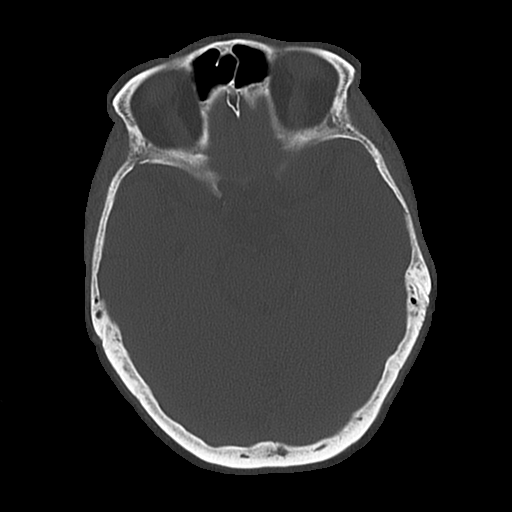
[im 36/79  bone]
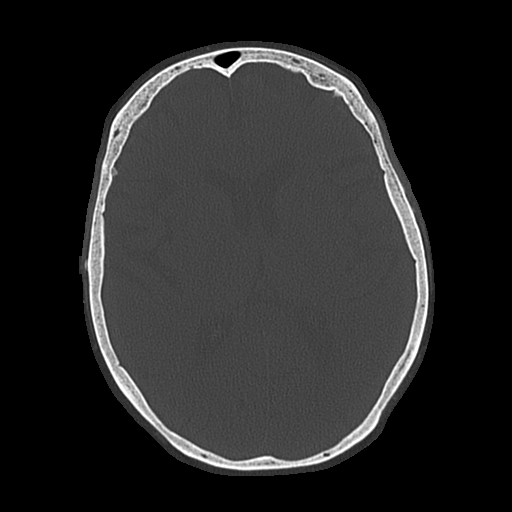

[Series 4: coronal soft · coronal · 0.31mm/px · 3 of 66 slices shown]
[im 22/66  brain]
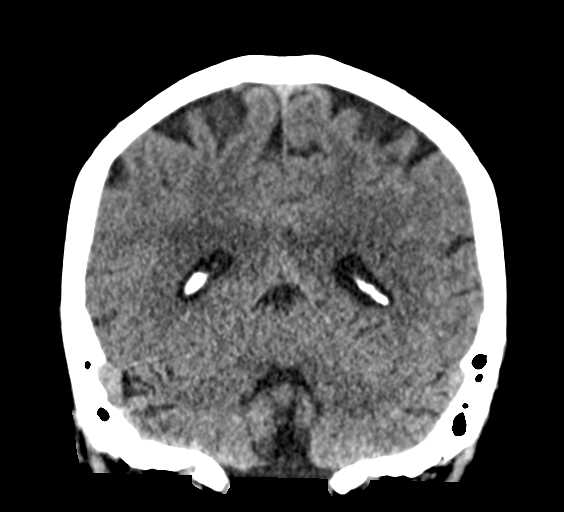
[im 29/66  brain]
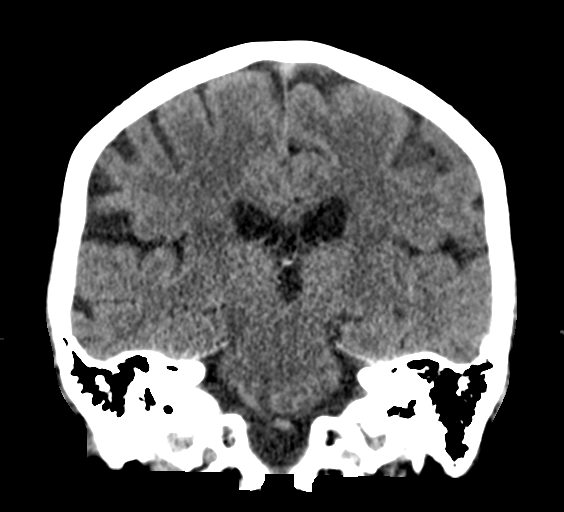
[im 37/66  brain]
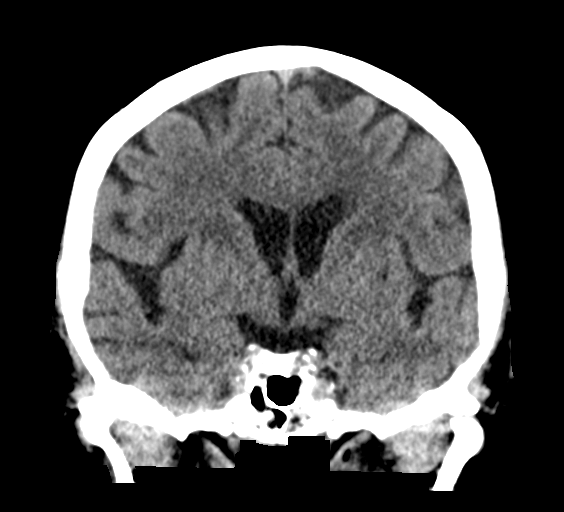

[Series 5: sagittal soft · sagittal · 0.31mm/px · 3 of 59 slices shown]
[im 20/59  brain]
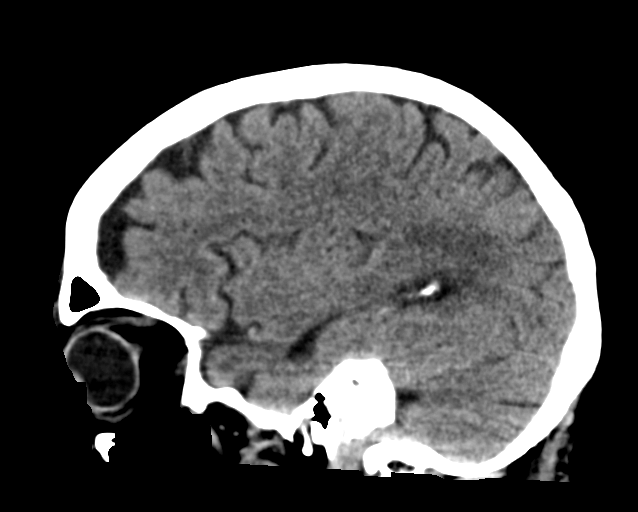
[im 30/59  brain]
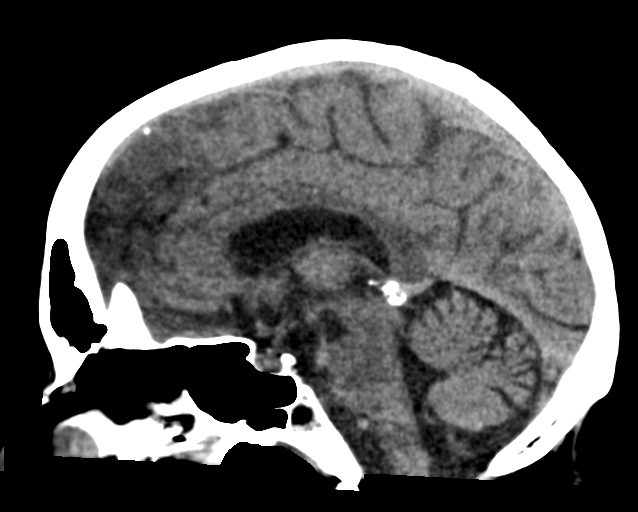
[im 39/59  brain]
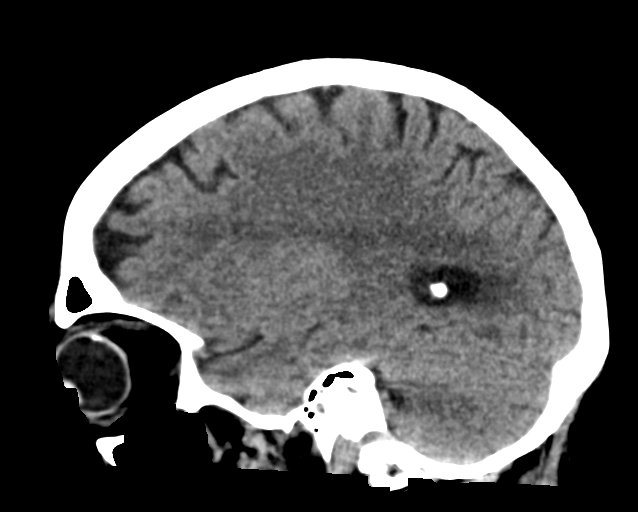

[17 of 47 positions shown; findings below may reference images not displayed]

FINDINGS: Brain: No evidence of acute infarction, hemorrhage, hydrocephalus,
extra-axial collection, or mass lesion/mass effect. Mild diffuse
cerebral atrophy noted.

Vascular:  No hyperdense vessel or other acute findings.

Skull: No evidence of fracture or other significant bone
abnormality.

Sinuses/Orbits:  No acute findings.

Other: None.
IMPRESSION: No acute intracranial abnormality.

Mild cerebral atrophy.

## 2023-04-06 DIAGNOSIS — M5136 Other intervertebral disc degeneration, lumbar region: Secondary | ICD-10-CM | POA: Diagnosis not present

## 2023-04-06 DIAGNOSIS — M9905 Segmental and somatic dysfunction of pelvic region: Secondary | ICD-10-CM | POA: Diagnosis not present

## 2023-04-06 DIAGNOSIS — M9903 Segmental and somatic dysfunction of lumbar region: Secondary | ICD-10-CM | POA: Diagnosis not present

## 2023-04-06 DIAGNOSIS — M9904 Segmental and somatic dysfunction of sacral region: Secondary | ICD-10-CM | POA: Diagnosis not present

## 2023-04-13 DIAGNOSIS — M9905 Segmental and somatic dysfunction of pelvic region: Secondary | ICD-10-CM | POA: Diagnosis not present

## 2023-04-13 DIAGNOSIS — M9904 Segmental and somatic dysfunction of sacral region: Secondary | ICD-10-CM | POA: Diagnosis not present

## 2023-04-13 DIAGNOSIS — M5136 Other intervertebral disc degeneration, lumbar region: Secondary | ICD-10-CM | POA: Diagnosis not present

## 2023-04-13 DIAGNOSIS — M9903 Segmental and somatic dysfunction of lumbar region: Secondary | ICD-10-CM | POA: Diagnosis not present

## 2023-04-29 DIAGNOSIS — M9904 Segmental and somatic dysfunction of sacral region: Secondary | ICD-10-CM | POA: Diagnosis not present

## 2023-04-29 DIAGNOSIS — M9905 Segmental and somatic dysfunction of pelvic region: Secondary | ICD-10-CM | POA: Diagnosis not present

## 2023-04-29 DIAGNOSIS — M5136 Other intervertebral disc degeneration, lumbar region: Secondary | ICD-10-CM | POA: Diagnosis not present

## 2023-04-29 DIAGNOSIS — M9903 Segmental and somatic dysfunction of lumbar region: Secondary | ICD-10-CM | POA: Diagnosis not present

## 2023-05-06 DIAGNOSIS — M5136 Other intervertebral disc degeneration, lumbar region: Secondary | ICD-10-CM | POA: Diagnosis not present

## 2023-05-06 DIAGNOSIS — M9904 Segmental and somatic dysfunction of sacral region: Secondary | ICD-10-CM | POA: Diagnosis not present

## 2023-05-06 DIAGNOSIS — M9903 Segmental and somatic dysfunction of lumbar region: Secondary | ICD-10-CM | POA: Diagnosis not present

## 2023-05-06 DIAGNOSIS — M9905 Segmental and somatic dysfunction of pelvic region: Secondary | ICD-10-CM | POA: Diagnosis not present

## 2023-05-13 DIAGNOSIS — M9904 Segmental and somatic dysfunction of sacral region: Secondary | ICD-10-CM | POA: Diagnosis not present

## 2023-05-13 DIAGNOSIS — M9905 Segmental and somatic dysfunction of pelvic region: Secondary | ICD-10-CM | POA: Diagnosis not present

## 2023-05-13 DIAGNOSIS — M5136 Other intervertebral disc degeneration, lumbar region: Secondary | ICD-10-CM | POA: Diagnosis not present

## 2023-05-13 DIAGNOSIS — M9903 Segmental and somatic dysfunction of lumbar region: Secondary | ICD-10-CM | POA: Diagnosis not present

## 2023-05-26 DIAGNOSIS — M9903 Segmental and somatic dysfunction of lumbar region: Secondary | ICD-10-CM | POA: Diagnosis not present

## 2023-05-26 DIAGNOSIS — M5136 Other intervertebral disc degeneration, lumbar region: Secondary | ICD-10-CM | POA: Diagnosis not present

## 2023-05-26 DIAGNOSIS — M9904 Segmental and somatic dysfunction of sacral region: Secondary | ICD-10-CM | POA: Diagnosis not present

## 2023-05-26 DIAGNOSIS — M9905 Segmental and somatic dysfunction of pelvic region: Secondary | ICD-10-CM | POA: Diagnosis not present

## 2023-06-08 DIAGNOSIS — M9905 Segmental and somatic dysfunction of pelvic region: Secondary | ICD-10-CM | POA: Diagnosis not present

## 2023-06-08 DIAGNOSIS — M9904 Segmental and somatic dysfunction of sacral region: Secondary | ICD-10-CM | POA: Diagnosis not present

## 2023-06-08 DIAGNOSIS — M9903 Segmental and somatic dysfunction of lumbar region: Secondary | ICD-10-CM | POA: Diagnosis not present

## 2023-06-08 DIAGNOSIS — M5136 Other intervertebral disc degeneration, lumbar region: Secondary | ICD-10-CM | POA: Diagnosis not present

## 2023-07-15 DIAGNOSIS — M9905 Segmental and somatic dysfunction of pelvic region: Secondary | ICD-10-CM | POA: Diagnosis not present

## 2023-07-15 DIAGNOSIS — M5136 Other intervertebral disc degeneration, lumbar region with discogenic back pain only: Secondary | ICD-10-CM | POA: Diagnosis not present

## 2023-07-15 DIAGNOSIS — M9903 Segmental and somatic dysfunction of lumbar region: Secondary | ICD-10-CM | POA: Diagnosis not present

## 2023-07-15 DIAGNOSIS — M9904 Segmental and somatic dysfunction of sacral region: Secondary | ICD-10-CM | POA: Diagnosis not present

## 2023-09-07 NOTE — Progress Notes (Unsigned)
CPE  Assessment / Plan:    Denise Jensen was seen today for annual exam.  Diagnoses and all orders for this visit:  Encounter for Annual Physical Exam with abnormal findings Due annually  Health Maintenance reviewed Healthy lifestyle reviewed and goals set Strongly encouraged to schedule mammogram  LBBB (left bundle branch block) Denies any concerning sx; est with cardiology if needed  Monitor closely, reduce BB if needed - EKG  Hypothyroidism, unspecified type Taking levothyroxine 112 mcg whote tablet 5 days a week and half tablet 2 days a week. Reminder to take on an empty stomach 30-62mins before first meal of the day. No antacid medications for 4 hours. -     TSH  Essential hypertension Continue current medications: Ziac 5/6.25 mg daily Monitor blood pressure at home; call if consistently over 130/80 Continue DASH diet.   Reminder to go to the ER if any CP, SOB, nausea, dizziness, severe HA, changes vision/speech, left arm numbness and tingling and jaw pain.  Hyperlipidemia, mixed Zetia 10mg .  Discussed dietary and exercise modifications Low fat diet -     Lipid panel  Vitamin D deficiency Continue supplementation to maintain goal of 70-100 Taking  5000 IU daily - verify -     VITAMIN D 25 Hydroxy (Vit-D Deficiency, Fractures)  Abnormal Glucose Continue diet and exercise - A1c  Fibromyalgia Lifestyle reivewed, doing fairly   Allergic rhinitis due to pollen, unspecified seasonality Switch from Allegra to Zyrtec, begin nasacort daily Follow up in office if not resolving.   Obesity - BMI 30 Long discussion about weight loss, diet, and exercise Recommended diet heavy in fruits and veggies and low in animal meats, cheeses, and dairy products, appropriate calorie intake Patient will work on adding resistance exercises to build muscle mass, continue portion control, avoid processed carbs Discussed appropriate weight for height  Follow up at next visit  Estrogen  deficiency -     DG Bone Density; Future - patient declines  Abnormal glucose Continue diet and exercise -     Hemoglobin A1c  Chronic anxiety Managing without medication Stress management techniques discussed, increase water, good sleep hygiene discussed, increase exercise, and increase veggies.    Medication management -     CBC with Differential/Platelet -     COMPLETE METABOLIC PANEL WITH GFR -     Magnesium -     Lipid panel -     TSH -     Hemoglobin A1c -     VITAMIN D 25 Hydroxy (Vit-D Deficiency, Fractures) -     EKG 12-Lead -     [place], RETROPERITNL ABD,  LTD -     Urinalysis w microscopic + reflex cultur -     Microalbumin / creatinine urine ratio   Screening for hematuria or proteinuria -     Urinalysis w microscopic + reflex cultur -     Microalbumin / creatinine urine ratio  Screening for ischemic heart disease -     EKG 12-Lead  Screening for AAA (abdominal aortic aneurysm) -     [place], RETROPERITNL ABD,  LTD           Further disposition pending results if labs check today. Discussed med's effects and SE's.   Over 40 minutes of face to face interview, exam, counseling, chart review, and critical decision making was performed.   Future Appointments  Date Time Provider Department Center  09/08/2023  2:00 PM Raynelle Dick, NP [facility] None  12/24/2023 11:00 AM Adela Glimpse, NP [facility]  None  09/07/2024  2:00 PM Raynelle Dick, NP [facility] None      --------------------------------------------------------------------------------------------------  HPI  67 y.o. female  presents for CPE. 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  Married, 1 child, 2 grandchildren in [place]. She had to take care of her husband who was dealing with cancer and 3 friends with Alzheimers. She has started to learn to say no to be able to get some time for herself. Her husband is to have hip surgery in January. 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  Her granddaughter was 73 and got covid and was in ICU and had to be resuscitated 2 years ago, now 46 but is now doing well    She continues to cough up clear to yellow thick mucus. Continues to use nebulizer. Has not been using nasal steroid and has been on same antihistamine for many months.   BMI is There is no height or weight on file to calculate BMI., she has been working on diet, will do smaller servings. Admits hasn't been exercising but thinks she needs to restart walking to help with weight. Is active in home and garden.  Wt Readings from Last 3 Encounters:  12/24/22 202 lb 9.6 oz (91.9 kg)  09/07/22 196 lb 12.8 oz (89.3 kg)  08/04/22 195 lb (88.5 kg)   She has LBBB, had exertional dyspnea in 2019 and saw cardiology Dr. Henrietta Hoover, ECHO 01/03/2018 showed LVEF 45-50% with hypokinesis/inferior/septal walls, mild LVH, grade 1 diastolic dysfunction, mild MR.   Her blood pressure has been controlled at home, currently on Ziac 5/6.25 mg QD, today their BP is  .  BP Readings from Last 3 Encounters:  12/24/22 130/80  09/07/22 128/84  08/04/22 128/80  She does not workout. She denies chest pain, shortness of breath, dizziness.     She is on cholesterol medication and taking zetia 10 mg daily. Her LDL cholesterol is at goal. The cholesterol last visit was:  Lab Results  Component Value Date   CHOL 184 09/07/2022   HDL 57 09/07/2022   LDLCALC 91 09/07/2022   LDLDIRECT 75.3 09/11/2010   TRIG 271 (H) 09/07/2022   CHOLHDL 3.2 09/07/2022  . Last A1C in the office was:  Lab Results  Component Value Date   HGBA1C 5.7  (H) 09/07/2022   Patient is on Vitamin D supplement, she reduced by half since 2021, ? 5000 IU Lab Results  Component Value Date   VD25OH 74 09/07/2022     She is on thyroid medication. Her medication was not changed last visit: Levothyroxine 112 mcg whole tablet six days a week and half tablet on Sunday. Asymptomatic today.  Lab Results  Component Value Date   TSH 2.55 09/07/2022     Current Medications:   Current Outpatient Medications (Endocrine & Metabolic):    levothyroxine (SYNTHROID) 112 MCG tablet, TAKE 1 TABLET DAILY EXCEPT 1/2 TAB ON Sunday. TAKE ON AN EMPTY STOMACH WITH ONLY WATER FOR 30 MINUTES & NO ANTACID MEDS, CALCIUM OR MAGNESIUM FOR 4 HOURS & AVOID BIOTIN  Current Outpatient Medications (Cardiovascular):    bisoprolol-hydrochlorothiazide (ZIAC) 5-6.25 MG tablet, TAKE 1 TABLET DAILY FOR BLOOD PRESSURE.   ezetimibe (ZETIA) 10 MG tablet, Take 1 tablet Daily for Cholesterol  to Prevent Heart Attacks, strokes  & Dementia   rosuvastatin (CRESTOR) 5 MG tablet, TAKE 1 TABLET BY MOUTH DAILY FOR CHOLESTEROL  Current Outpatient Medications (Respiratory):    albuterol (VENTOLIN HFA) 108 (90 Base) MCG/ACT inhaler, Inhale 2 puffs into the lungs every 6 (six) hours as needed for wheezing or shortness of breath.   cetirizine (ZYRTEC) 10 MG tablet, Take 10 mg by mouth daily.   Fexofenadine HCl (ALLEGRA PO), Take by mouth. (Patient not taking: Reported on 12/24/2022)   Levocetirizine Dihydrochloride (XYZAL PO), Take 1 tablet by mouth daily. (Patient not taking: Reported on 12/24/2022)   Triamcinolone Acetonide (NASACORT AQ NA), Place 1 spray into the nose daily as needed. OTC (Patient not taking: Reported on 09/07/2022)    Current Outpatient Medications (Other):    cholecalciferol (VITAMIN D) 1000 units tablet, Take 5,000 Units by mouth daily.   esomeprazole (NEXIUM) 20 MG capsule, Take 20 mg by mouth daily at 12 noon.   meclizine (ANTIVERT) 25 MG tablet, Take 1 tablet (25 mg total) by  mouth 3 (three) times daily as needed for dizziness.  Health Maintenance:   Immunization History  Administered Date(s) Administered   Influenza Split 07/10/2014, 08/06/2015   Influenza Whole 07/06/2007   Influenza, High Dose Seasonal PF 08/14/2021   Influenza,inj,quad, With Preservative 09/07/2016   Td 10/05/2001   Tdap 08/06/2015   Tetanus: 2016 Pneumovax: declines to next OV Flu vaccine: 2020, TODAY  Shingrix: check with insurance  Covid 19: 2/2, 2021, moderna, requested date  Pap: 2008, declined further  MGM: 04/2017, ordered to schedule at  DEXA: declines  Colonoscopy: couldn't tolerate prep, declines  Willing to do cologuard, ordered 01/04/20, 12/23/2020 - hasn't completed   Last Dental Exam:  2017 Last Eye Exam:   Opthalmology  Patient Care Team: Lucky Cowboy, MD as PCP - General (Internal Medicine)  Medical History:  Past Medical History:  Diagnosis Date   GERD (gastroesophageal reflux disease)    HTN (hypertension)    Unspecified hypothyroidism    Unspecified vitamin D deficiency    Allergies Allergies  Allergen Reactions   Codeine     REACTION: hallucinations   Doxycycline Other (See Comments)    Double vision   Mometasone Furoate     REACTION: headache   Zetia [Ezetimibe] Diarrhea and Nausea Only    Prob her sx's were due to IBS & Coincidental     SURGICAL HISTORY She  has a past surgical history that includes No past surgeries. FAMILY HISTORY Her family history includes Arthritis in her mother; Breast cancer (age of onset: 67) in her sister; Depression in her mother and sister; Heart disease in her father; Hypertension in her mother; Kidney disease in her mother. SOCIAL HISTORY She  reports that she quit smoking about 41 years ago. Her smoking use included cigarettes. She started smoking about 51 years ago. She has a 5 pack-year smoking history. She has never used smokeless tobacco. She reports that she does not drink alcohol and does  not use drugs.  Smoked 10-15 years  Review of Systems: Review of Systems  Constitutional:  Negative for malaise/fatigue and weight loss.  HENT:  Positive for congestion. Negative for ear discharge, hearing loss, nosebleeds, sinus pain, sore throat and tinnitus.   Eyes:  Negative for blurred vision and double vision.  Respiratory:  Positive for cough (post nasal drip). Negative for sputum production, shortness of breath and wheezing.   Cardiovascular:  Negative for chest pain, palpitations, orthopnea, claudication, leg swelling and PND.  Gastrointestinal:  Negative for abdominal pain, blood in stool, constipation, diarrhea, heartburn, melena, nausea and vomiting.  Genitourinary: Negative.   Musculoskeletal:  Negative for falls, joint pain and myalgias.  Skin:  Negative for rash.  Neurological:  Negative for dizziness, tingling, sensory change, weakness and headaches.  Endo/Heme/Allergies:  Positive for environmental allergies. Negative for polydipsia.  Psychiatric/Behavioral: Negative.  Negative for depression, memory loss, substance abuse and suicidal ideas. The patient is not nervous/anxious and does not have insomnia.   All other systems reviewed and are negative.   Physical Exam: Estimated body mass index is 31.73 kg/m as calculated from the following:   Height as of 12/24/22: 5\' 7"  (1.702 m).   Weight as of 12/24/22: 202 lb 9.6 oz (91.9 kg). LMP  (LMP Unknown)   General Appearance: Well nourished well developed, in no apparent distress.  Eyes: PERRLA, EOMs, conjunctiva no swelling or erythema ENT/Mouth: Left Ear canal obstructed by dry wax, Right canal clear.  R TM normal bilaterally with no erythema, bulging, retraction, or loss of landmark.  Oropharynx moist and clear with no exudate, erythema, or swelling.   Neck: Supple, thyroid normal. No bruits.  No cervical adenopathy Respiratory: Respiratory effort normal, Breath sounds clear A&P without wheeze, rhonchi, rales.   Cardio: RRR  without murmurs, rubs or gallops. Brisk peripheral pulses without edema.  Chest: symmetric, with normal excursions Abdomen: Soft, nontender, no guarding, rebound, hernias, masses, or organomegaly.  Lymphatics: Non tender without lymphadenopathy.  Genitourinary: Patient declined pap smear and pelvic exam  Musculoskeletal: Full ROM all peripheral extremities,5/5 strength, and normal gait.  Skin: Warm, dry without rashes, lesions, ecchymosis. Neuro: Awake and oriented X 3, Cranial nerves intact, reflexes equal bilaterally. Normal muscle tone, no cerebellar symptoms. Sensation intact.  Psych:  normal affect, Insight and Judgment appropriate.  Breasts: Patient declined manual exam, mammogram strongly encouraged to schedule  EKG: sinus bradycardia, LBBB, NSCPT AAA: < 3 cm  Quamere Mussell Hollie Salk, NP 12:27 PM [facility]

## 2023-09-08 ENCOUNTER — Ambulatory Visit (INDEPENDENT_AMBULATORY_CARE_PROVIDER_SITE_OTHER): Payer: Medicare HMO | Admitting: Nurse Practitioner

## 2023-09-08 ENCOUNTER — Encounter: Payer: Self-pay | Admitting: Nurse Practitioner

## 2023-09-08 VITALS — BP 134/82 | HR 66 | Temp 97.7°F | Ht 66.5 in | Wt 202.4 lb

## 2023-09-08 DIAGNOSIS — I447 Left bundle-branch block, unspecified: Secondary | ICD-10-CM | POA: Diagnosis not present

## 2023-09-08 DIAGNOSIS — R7309 Other abnormal glucose: Secondary | ICD-10-CM

## 2023-09-08 DIAGNOSIS — F419 Anxiety disorder, unspecified: Secondary | ICD-10-CM

## 2023-09-08 DIAGNOSIS — E039 Hypothyroidism, unspecified: Secondary | ICD-10-CM

## 2023-09-08 DIAGNOSIS — M79622 Pain in left upper arm: Secondary | ICD-10-CM

## 2023-09-08 DIAGNOSIS — Z23 Encounter for immunization: Secondary | ICD-10-CM

## 2023-09-08 DIAGNOSIS — E782 Mixed hyperlipidemia: Secondary | ICD-10-CM

## 2023-09-08 DIAGNOSIS — J301 Allergic rhinitis due to pollen: Secondary | ICD-10-CM

## 2023-09-08 DIAGNOSIS — Z136 Encounter for screening for cardiovascular disorders: Secondary | ICD-10-CM | POA: Diagnosis not present

## 2023-09-08 DIAGNOSIS — M797 Fibromyalgia: Secondary | ICD-10-CM

## 2023-09-08 DIAGNOSIS — I700 Atherosclerosis of aorta: Secondary | ICD-10-CM

## 2023-09-08 DIAGNOSIS — I10 Essential (primary) hypertension: Secondary | ICD-10-CM

## 2023-09-08 DIAGNOSIS — Z79899 Other long term (current) drug therapy: Secondary | ICD-10-CM

## 2023-09-08 DIAGNOSIS — Z1389 Encounter for screening for other disorder: Secondary | ICD-10-CM

## 2023-09-08 DIAGNOSIS — Z0000 Encounter for general adult medical examination without abnormal findings: Secondary | ICD-10-CM | POA: Diagnosis not present

## 2023-09-08 DIAGNOSIS — E559 Vitamin D deficiency, unspecified: Secondary | ICD-10-CM

## 2023-09-08 DIAGNOSIS — Z0001 Encounter for general adult medical examination with abnormal findings: Secondary | ICD-10-CM

## 2023-09-08 DIAGNOSIS — E66811 Obesity, class 1: Secondary | ICD-10-CM

## 2023-09-08 DIAGNOSIS — E6609 Other obesity due to excess calories: Secondary | ICD-10-CM

## 2023-09-08 DIAGNOSIS — E2839 Other primary ovarian failure: Secondary | ICD-10-CM

## 2023-09-08 NOTE — Patient Instructions (Signed)
Biceps Tendon Subluxation  Biceps tendon subluxation is an injury to the shoulder area. The biceps muscle is located on the front side of the upper arm. When this muscle contracts, it causes the forearm to bend at the elbow joint. Biceps muscle contraction also assists in raising the arm at the shoulder joint. A tendon is a strong cord of tissue that attaches muscle to bone. Biceps muscles have three tendons. One tendon attaches at the elbow (distal). The other two tendons attach at the shoulder (proximal). One of the tendons that attaches at the shoulder runs through a groove in the bone before it attaches at the shoulder. This injury occurs when the tendon moves out of this groove during arm movement. This injury often occurs along with other problems in the shoulder. What are the causes? This injury can happen after an injury to the group of muscles and tendons that surround the shoulder joint (rotator cuff). This may be caused by: Strenuous or repetitive movements of the shoulder joint. Sudden, forceful movements when the arm is extended away from the body. What increases the risk? You are more likely to develop this condition if: You do contact sports, throwing sports, weight lifting, and bodybuilding. You do heavy labor, especially involving lifting and overhead work. You have poor strength and flexibility. You do not warm up properly before practice or play. What are the signs or symptoms? Common symptoms of this condition include: A crackling, clicking, or popping sound that occurs when the arm is moved away from the body and the shoulder is rotated outward. This may occur with or without pain. Pain and tenderness in the front of the shoulder. Pain that increases with shoulder and elbow movement, such as bending the elbow and turning your palm upward. How is this diagnosed? This injury can be difficult to diagnose because the symptoms are similar to those of many other upper arm and  shoulder problems. The injury may be diagnosed based on: Your symptoms and medical history. Your health care provider will ask for details about your injury and ask about activities that make your symptoms worse. A physical exam. Imaging tests, such as X-rays, MRI, or ultrasound. How is this treated? Treatment for this condition depends on the severity of the condition. For mild injuries, you may use: Medicines to control pain. Ice to reduce swelling (inflammation). A sling to support and keep the shoulder from moving. For severe injuries, you may need surgery: To put the biceps tendon back into the proper position and to prevent the injury from happening again. To repair other injuries that may have occurred in your shoulder. You may also need to work with a physical therapist to strengthen and stabilize the injured area. Follow these instructions at home: Medicines Take over-the-counter and prescription medicines only as told by your provider. Ask your provider if the medicine prescribed to you: Requires you to avoid driving or using machinery. Can cause constipation. You may need to take these actions to prevent or treat constipation: Drink enough fluid to keep your pee (urine) pale yellow. Take over-the-counter or prescription medicines. Eat foods that are high in fiber, such as beans, whole grains, and fresh fruits and vegetables. Limit foods that are high in fat and processed sugars, such as fried or sweet foods. If you have a removable sling: Wear the sling as told by your provider. Remove it only as told by your provider. Check the skin around the sling every day. Tell your provider about any concerns. Loosen  the sling if your fingers tingle, become numb, or turn cold or blue. If the sling is not waterproof: Do not let it get wet. Cover it with a watertight covering when you take a bath or a shower. Managing pain, stiffness, and swelling  If told, put ice on the injured  area. If you have a removable sling, remove it as told by your provider. Put ice in a plastic bag. Place a towel between your skin and the bag. Leave the ice on for 20 minutes, 2-3 times per day. If your skin turns bright red, remove the ice right away to prevent skin damage. The risk of damage is higher if you cannot feel pain, heat, or cold. Move your fingers often to reduce stiffness and swelling. Raise (elevate) the injured area above the level of your heart while you are sitting or lying down. General instructions You may have to avoid lifting. Ask your provider how much you can safely lift. Ask your provider when it is safe to drive if you have a sling on your arm. Do exercises as told by your provider or physical therapist. Do not use any products that contain nicotine or tobacco. These products include cigarettes, chewing tobacco, and vaping devices, such as e-cigarettes. These can delay bone healing. If you need help quitting, ask your provider. Return to your normal activities as told by your provider. Ask your provider what activities are safe for you. Keep all follow-up visits. Your provider will monitor your injury. Contact a health care provider if: Your symptoms get worse or they do not improve with your treatment plan. You have new or unexplained symptoms, such as pain, tingling, or numbness in or near the injured area. This information is not intended to replace advice given to you by your health care provider. Make sure you discuss any questions you have with your health care provider. Document Revised: 05/05/2022 Document Reviewed: 05/05/2022 Elsevier Patient Education  2024 [employer].

## 2023-09-09 LAB — COMPLETE METABOLIC PANEL WITH GFR
AG Ratio: 1.2 (calc) (ref 1.0–2.5)
ALT: 13 U/L (ref 6–29)
AST: 16 U/L (ref 10–35)
Albumin: 4.3 g/dL (ref 3.6–5.1)
Alkaline phosphatase (APISO): 111 U/L (ref 37–153)
BUN: 19 mg/dL (ref 7–25)
CO2: 31 mmol/L (ref 20–32)
Calcium: 10.1 mg/dL (ref 8.6–10.4)
Chloride: 101 mmol/L (ref 98–110)
Creat: 1 mg/dL (ref 0.50–1.05)
Globulin: 3.6 g/dL (ref 1.9–3.7)
Glucose, Bld: 88 mg/dL (ref 65–99)
Potassium: 4.5 mmol/L (ref 3.5–5.3)
Sodium: 141 mmol/L (ref 135–146)
Total Bilirubin: 0.5 mg/dL (ref 0.2–1.2)
Total Protein: 7.9 g/dL (ref 6.1–8.1)
eGFR: 61 mL/min/{1.73_m2} (ref >=60)

## 2023-09-09 LAB — CBC WITH DIFFERENTIAL/PLATELET
Absolute Lymphocytes: 2131 {cells}/uL (ref 850–3900)
Absolute Monocytes: 605 {cells}/uL (ref 200–950)
Basophils Absolute: 72 {cells}/uL (ref 0–200)
Basophils Relative: 1 %
Eosinophils Absolute: 144 {cells}/uL (ref 15–500)
Eosinophils Relative: 2 %
HCT: 43.2 % (ref 35.0–45.0)
Hemoglobin: 14.6 g/dL (ref 11.7–15.5)
MCH: 31 pg (ref 27.0–33.0)
MCHC: 33.8 g/dL (ref 32.0–36.0)
MCV: 91.7 fL (ref 80.0–100.0)
MPV: 9.2 fL (ref 7.5–12.5)
Monocytes Relative: 8.4 %
Neutro Abs: 4248 {cells}/uL (ref 1500–7800)
Neutrophils Relative %: 59 %
Platelets: 267 10*3/uL (ref 140–400)
RBC: 4.71 10*6/uL (ref 3.80–5.10)
RDW: 13.2 % (ref 11.0–15.0)
Total Lymphocyte: 29.6 %
WBC: 7.2 10*3/uL (ref 3.8–10.8)

## 2023-09-09 LAB — URINALYSIS, ROUTINE W REFLEX MICROSCOPIC
Bilirubin Urine: NEGATIVE
Glucose, UA: NEGATIVE
Hgb urine dipstick: NEGATIVE
Ketones, ur: NEGATIVE
Leukocytes,Ua: NEGATIVE
Nitrite: NEGATIVE
Protein, ur: NEGATIVE
Specific Gravity, Urine: 1.024 (ref 1.001–1.035)
pH: 5.5 (ref 5.0–8.0)

## 2023-09-09 LAB — LIPID PANEL
Cholesterol: 163 mg/dL (ref <200)
HDL: 51 mg/dL (ref >=50)
LDL Cholesterol (Calc): 75 mg/dL
Non-HDL Cholesterol (Calc): 112 mg/dL (ref <130)
Total CHOL/HDL Ratio: 3.2 (calc) (ref <5.0)
Triglycerides: 282 mg/dL — ABNORMAL HIGH (ref <150)

## 2023-09-09 LAB — MICROALBUMIN / CREATININE URINE RATIO
Creatinine, Urine: 157 mg/dL (ref 20–275)
Microalb Creat Ratio: 2 mg/g{creat} (ref <30)
Microalb, Ur: 0.3 mg/dL

## 2023-09-09 LAB — TSH: TSH: 4.51 m[IU]/L — ABNORMAL HIGH (ref 0.40–4.50)

## 2023-09-09 LAB — HEMOGLOBIN A1C W/OUT EAG: Hgb A1c MFr Bld: 5.7 %{Hb} — ABNORMAL HIGH (ref <5.7)

## 2023-09-09 LAB — MAGNESIUM: Magnesium: 2.3 mg/dL (ref 1.5–2.5)

## 2023-09-09 LAB — VITAMIN D 25 HYDROXY (VIT D DEFICIENCY, FRACTURES): Vit D, 25-Hydroxy: 75 ng/mL (ref 30–100)

## 2023-09-10 ENCOUNTER — Telehealth: Payer: Self-pay | Admitting: Nurse Practitioner

## 2023-09-10 ENCOUNTER — Other Ambulatory Visit: Payer: Self-pay | Admitting: Nurse Practitioner

## 2023-09-10 DIAGNOSIS — M79622 Pain in left upper arm: Secondary | ICD-10-CM

## 2023-09-10 MED ORDER — PREDNISONE 20 MG PO TABS: ORAL_TABLET | ORAL | 0 refills | Status: AC

## 2023-09-10 NOTE — Telephone Encounter (Signed)
I sent the prednisone taper to her pharmacy

## 2023-09-10 NOTE — Telephone Encounter (Signed)
Patient said it was discussed at last appointment when she showed you her arm, that you were going to send in prednisone or refer her to orthopedics... She said that maybe it was misconstrued but she wanted the prednisone sent in and then if that does not help, she wants to be referred to ortho. Please send medication to  [facility] - [place], [place] - [address]

## 2023-09-16 DIAGNOSIS — M5136 Other intervertebral disc degeneration, lumbar region with discogenic back pain only: Secondary | ICD-10-CM | POA: Diagnosis not present

## 2023-09-16 DIAGNOSIS — M9903 Segmental and somatic dysfunction of lumbar region: Secondary | ICD-10-CM | POA: Diagnosis not present

## 2023-09-16 DIAGNOSIS — M9904 Segmental and somatic dysfunction of sacral region: Secondary | ICD-10-CM | POA: Diagnosis not present

## 2023-09-16 DIAGNOSIS — M9905 Segmental and somatic dysfunction of pelvic region: Secondary | ICD-10-CM | POA: Diagnosis not present

## 2023-09-23 DIAGNOSIS — M9905 Segmental and somatic dysfunction of pelvic region: Secondary | ICD-10-CM | POA: Diagnosis not present

## 2023-09-23 DIAGNOSIS — M5136 Other intervertebral disc degeneration, lumbar region with discogenic back pain only: Secondary | ICD-10-CM | POA: Diagnosis not present

## 2023-09-23 DIAGNOSIS — M9904 Segmental and somatic dysfunction of sacral region: Secondary | ICD-10-CM | POA: Diagnosis not present

## 2023-09-23 DIAGNOSIS — M9903 Segmental and somatic dysfunction of lumbar region: Secondary | ICD-10-CM | POA: Diagnosis not present

## 2023-10-01 ENCOUNTER — Other Ambulatory Visit: Payer: Self-pay | Admitting: Internal Medicine

## 2023-10-01 DIAGNOSIS — E039 Hypothyroidism, unspecified: Secondary | ICD-10-CM

## 2023-10-05 DIAGNOSIS — M9903 Segmental and somatic dysfunction of lumbar region: Secondary | ICD-10-CM | POA: Diagnosis not present

## 2023-10-05 DIAGNOSIS — M5136 Other intervertebral disc degeneration, lumbar region with discogenic back pain only: Secondary | ICD-10-CM | POA: Diagnosis not present

## 2023-10-05 DIAGNOSIS — M9904 Segmental and somatic dysfunction of sacral region: Secondary | ICD-10-CM | POA: Diagnosis not present

## 2023-10-05 DIAGNOSIS — M9905 Segmental and somatic dysfunction of pelvic region: Secondary | ICD-10-CM | POA: Diagnosis not present

## 2023-10-21 DIAGNOSIS — M9905 Segmental and somatic dysfunction of pelvic region: Secondary | ICD-10-CM | POA: Diagnosis not present

## 2023-10-21 DIAGNOSIS — M5136 Other intervertebral disc degeneration, lumbar region with discogenic back pain only: Secondary | ICD-10-CM | POA: Diagnosis not present

## 2023-10-21 DIAGNOSIS — M9904 Segmental and somatic dysfunction of sacral region: Secondary | ICD-10-CM | POA: Diagnosis not present

## 2023-10-21 DIAGNOSIS — M9903 Segmental and somatic dysfunction of lumbar region: Secondary | ICD-10-CM | POA: Diagnosis not present

## 2023-11-16 ENCOUNTER — Encounter: Payer: Self-pay | Admitting: *Deleted

## 2023-11-18 DIAGNOSIS — M9904 Segmental and somatic dysfunction of sacral region: Secondary | ICD-10-CM | POA: Diagnosis not present

## 2023-11-18 DIAGNOSIS — M9903 Segmental and somatic dysfunction of lumbar region: Secondary | ICD-10-CM | POA: Diagnosis not present

## 2023-11-18 DIAGNOSIS — M5136 Other intervertebral disc degeneration, lumbar region with discogenic back pain only: Secondary | ICD-10-CM | POA: Diagnosis not present

## 2023-11-18 DIAGNOSIS — M9905 Segmental and somatic dysfunction of pelvic region: Secondary | ICD-10-CM | POA: Diagnosis not present

## 2023-11-24 ENCOUNTER — Other Ambulatory Visit: Payer: Self-pay

## 2023-11-24 MED ORDER — ROSUVASTATIN CALCIUM 5 MG PO TABS: 5.0000 mg | ORAL_TABLET | Freq: Every day | ORAL | 0 refills | Status: DC

## 2023-12-16 DIAGNOSIS — M5136 Other intervertebral disc degeneration, lumbar region with discogenic back pain only: Secondary | ICD-10-CM | POA: Diagnosis not present

## 2023-12-16 DIAGNOSIS — M9904 Segmental and somatic dysfunction of sacral region: Secondary | ICD-10-CM | POA: Diagnosis not present

## 2023-12-16 DIAGNOSIS — M9905 Segmental and somatic dysfunction of pelvic region: Secondary | ICD-10-CM | POA: Diagnosis not present

## 2023-12-16 DIAGNOSIS — M9903 Segmental and somatic dysfunction of lumbar region: Secondary | ICD-10-CM | POA: Diagnosis not present

## 2023-12-24 ENCOUNTER — Ambulatory Visit: Payer: Medicare HMO | Admitting: Nurse Practitioner

## 2023-12-27 ENCOUNTER — Ambulatory Visit: Payer: Medicare HMO | Admitting: Nurse Practitioner

## 2024-01-04 ENCOUNTER — Ambulatory Visit (INDEPENDENT_AMBULATORY_CARE_PROVIDER_SITE_OTHER): Payer: Medicare HMO | Admitting: Family Medicine

## 2024-01-04 ENCOUNTER — Encounter: Payer: Self-pay | Admitting: Family Medicine

## 2024-01-04 VITALS — BP 128/80 | HR 63 | Ht 66.5 in | Wt 201.4 lb

## 2024-01-04 DIAGNOSIS — I10 Essential (primary) hypertension: Secondary | ICD-10-CM

## 2024-01-04 DIAGNOSIS — E782 Mixed hyperlipidemia: Secondary | ICD-10-CM

## 2024-01-04 DIAGNOSIS — Z7689 Persons encountering health services in other specified circumstances: Secondary | ICD-10-CM

## 2024-01-04 DIAGNOSIS — E039 Hypothyroidism, unspecified: Secondary | ICD-10-CM | POA: Diagnosis not present

## 2024-01-04 NOTE — Progress Notes (Signed)
 New Patient Office Visit  Subjective   Patient ID: Denise Jensen, female    DOB: September 18, 1956  Age: 68 y.o. MRN: [id number]  CC:  Chief Complaint  Patient presents with  . New Patient (Initial Visit)    HPI Denise Jensen presents to establish care  Patient has no questions or concerns today.  No acute issues.  We spent much of the encounter talking about her previous issues with COVID infection, and long-term effects of that including her extensive workup of her vision changes that first appeared after COVID and have improved but still present.  PMH: anxiety, htn, hypothyroid, hld, vertigo since 68 years old.    Med: vit d, nexium, allegra, rosuvastatin, antivert, albuterol, bisoprolol - hydrochlorothiazide, synthroid 112  Double vision - has seen ophthalmology.  Went to [place].  Chiropracter idid the epley maneuver.  Still has trouble with focusing.    PSH: no  FH: heart disease - father, breast cancer - sister .  Tobacco use: quit over 40 years ago.   Alcohol use: no Drug use: no Marital status: married.  Employment: retired.    Screenings:  Colon Cancer: Willing to do Cologuard Breast Cancer: Needs new mammogram order.    Outpatient Encounter Medications as of 01/04/2024  Medication Sig  . albuterol (VENTOLIN HFA) 108 (90 Base) MCG/ACT inhaler Inhale 2 puffs into the lungs every 6 (six) hours as needed for wheezing or shortness of breath.  . bisoprolol-hydrochlorothiazide (ZIAC) 5-6.25 MG tablet TAKE 1 TABLET DAILY FOR BLOOD PRESSURE.  . cholecalciferol (VITAMIN D) 1000 units tablet Take 5,000 Units by mouth daily.  Marland Kitchen esomeprazole (NEXIUM) 20 MG capsule Take 20 mg by mouth daily at 12 noon.  Marland Kitchen Fexofenadine HCl (ALLEGRA PO) Take by mouth.  . levothyroxine (SYNTHROID) 112 MCG tablet TAKE 1 TAB DAILY EXCEPT 1/2 TAB ON SUNDAY. TAKE ON EMPTY STOMACH WITH ONLY WATER FOR 30 MINUTES & NO ANTACIDS,CALCIUM OR MAGNESIUM FOR 4 HOURS, AVOID BIOTIN.  Marland Kitchen meclizine  (ANTIVERT) 25 MG tablet Take 1 tablet (25 mg total) by mouth 3 (three) times daily as needed for dizziness.  . rosuvastatin (CRESTOR) 5 MG tablet Take 1 tablet (5 mg total) by mouth daily.  . [DISCONTINUED] cetirizine (ZYRTEC) 10 MG tablet Take 10 mg by mouth daily. (Patient not taking: Reported on 09/08/2023)  . [DISCONTINUED] Levocetirizine Dihydrochloride (XYZAL PO) Take 1 tablet by mouth daily. (Patient not taking: Reported on 12/24/2022)  . [DISCONTINUED] Triamcinolone Acetonide (NASACORT AQ NA) Place 1 spray into the nose daily as needed. OTC (Patient not taking: Reported on 09/07/2022)   No facility-administered encounter medications on file as of 01/04/2024.    Past Medical History:  Diagnosis Date  . GERD (gastroesophageal reflux disease)   . HTN (hypertension)   . Unspecified hypothyroidism   . Unspecified vitamin D deficiency     Past Surgical History:  Procedure Laterality Date  . NO PAST SURGERIES      Family History  Problem Relation Age of Onset  . Depression Mother   . Arthritis Mother   . Hypertension Mother   . Kidney disease Mother   . Heart disease Father   . Depression Sister   . Breast cancer Sister 30    Social History   Socioeconomic History  . Marital status: Married    Spouse name: Not on file  . Number of children: Not on file  . Years of education: Not on file  . Highest education level: Not on file  Occupational History  .  Not on file  Tobacco Use  . Smoking status: Former    Current packs/day: 0.00    Average packs/day: 0.5 packs/day for 10.0 years (5.0 ttl pk-yrs)    Types: Cigarettes    Start date: 26    Quit date: 10/05/1981    Years since quitting: 42.2  . Smokeless tobacco: Never  Vaping Use  . Vaping status: Never Used  Substance and Sexual Activity  . Alcohol use: No  . Drug use: No  . Sexual activity: Yes    Partners: Male    Birth control/protection: Post-menopausal  Other Topics Concern  . Not on file  Social History  Narrative  . Not on file   Social Drivers of Health   Financial Resource Strain: Not on file  Food Insecurity: Not on file  Transportation Needs: Not on file  Physical Activity: Not on file  Stress: Not on file  Social Connections: Not on file  Intimate Partner Violence: Not on file    ROS     Objective   BP 128/80   Pulse 63   Ht 5' 6.5" (1.689 m)   Wt 201 lb 6.4 oz (91.4 kg)   LMP  (LMP Unknown)   SpO2 94%   BMI 32.02 kg/m   Physical Exam General: Alert, oriented HEENT: PERRLA, EOMI, moist mucosa.  Complaints of worsening blurriness with cover/uncover testing. CV: Regular rate rhythm no murmurs Pulmonary: Lungs clear bilaterally no wheeze or crackles GI: Soft, nontender. Extremities: No pedal edema Psych: Pleasant Skin warm and dry     Assessment & Plan:   Encounter to establish care  Primary hypertension Assessment & Plan: At goal today.  Continue bisoprolol-chlorothiazide.  Orders: -     Comprehensive metabolic panel with GFR  Hypothyroidism, unspecified type Assessment & Plan: Currently taking her Synthroid 1 tablet daily, which is an increase from taking a half a tablet on Sunday and full tablet the other 6 days.  Recheck TSH today.  Orders: -     TSH  Mixed hyperlipidemia Assessment & Plan: Continue rosuvastatin.  Rechecking cholesterol level  Orders: -     Comprehensive metabolic panel with GFR -     Lipid panel    Return in about 6 months (around 07/05/2024) for hld, HTN.   [name], MD

## 2024-01-04 NOTE — Assessment & Plan Note (Signed)
 Continue rosuvastatin.  Rechecking cholesterol level

## 2024-01-04 NOTE — Assessment & Plan Note (Addendum)
 At goal today.  Continue bisoprolol-chlorothiazide.

## 2024-01-04 NOTE — Patient Instructions (Signed)
 It was nice to see you today,  We addressed the following topics today: - we will get some labs and I will let you know the results when I get them.   - we will see you back in 6 months .  Have a great day,  Frederic Jericho, MD

## 2024-01-04 NOTE — Assessment & Plan Note (Signed)
 Currently taking her Synthroid 1 tablet daily, which is an increase from taking a half a tablet on Sunday and full tablet the other 6 days.  Recheck TSH today.

## 2024-01-05 LAB — COMPREHENSIVE METABOLIC PANEL WITH GFR
ALT: 17 IU/L (ref 0–32)
AST: 21 IU/L (ref 0–40)
Albumin: 4.6 g/dL (ref 3.9–4.9)
Alkaline Phosphatase: 125 IU/L — ABNORMAL HIGH (ref 44–121)
BUN/Creatinine Ratio: 12 (ref 12–28)
BUN: 12 mg/dL (ref 8–27)
Bilirubin Total: 0.4 mg/dL (ref 0.0–1.2)
CO2: 22 mmol/L (ref 20–29)
Calcium: 9.8 mg/dL (ref 8.7–10.3)
Chloride: 102 mmol/L (ref 96–106)
Creatinine, Ser: 1.04 mg/dL — ABNORMAL HIGH (ref 0.57–1.00)
Globulin, Total: 3 g/dL (ref 1.5–4.5)
Glucose: 96 mg/dL (ref 70–99)
Potassium: 4.5 mmol/L (ref 3.5–5.2)
Sodium: 139 mmol/L (ref 134–144)
Total Protein: 7.6 g/dL (ref 6.0–8.5)
eGFR: 58 mL/min/{1.73_m2} — ABNORMAL LOW (ref >59)

## 2024-01-05 LAB — LIPID PANEL
Chol/HDL Ratio: 3.3 ratio (ref 0.0–4.4)
Cholesterol, Total: 173 mg/dL (ref 100–199)
HDL: 53 mg/dL (ref >39)
LDL Chol Calc (NIH): 79 mg/dL (ref 0–99)
Triglycerides: 252 mg/dL — ABNORMAL HIGH (ref 0–149)
VLDL Cholesterol Cal: 41 mg/dL — ABNORMAL HIGH (ref 5–40)

## 2024-01-05 LAB — TSH: TSH: 1.32 u[IU]/mL (ref 0.450–4.500)

## 2024-01-19 DIAGNOSIS — M9905 Segmental and somatic dysfunction of pelvic region: Secondary | ICD-10-CM | POA: Diagnosis not present

## 2024-01-19 DIAGNOSIS — M9904 Segmental and somatic dysfunction of sacral region: Secondary | ICD-10-CM | POA: Diagnosis not present

## 2024-01-19 DIAGNOSIS — M9903 Segmental and somatic dysfunction of lumbar region: Secondary | ICD-10-CM | POA: Diagnosis not present

## 2024-01-19 DIAGNOSIS — M5136 Other intervertebral disc degeneration, lumbar region with discogenic back pain only: Secondary | ICD-10-CM | POA: Diagnosis not present

## 2024-02-19 ENCOUNTER — Other Ambulatory Visit: Payer: Self-pay | Admitting: Family Medicine

## 2024-02-21 ENCOUNTER — Other Ambulatory Visit: Payer: Self-pay | Admitting: Family Medicine

## 2024-02-21 NOTE — Telephone Encounter (Signed)
 Copied from CRM [id number]. Topic: Clinical - Medication Refill >> Feb 21, 2024 11:43 AM Lynnie Saucier S wrote: Medication: bisoprolol -hydrochlorothiazide  (ZIAC ) 5-6.25 MG tablet rosuvastatin  (CRESTOR ) 5 MG tablet  Has the patient contacted their pharmacy? Yes (Agent: If no, request that the patient contact the pharmacy for the refill. If patient does not wish to contact the pharmacy document the reason why and proceed with request.) (Agent: If yes, when and what did the pharmacy advise?)Refill has not been sent  This is the patient's preferred pharmacy:  [facility] - [place], [place] - [address] [place] [address] Phone: [phone] Fax: [phone]  Is this the correct pharmacy for this prescription? Yes If no, delete pharmacy and type the correct one.   Has the prescription been filled recently? No  Is the patient out of the medication? No  Has the patient been seen for an appointment in the last year OR does the patient have an upcoming appointment? No  Can we respond through MyChart? No  Agent: Please be advised that Rx refills may take up to 3 business days. We ask that you follow-up with your pharmacy.

## 2024-02-22 DIAGNOSIS — M5136 Other intervertebral disc degeneration, lumbar region with discogenic back pain only: Secondary | ICD-10-CM | POA: Diagnosis not present

## 2024-02-22 DIAGNOSIS — M9903 Segmental and somatic dysfunction of lumbar region: Secondary | ICD-10-CM | POA: Diagnosis not present

## 2024-02-22 DIAGNOSIS — M9904 Segmental and somatic dysfunction of sacral region: Secondary | ICD-10-CM | POA: Diagnosis not present

## 2024-02-22 DIAGNOSIS — M9905 Segmental and somatic dysfunction of pelvic region: Secondary | ICD-10-CM | POA: Diagnosis not present

## 2024-02-29 DIAGNOSIS — M9903 Segmental and somatic dysfunction of lumbar region: Secondary | ICD-10-CM | POA: Diagnosis not present

## 2024-02-29 DIAGNOSIS — M5136 Other intervertebral disc degeneration, lumbar region with discogenic back pain only: Secondary | ICD-10-CM | POA: Diagnosis not present

## 2024-02-29 DIAGNOSIS — M9905 Segmental and somatic dysfunction of pelvic region: Secondary | ICD-10-CM | POA: Diagnosis not present

## 2024-02-29 DIAGNOSIS — M9904 Segmental and somatic dysfunction of sacral region: Secondary | ICD-10-CM | POA: Diagnosis not present

## 2024-03-07 DIAGNOSIS — M9904 Segmental and somatic dysfunction of sacral region: Secondary | ICD-10-CM | POA: Diagnosis not present

## 2024-03-07 DIAGNOSIS — M9905 Segmental and somatic dysfunction of pelvic region: Secondary | ICD-10-CM | POA: Diagnosis not present

## 2024-03-07 DIAGNOSIS — M9903 Segmental and somatic dysfunction of lumbar region: Secondary | ICD-10-CM | POA: Diagnosis not present

## 2024-03-07 DIAGNOSIS — M5136 Other intervertebral disc degeneration, lumbar region with discogenic back pain only: Secondary | ICD-10-CM | POA: Diagnosis not present

## 2024-03-14 DIAGNOSIS — M5136 Other intervertebral disc degeneration, lumbar region with discogenic back pain only: Secondary | ICD-10-CM | POA: Diagnosis not present

## 2024-03-14 DIAGNOSIS — M9905 Segmental and somatic dysfunction of pelvic region: Secondary | ICD-10-CM | POA: Diagnosis not present

## 2024-03-14 DIAGNOSIS — M9904 Segmental and somatic dysfunction of sacral region: Secondary | ICD-10-CM | POA: Diagnosis not present

## 2024-03-14 DIAGNOSIS — M9903 Segmental and somatic dysfunction of lumbar region: Secondary | ICD-10-CM | POA: Diagnosis not present

## 2024-03-21 DIAGNOSIS — M9905 Segmental and somatic dysfunction of pelvic region: Secondary | ICD-10-CM | POA: Diagnosis not present

## 2024-03-21 DIAGNOSIS — M9904 Segmental and somatic dysfunction of sacral region: Secondary | ICD-10-CM | POA: Diagnosis not present

## 2024-03-21 DIAGNOSIS — M9903 Segmental and somatic dysfunction of lumbar region: Secondary | ICD-10-CM | POA: Diagnosis not present

## 2024-03-21 DIAGNOSIS — M5136 Other intervertebral disc degeneration, lumbar region with discogenic back pain only: Secondary | ICD-10-CM | POA: Diagnosis not present

## 2024-03-28 DIAGNOSIS — M9903 Segmental and somatic dysfunction of lumbar region: Secondary | ICD-10-CM | POA: Diagnosis not present

## 2024-03-28 DIAGNOSIS — M9904 Segmental and somatic dysfunction of sacral region: Secondary | ICD-10-CM | POA: Diagnosis not present

## 2024-03-28 DIAGNOSIS — M9905 Segmental and somatic dysfunction of pelvic region: Secondary | ICD-10-CM | POA: Diagnosis not present

## 2024-03-28 DIAGNOSIS — M5136 Other intervertebral disc degeneration, lumbar region with discogenic back pain only: Secondary | ICD-10-CM | POA: Diagnosis not present

## 2024-04-04 DIAGNOSIS — M9905 Segmental and somatic dysfunction of pelvic region: Secondary | ICD-10-CM | POA: Diagnosis not present

## 2024-04-04 DIAGNOSIS — M5136 Other intervertebral disc degeneration, lumbar region with discogenic back pain only: Secondary | ICD-10-CM | POA: Diagnosis not present

## 2024-04-04 DIAGNOSIS — M9904 Segmental and somatic dysfunction of sacral region: Secondary | ICD-10-CM | POA: Diagnosis not present

## 2024-04-04 DIAGNOSIS — M9903 Segmental and somatic dysfunction of lumbar region: Secondary | ICD-10-CM | POA: Diagnosis not present

## 2024-04-18 DIAGNOSIS — M9905 Segmental and somatic dysfunction of pelvic region: Secondary | ICD-10-CM | POA: Diagnosis not present

## 2024-04-18 DIAGNOSIS — M9903 Segmental and somatic dysfunction of lumbar region: Secondary | ICD-10-CM | POA: Diagnosis not present

## 2024-04-18 DIAGNOSIS — M5136 Other intervertebral disc degeneration, lumbar region with discogenic back pain only: Secondary | ICD-10-CM | POA: Diagnosis not present

## 2024-04-18 DIAGNOSIS — M9904 Segmental and somatic dysfunction of sacral region: Secondary | ICD-10-CM | POA: Diagnosis not present

## 2024-05-23 ENCOUNTER — Other Ambulatory Visit: Payer: Self-pay | Admitting: Family Medicine

## 2024-07-05 ENCOUNTER — Encounter: Payer: Self-pay | Admitting: Family Medicine

## 2024-07-05 ENCOUNTER — Ambulatory Visit: Admitting: Family Medicine

## 2024-07-05 VITALS — BP 135/80 | HR 84 | Ht 66.5 in | Wt 203.4 lb

## 2024-07-05 DIAGNOSIS — I10 Essential (primary) hypertension: Secondary | ICD-10-CM

## 2024-07-05 DIAGNOSIS — E782 Mixed hyperlipidemia: Secondary | ICD-10-CM

## 2024-07-05 DIAGNOSIS — E559 Vitamin D deficiency, unspecified: Secondary | ICD-10-CM

## 2024-07-05 DIAGNOSIS — E663 Overweight: Secondary | ICD-10-CM | POA: Diagnosis not present

## 2024-07-05 DIAGNOSIS — J310 Chronic rhinitis: Secondary | ICD-10-CM

## 2024-07-05 DIAGNOSIS — E039 Hypothyroidism, unspecified: Secondary | ICD-10-CM

## 2024-07-05 MED ORDER — AZELASTINE HCL 0.1 % NA SOLN: 1.0000 | Freq: Two times a day (BID) | NASAL | 12 refills | Status: AC

## 2024-07-05 MED ORDER — TRIAMCINOLONE ACETONIDE 55 MCG/ACT NA AERO: 2.0000 | INHALATION_SPRAY | Freq: Every day | NASAL | 12 refills | Status: AC

## 2024-07-05 NOTE — Assessment & Plan Note (Signed)
 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 Patient reports significant morning symptoms including hoarseness and throat clearing, with a sensation of constant drainage. Has tried multiple OTC antihistamines with variable and temporary relief. Mucinex caused significant side effects. - Start Flonase, Nasonex, or Nasacort nasal spray once daily. - Start azelastine  (Astelin ) nasal spray twice daily. - A paper prescription will be provided for these OTC medications. - Continue rotating oral antihistamines as needed. - If symptoms do not improve, may consider a trial of ipratropium nasal spray. Patient can message or call to request a prescription if needed. - Follow up scheduled for March for annual physical exam. Will defer routine labs until then. - Staff to perform manual blood pressure check with patient lying down due to history of vasovagal syncope.

## 2024-07-05 NOTE — Patient Instructions (Signed)
 It was nice to see you today,  We addressed the following topics today: -I am going to add Nasacort and azelastine  sprays. - Nasacort is 2 sprays each nostril once a day and azelastine  is 1 spray each nostril twice a day. - If you feel like neither of these are helping or not adequately helping the next medication I would try is nasal ipratropium which requires a prescription.   Have a great day,  Rolan Slain, MD

## 2024-07-05 NOTE — Progress Notes (Unsigned)
 Established Patient Office Visit  Subjective   Patient ID: Denise Jensen, female    DOB: 03/31/1956  Age: 68 y.o. MRN: [id number]  Chief Complaint  Patient presents with   Medical Management of Chronic Issues    HPI  Subjective - Post-COVID sinus issues for 4 years. Reports significant post-nasal drip, worst in the morning, causing hoarse, raspy voice. Has tried sinus flushing and various over-the-counter allergy medications. Finds some relief with Allegra  but feels its efficacy wanes. Claritin was not effective. Reports a sensation of drainage down the back of the throat. - History of Mucinex use causing jitteriness and insomnia for three days; discontinued use. - History of right rotator cuff injury 3 months ago, with pain and limited range of motion. Completed physical therapy which improved function, allowing for easier dressing. Persistent soreness remains. 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 Has a history of passing out after blood draws. Prefers to have blood drawn while lying down. - Weight has been stable, around 201-206 lbs over the last year. Reports trying to eat less but not exercising. Goal is to lose weight.  Medications Switches between over-the-counter antihistamines every few months for allergy symptoms, including Allegra  24-hour and Zyrtec. Considers switching back to Zyrtec at night. Occasionally uses nasal corticosteroid sprays but not daily due to a feeling of irritation.  PMH, PSH, FH, Social Hx PMHx: Post-COVID syndrome with chronic sinus issues, history of high triglycerides (stable for 3 years), back problems post-fall, right rotator cuff injury, vasovagal syncope. Thyroid  function normal on last labs 6 months ago. Liver and kidney function tests were normal 6 months ago. PSH: None mentioned. FH: None mentioned. Social Hx: Sees a Land (Dr. Danial) for back problems.  ROS HEENT: Positive for post-nasal drip, hoarseness, throat clearing, and  sensation of drainage in the throat. Denies daily use of nasal corticosteroid sprays. MSK: Positive for chronic back pain and intermittent arm numbness. Positive for residual soreness in the right shoulder after rotator cuff injury. CONSTITUTIONAL: Positive for vasovagal syncope with phlebotomy. Denies fever, chills. NEURO: Positive for intermittent arm numbness.   The 10-year ASCVD risk score (Arnett DK, et al., 2019) is: 12.1%  Health Maintenance Due  Topic Date Due   COVID-19 Vaccine (1) Never done   Mammogram  04/17/2019   DEXA SCAN  Never done   Medicare Annual Wellness (AWV)  12/24/2023      Objective:     BP 135/80   Pulse 84   Ht 5' 6.5 (1.689 m)   Wt 203 lb 6.4 oz (92.3 kg)   LMP  (LMP Unknown)   SpO2 98%   BMI 32.34 kg/m    Physical Exam Gen: alert, oriented Pulm: no respiratory distress Psych: pleasant affect   No results found for any visits on 07/05/24.      Assessment & Plan:   Primary hypertension  Mixed hyperlipidemia -     Lipid panel; Future -     Hemoglobin A1c; Future  Overweight (BMI 25.0-29.9) -     Comprehensive metabolic panel with GFR; Future  Vitamin D  deficiency -     Comprehensive metabolic panel with GFR; Future -     CBC with Differential/Platelet; Future -     VITAMIN D  25 Hydroxy (Vit-D Deficiency, Fractures); Future  Hypothyroidism, unspecified type -     Comprehensive metabolic panel with GFR; Future -     CBC with Differential/Platelet; Future -     TSH; Future  Rhinitis, chronic Assessment & Plan: Chronic rhinitis/post-nasal  drip, 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 Patient reports significant morning symptoms including hoarseness and throat clearing, with a sensation of constant drainage. Has tried multiple OTC antihistamines with variable and temporary relief. Mucinex caused significant side effects. - Start Flonase, Nasonex, or Nasacort nasal spray once daily. - Start azelastine  (Astelin ) nasal spray twice daily. - A  paper prescription will be provided for these OTC medications. - Continue rotating oral antihistamines as needed. - If symptoms do not improve, may consider a trial of ipratropium nasal spray. Patient can message or call to request a prescription if needed. - Follow up scheduled for March for annual physical exam. Will defer routine labs until then. - Staff to perform manual blood pressure check with patient lying down due to history of vasovagal syncope.   Other orders -     Triamcinolone Acetonide; Place 2 sprays into the nose daily.  Dispense: 1 each; Refill: 12 -     Azelastine  HCl; Place 1 spray into both nostrils 2 (two) times daily. Use in each nostril as directed  Dispense: 30 mL; Refill: 12     Return in about 6 months (around 01/03/2025) for physical.    Toribio MARLA Slain, MD

## 2024-07-20 ENCOUNTER — Ambulatory Visit

## 2024-08-24 ENCOUNTER — Other Ambulatory Visit: Payer: Self-pay | Admitting: Family Medicine

## 2024-09-07 ENCOUNTER — Encounter: Payer: Medicare HMO | Admitting: Nurse Practitioner

## 2024-09-08 ENCOUNTER — Ambulatory Visit: Payer: Self-pay

## 2024-09-08 ENCOUNTER — Ambulatory Visit (INDEPENDENT_AMBULATORY_CARE_PROVIDER_SITE_OTHER)

## 2024-09-08 VITALS — BP 131/86 | HR 94 | Temp 98.4°F | Ht 66.5 in | Wt 197.1 lb

## 2024-09-08 DIAGNOSIS — R059 Cough, unspecified: Secondary | ICD-10-CM | POA: Diagnosis not present

## 2024-09-08 DIAGNOSIS — M545 Low back pain, unspecified: Secondary | ICD-10-CM | POA: Diagnosis not present

## 2024-09-08 DIAGNOSIS — J101 Influenza due to other identified influenza virus with other respiratory manifestations: Secondary | ICD-10-CM

## 2024-09-08 LAB — POC SOFIA 2 FLU + SARS ANTIGEN FIA
Influenza A, POC: POSITIVE — AB
Influenza B, POC: NEGATIVE
SARS Coronavirus 2 Ag: NEGATIVE

## 2024-09-08 MED ORDER — CYCLOBENZAPRINE HCL 5 MG PO TABS: 5.0000 mg | ORAL_TABLET | Freq: Three times a day (TID) | ORAL | 0 refills | Status: AC | PRN

## 2024-09-08 MED ORDER — PROMETHAZINE-DM 6.25-15 MG/5ML PO SYRP: 5.0000 mL | ORAL_SOLUTION | Freq: Four times a day (QID) | ORAL | 0 refills | Status: AC | PRN

## 2024-09-08 NOTE — Patient Instructions (Signed)
 VISIT SUMMARY: During your visit, we discussed your recent gastrointestinal and respiratory symptoms, as well as new back pain. We have prescribed medications to help manage your symptoms and provided guidance on their use.  YOUR PLAN: ACUTE GASTROENTERITIS: You have symptoms of a viral stomach infection, likely worsened by a recent meal. -Take promethazine  DM as prescribed to help with nausea and cough suppression.  ACUTE UPPER RESPIRATORY INFECTION: You have symptoms of a viral respiratory infection. -Take promethazine  DM as prescribed to help with cough suppression and nausea relief.  ACUTE LOW BACK PAIN: You have severe back pain without pain radiating down your legs. You are already receiving chiropractic therapy. -Take cyclobenzaprine  5 mg for muscle relaxation. You can increase the dose to 10 mg if needed. -Alternate doses of cough syrup and muscle relaxer to avoid taking them at the same time.  If you have any problems before your next visit feel free to message me via MyChart (minor issues or questions) or call the office, otherwise you may reach out to schedule an office visit.  Thank you! Saddie Sacks, PA-C

## 2024-09-08 NOTE — Telephone Encounter (Signed)
 Patient is in office today 09/08/2024

## 2024-09-08 NOTE — Telephone Encounter (Signed)
 Please let her know Dr. Chandra is out office this week but I am happy to see her at 10:50. If she cannot make that, then I would recommend urgent care since we close at 12 today.

## 2024-09-08 NOTE — Telephone Encounter (Signed)
 FYI Only or Action Required?: Action required by provider: request for appointment.  Patient was last seen in primary care on 07/05/2024 by Chandra Toribio POUR, MD.  Called Nurse Triage reporting Cough.  Symptoms began a week ago.  Interventions attempted: OTC medications: Delsym, Advil Duo and Rest, hydration, or home remedies.  Symptoms are: unchanged.  Triage Disposition: Call PCP Within 24 Hours  Patient/caregiver understands and will follow disposition?: Yes    Copied from CRM [id number]. Topic: Clinical - Red Word Triage >> Sep 08, 2024  9:14 AM Rosina BIRCH wrote: Red Word that prompted transfer to Nurse Triage:patient husband called stating the patient is having aches and pains, diarrhea, coughing, her stomach is queasy and she feels terrible. Patient husband stated she threw up yesterday morning and that is what threw her back out.  Patient took delsym medication to stop the coughing and it did help Reason for Disposition  Patient is HIGH RISK (e.g., 65 years and older, pregnant, HIV+, or chronic medical condition)  Answer Assessment - Initial Assessment Questions Additional info: No appointments available in clinic today. Advised urgent care but they declined at this time. Plan for continued home supportive care with delsym, Advil, hydration, rest. Patient will proceed to urgent care for worsening symptoms.  Added to wait list for appointment today in clinic.    1. SYMPTOMS: What is your main symptom or concern? (e.g., cough, fever, shortness of breath, muscle aches)     Body aches 2. ONSET: When did the symptoms start?      Last week 3. COUGH: Do you have a cough? If Yes, ask: How bad is the cough?       Yes-clear  4. FEVER: Do you have a fever? If Yes, ask: What is your temperature, how was it measured, and when did it start?     denies 5. BREATHING DIFFICULTY: Are you having any difficulty breathing? (e.g., normal; shortness of breath, wheezing, unable to speak)       Denies  6. BETTER-SAME-WORSE: Are you getting better, staying the same or getting worse compared to yesterday?  If getting worse, ask, In what way?     worse 7. OTHER SYMPTOMS: Do you have any other symptoms?  (e.g., chills, fatigue, headache, loss of smell or taste, muscle pain, sore throat)     Body aches, nausea, vomited yesterday, back pain-Chiropractor yesterday 8. INFLUENZA EXPOSURE: Was there any known exposure to influenza (flu) before the symptoms began?       9. INFLUENZA SUSPECTED: Why do you think you have influenza? (e.g., positive flu self-test at home, symptoms after exposure).     symtpms 10. INFLUENZA VACCINE: Have you had the flu vaccine? If Yes, ask: When did you ast get it?        11. HIGH RISK FOR COMPLICATIONS: Do you have any chronic medical problems? (e.g., asthma, heart or lung disease, obesity, weak immune system)        12. PREGNANCY: Is there any chance you are pregnant? When was your last menstrual period?        13. O2 SATURATION MONITOR:  Do you use an oxygen saturation monitor (pulse oximeter) at home? If Yes, ask What is your reading (oxygen level) today? What is your usual oxygen saturation reading? (e.g., 95%)  Protocols used: Influenza (Flu) Suspected-A-AH

## 2024-09-11 ENCOUNTER — Other Ambulatory Visit: Payer: Self-pay

## 2024-09-11 ENCOUNTER — Ambulatory Visit: Payer: Self-pay

## 2024-09-11 DIAGNOSIS — J101 Influenza due to other identified influenza virus with other respiratory manifestations: Secondary | ICD-10-CM | POA: Insufficient documentation

## 2024-09-11 DIAGNOSIS — M545 Low back pain, unspecified: Secondary | ICD-10-CM | POA: Insufficient documentation

## 2024-09-11 MED ORDER — PREDNISONE 20 MG PO TABS: 40.0000 mg | ORAL_TABLET | Freq: Every day | ORAL | 0 refills | Status: AC

## 2024-09-11 NOTE — Telephone Encounter (Signed)
 FYI Only or Action Required?: Action required by provider: clinical question for provider, update on patient condition, and new medication request-Prednisone . Requests call back if unable to send prescription.   Patient was last seen in primary care on 09/08/2024 by Gayle Saddie FALCON, PA-C.  Called Nurse Triage reporting New Med Request and Pain.  Symptoms began several days ago.  Interventions attempted: Prescription medications: Flexaril, Rest, hydration, or home remedies, and Other: chiropractor.  Symptoms are: unchanged.  Triage Disposition: See HCP Within 4 Hours (Or PCP Triage)  Patient/caregiver understands and will follow disposition?: No, wishes to speak with PCP   Copied from CRM #[id number]. Topic: Clinical - Medication Question >> Sep 11, 2024 11:04 AM Avram MATSU wrote: Reason for CRM: husband is calling stating patient wanted to know she can get predniSONE  (DELTASONE ) 20 MG tablet [[id number]] . Please advise [phone] or [phone] [facility]  [facility] - [place], [place] - [address] [place] [address] Phone: [phone] Fax: [phone] Reason for Disposition  [1] SEVERE back pain (e.g., excruciating, unable to do any normal activities) AND [2] not improved 2 hours after pain medicine    Evaluated on 09/08/24, pain persisting. Chiropractor recommended prednisone  or other anti inflammatory by pcp.  Answer Assessment - Initial Assessment Questions Chiropractor today. He recommended she contact pcp for prednisone  or antiinflammatory.   Requesting for PCP to send prednisone  to [facility].    1. ONSET: When did the pain begin? (e.g., minutes, hours, days)     Friday 2. LOCATION: Where does it hurt? (upper, mid or lower back)     Lower back 3. SEVERITY: How bad is the pain?  (e.g., Scale 1-10; mild, moderate, or severe)     Not improved on Flexaril 4. PATTERN: Is the pain constant? (e.g., yes, no; constant,  intermittent)      constant 5. RADIATION: Does the pain shoot into your legs or somewhere else?     denies 6. CAUSE:  What do you think is causing the back pain?      Strong cough  7. BACK OVERUSE:  Any recent lifting of heavy objects, strenuous work or exercise?     Cough 8. MEDICINES: What have you taken so far for the pain? (e.g., nothing, acetaminophen , NSAIDS)     Flexaril, chiropractor  9. NEUROLOGIC SYMPTOMS: Do you have any weakness, numbness, or problems with bowel/bladder control?     denies 10. OTHER SYMPTOMS: Do you have any other symptoms? (e.g., fever, abdomen pain, burning with urination, blood in urine)       Dx with flu A on 09/08/24, symptoms are improving.  11. PREGNANCY: Is there any chance you are pregnant? When was your last menstrual period?  Protocols used: Back Pain-A-AH

## 2024-09-11 NOTE — Telephone Encounter (Signed)
 Rx sent to [facility]

## 2024-09-11 NOTE — Progress Notes (Signed)
 Acute Office Visit  Subjective:     Patient ID: Denise Jensen, female    DOB: 1956/05/18, 68 y.o.   MRN: [id number]  Chief Complaint  Patient presents with   Flu-like symptoms    Onset: Monday Meds Taken: Advil and Delsym    HPI  History of Present Illness   Denise Jensen is a 68 year old female who presents with gastrointestinal and respiratory symptoms.  Gastrointestinal symptoms - Stomach discomfort began three days ago - Bouts of diarrhea persisted for a couple of days. - Unable to eat due to symptoms. - Weight loss of four pounds over the past three days. - GI symptoms have started to subside over the last 24 hours  - Reports she is pushing fluid and gatorade as much as possible   Respiratory symptoms - Persistent, non-productive cough developed after gastrointestinal symptoms. - Cough triggers episodes of vomiting clear liquid. - Last vomiting episode occurred yesterday morning. - Sleep disrupted for three to four days due to cough.  Back pain - Severe back pain onset yesterday morning when her back 'cracked' loudly upon standing. - Pain is intense, especially when coughing. - No radiation of pain down the legs. Denies numbness or tingling - History of chronic back problems. - Receiving chiropractic therapy for back pain.  Medication allergies and preferences - Allergic to hydrocodone and codeine. - Prefers to avoid medications unless necessary.   ROS Per HPI     Objective:    BP 131/86   Pulse 94   Temp 98.4 F (36.9 C) (Oral)   Ht 5' 6.5 (1.689 m)   Wt 197 lb 1.9 oz (89.4 kg)   LMP  (LMP Unknown)   SpO2 95%   BMI 31.34 kg/m    Physical Exam Constitutional:      Appearance: She is ill-appearing.  HENT:     Right Ear: Tympanic membrane normal.     Left Ear: Tympanic membrane normal.     Nose: Congestion present.     Mouth/Throat:     Pharynx: Posterior oropharyngeal erythema present.  Eyes:     Pupils: Pupils are equal, round,  and reactive to light.  Cardiovascular:     Rate and Rhythm: Normal rate and regular rhythm.  Pulmonary:     Effort: Pulmonary effort is normal.     Breath sounds: Normal breath sounds.  Abdominal:     General: Bowel sounds are normal.  Musculoskeletal:        General: Normal range of motion.     Cervical back: Neck supple.     Lumbar back: Spasms and tenderness present.  Lymphadenopathy:     Cervical: No cervical adenopathy.  Skin:    General: Skin is warm and dry.  Neurological:     General: No focal deficit present.  Psychiatric:        Mood and Affect: Mood normal.        Behavior: Behavior normal.        Thought Content: Thought content normal.      Results for orders placed or performed in visit on 09/08/24  POC SOFIA 2 FLU + SARS ANTIGEN FIA  Result Value Ref Range   Influenza A, POC Positive (A) Negative   Influenza B, POC Negative Negative   SARS Coronavirus 2 Ag Negative Negative        Assessment & Plan:  Cough, unspecified type -     POC SOFIA 2 FLU + SARS ANTIGEN FIA  Influenza A  Assessment & Plan: POC flu swab positive for Flu A. Discussed viral etiology and will treat symptomatically. Promethazine  DM cough syrup to help with cough and improved sleep. Discussed that promethazine  may also aid in any lingering nausea.  Discussed fluids, rest, tylenol /ibuprofen, and other supportive care measures. Discussed use of Immodium if needed for diarrhea. Discussed importance of pushing plenty of fluids including water and gatorade. Discussed to follow up if sx worsen or fail to improve within 3-4 days.    Acute bilateral low back pain without sciatica Assessment & Plan: Severe pain without sciatic involvement. Chiropractic therapy initiated. Muscle relaxer chosen over prednisone  due to risk of GI side effects given patient is recovering from acute GI upset secondary to flu.  - Prescribed cyclobenzaprine  5 mg for muscle relaxation. - Advised alternating doses of  cough syrup and muscle relaxer to avoid simultaneous use and over sedation.  - Patient verbalized understanding and was in agreement with the plan.      Other orders -     Promethazine -DM; Take 5 mLs by mouth 4 (four) times daily as needed.  Dispense: 118 mL; Refill: 0 -     Cyclobenzaprine  HCl; Take 1 tablet (5 mg total) by mouth 3 (three) times daily as needed for muscle spasms.  Dispense: 15 tablet; Refill: 0     Return if symptoms worsen or fail to improve.  Denise JULIANNA Sacks, PA-C

## 2024-09-11 NOTE — Assessment & Plan Note (Signed)
 Severe pain without sciatic involvement. Chiropractic therapy initiated. Muscle relaxer chosen over prednisone  due to risk of GI side effects given patient is recovering from acute GI upset secondary to flu.  - Prescribed cyclobenzaprine  5 mg for muscle relaxation. - Advised alternating doses of cough syrup and muscle relaxer to avoid simultaneous use and over sedation.  - Patient verbalized understanding and was in agreement with the plan.

## 2024-09-11 NOTE — Assessment & Plan Note (Signed)
 POC flu swab positive for Flu A. Discussed viral etiology and will treat symptomatically. Promethazine  DM cough syrup to help with cough and improved sleep. Discussed that promethazine  may also aid in any lingering nausea.  Discussed fluids, rest, tylenol /ibuprofen, and other supportive care measures. Discussed use of Immodium if needed for diarrhea. Discussed importance of pushing plenty of fluids including water and gatorade. Discussed to follow up if sx worsen or fail to improve within 3-4 days.

## 2024-09-26 ENCOUNTER — Other Ambulatory Visit: Payer: Self-pay | Admitting: Nurse Practitioner

## 2024-09-26 DIAGNOSIS — E039 Hypothyroidism, unspecified: Secondary | ICD-10-CM

## 2024-09-27 ENCOUNTER — Other Ambulatory Visit: Payer: Self-pay

## 2024-09-27 DIAGNOSIS — E039 Hypothyroidism, unspecified: Secondary | ICD-10-CM

## 2024-12-19 ENCOUNTER — Other Ambulatory Visit

## 2024-12-26 ENCOUNTER — Encounter: Admitting: Family Medicine

## 2024-12-26 ENCOUNTER — Other Ambulatory Visit

## 2025-11-08 ENCOUNTER — Ambulatory Visit
